# Patient Record
Sex: Female | Born: 1970 | Race: Black or African American | Hispanic: No | Marital: Single | Smoking: Never smoker
Health system: Southern US, Community
[De-identification: ages and names within clinical notes are randomized; demographics above are authoritative.]

## PROBLEM LIST (undated history)

## (undated) DIAGNOSIS — I10 Essential (primary) hypertension: Secondary | ICD-10-CM

## (undated) DIAGNOSIS — R102 Pelvic and perineal pain: Secondary | ICD-10-CM

## (undated) DIAGNOSIS — D219 Benign neoplasm of connective and other soft tissue, unspecified: Secondary | ICD-10-CM

## (undated) DIAGNOSIS — B009 Herpesviral infection, unspecified: Secondary | ICD-10-CM

## (undated) DIAGNOSIS — J189 Pneumonia, unspecified organism: Secondary | ICD-10-CM

## (undated) DIAGNOSIS — E119 Type 2 diabetes mellitus without complications: Secondary | ICD-10-CM

## (undated) DIAGNOSIS — N939 Abnormal uterine and vaginal bleeding, unspecified: Secondary | ICD-10-CM

## (undated) DIAGNOSIS — E039 Hypothyroidism, unspecified: Secondary | ICD-10-CM

## (undated) HISTORY — DX: Herpesviral infection, unspecified: B00.9

## (undated) HISTORY — PX: CRYOABLATION: SHX1415

---

## 1997-02-18 HISTORY — PX: TUBAL LIGATION: SHX77

## 1997-06-15 ENCOUNTER — Encounter: Admission: RE | Admit: 1997-06-15 | Discharge: 1997-06-15 | Payer: Self-pay | Admitting: Family Medicine

## 1998-04-06 ENCOUNTER — Encounter: Admission: RE | Admit: 1998-04-06 | Discharge: 1998-04-06 | Payer: Self-pay | Admitting: Family Medicine

## 1998-08-25 ENCOUNTER — Encounter: Admission: RE | Admit: 1998-08-25 | Discharge: 1998-08-25 | Payer: Self-pay | Admitting: Family Medicine

## 1998-10-10 ENCOUNTER — Encounter: Admission: RE | Admit: 1998-10-10 | Discharge: 1998-10-10 | Payer: Self-pay | Admitting: Family Medicine

## 1998-10-10 ENCOUNTER — Other Ambulatory Visit: Admission: RE | Admit: 1998-10-10 | Discharge: 1998-10-10 | Payer: Self-pay | Admitting: *Deleted

## 1999-02-05 ENCOUNTER — Encounter: Admission: RE | Admit: 1999-02-05 | Discharge: 1999-02-05 | Payer: Self-pay | Admitting: Family Medicine

## 1999-03-29 ENCOUNTER — Encounter: Admission: RE | Admit: 1999-03-29 | Discharge: 1999-03-29 | Payer: Self-pay | Admitting: Family Medicine

## 1999-04-04 ENCOUNTER — Encounter: Admission: RE | Admit: 1999-04-04 | Discharge: 1999-04-04 | Payer: Self-pay | Admitting: Family Medicine

## 1999-05-03 ENCOUNTER — Encounter: Admission: RE | Admit: 1999-05-03 | Discharge: 1999-05-03 | Payer: Self-pay | Admitting: Family Medicine

## 1999-05-09 ENCOUNTER — Encounter: Admission: RE | Admit: 1999-05-09 | Discharge: 1999-05-09 | Payer: Self-pay | Admitting: Family Medicine

## 2000-03-20 ENCOUNTER — Encounter: Admission: RE | Admit: 2000-03-20 | Discharge: 2000-03-20 | Payer: Self-pay | Admitting: Family Medicine

## 2000-04-25 ENCOUNTER — Encounter: Admission: RE | Admit: 2000-04-25 | Discharge: 2000-04-25 | Payer: Self-pay | Admitting: Family Medicine

## 2006-03-21 ENCOUNTER — Encounter (INDEPENDENT_AMBULATORY_CARE_PROVIDER_SITE_OTHER): Payer: Self-pay | Admitting: *Deleted

## 2006-03-21 LAB — CONVERTED CEMR LAB

## 2006-03-27 ENCOUNTER — Encounter (INDEPENDENT_AMBULATORY_CARE_PROVIDER_SITE_OTHER): Payer: Self-pay | Admitting: Family Medicine

## 2006-03-27 ENCOUNTER — Ambulatory Visit: Payer: Self-pay | Admitting: Family Medicine

## 2006-03-27 LAB — CONVERTED CEMR LAB
ALT: 10 units/L (ref 0–35)
Albumin: 4.2 g/dL (ref 3.5–5.2)
Alkaline Phosphatase: 50 units/L (ref 39–117)
CO2: 22 meq/L (ref 19–32)
Cholesterol: 150 mg/dL (ref 0–200)
GC Probe Amp, Genital: NEGATIVE
Glucose, Bld: 89 mg/dL (ref 70–99)
LDL Cholesterol: 92 mg/dL (ref 0–99)
Potassium: 4.1 meq/L (ref 3.5–5.3)
Sodium: 137 meq/L (ref 135–145)
Total Bilirubin: 0.2 mg/dL — ABNORMAL LOW (ref 0.3–1.2)
Total Protein: 6.7 g/dL (ref 6.0–8.3)
Triglycerides: 37 mg/dL (ref <150)
VLDL: 7 mg/dL (ref 0–40)

## 2006-04-17 DIAGNOSIS — E0500 Thyrotoxicosis with diffuse goiter without thyrotoxic crisis or storm: Secondary | ICD-10-CM | POA: Insufficient documentation

## 2006-04-18 ENCOUNTER — Encounter (INDEPENDENT_AMBULATORY_CARE_PROVIDER_SITE_OTHER): Payer: Self-pay | Admitting: *Deleted

## 2006-09-11 ENCOUNTER — Encounter: Admission: RE | Admit: 2006-09-11 | Discharge: 2006-09-11 | Payer: Self-pay | Admitting: Internal Medicine

## 2006-09-11 IMAGING — CR DG KNEE COMPLETE 4+V*R*
4 series · 4 of 4 positions shown · non-contrast
Comparison: none

HISTORY: Pain, fall

RIGHT KNEE 4 VIEWS:
Bone mineralization normal.
Joint spaces preserved.
No fracture, dislocation, or bone destruction.
No knee joint effusion seen.
Soft tissues unremarkable.

[view not recorded (1 of 4)]
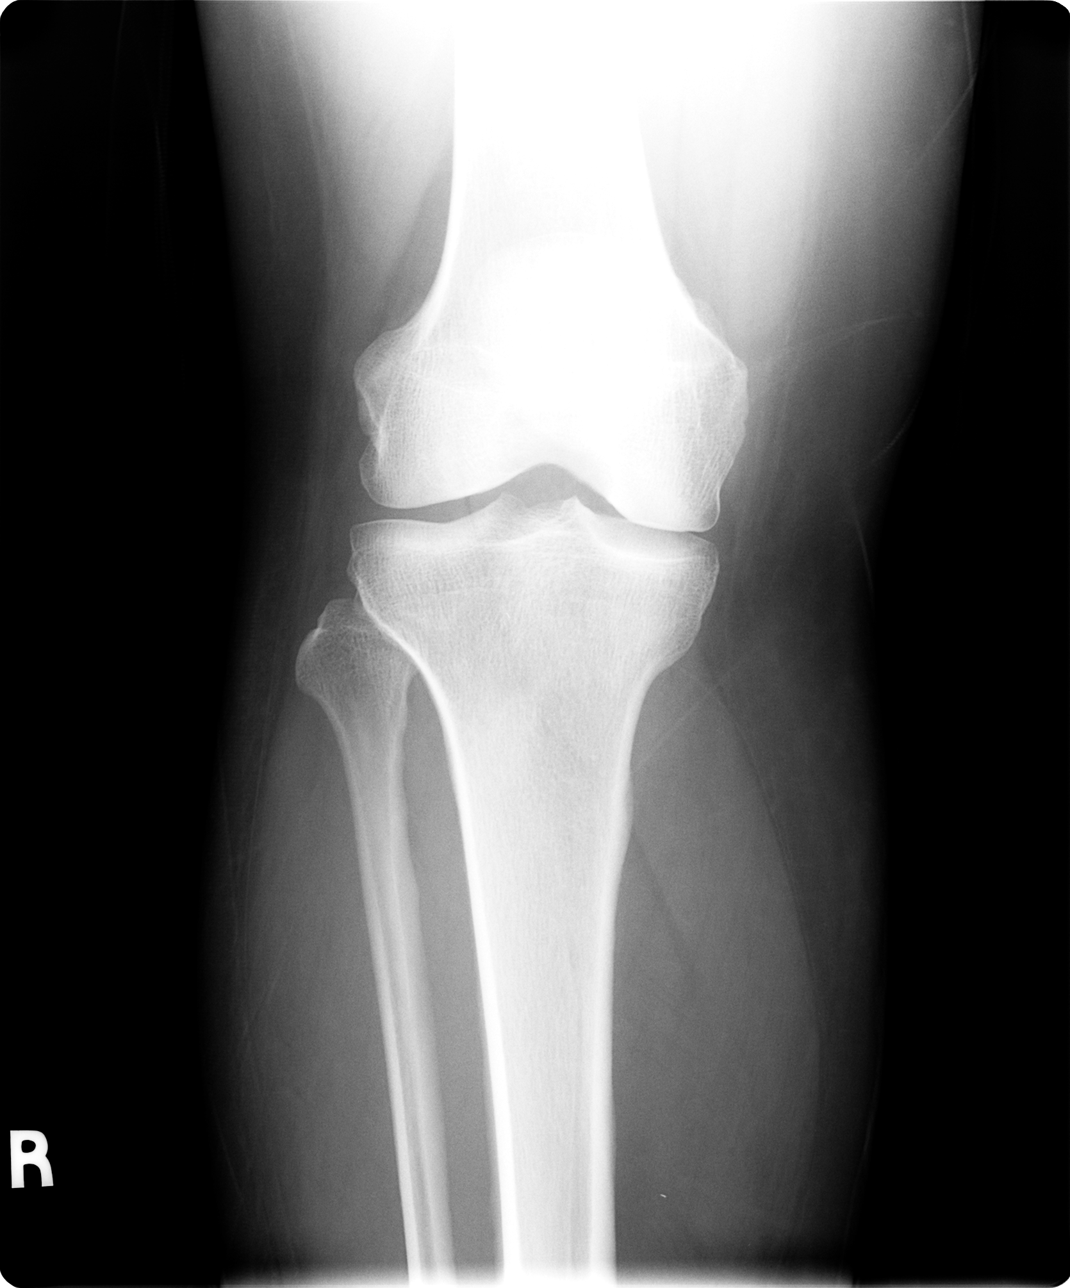

[view not recorded (2 of 4)]
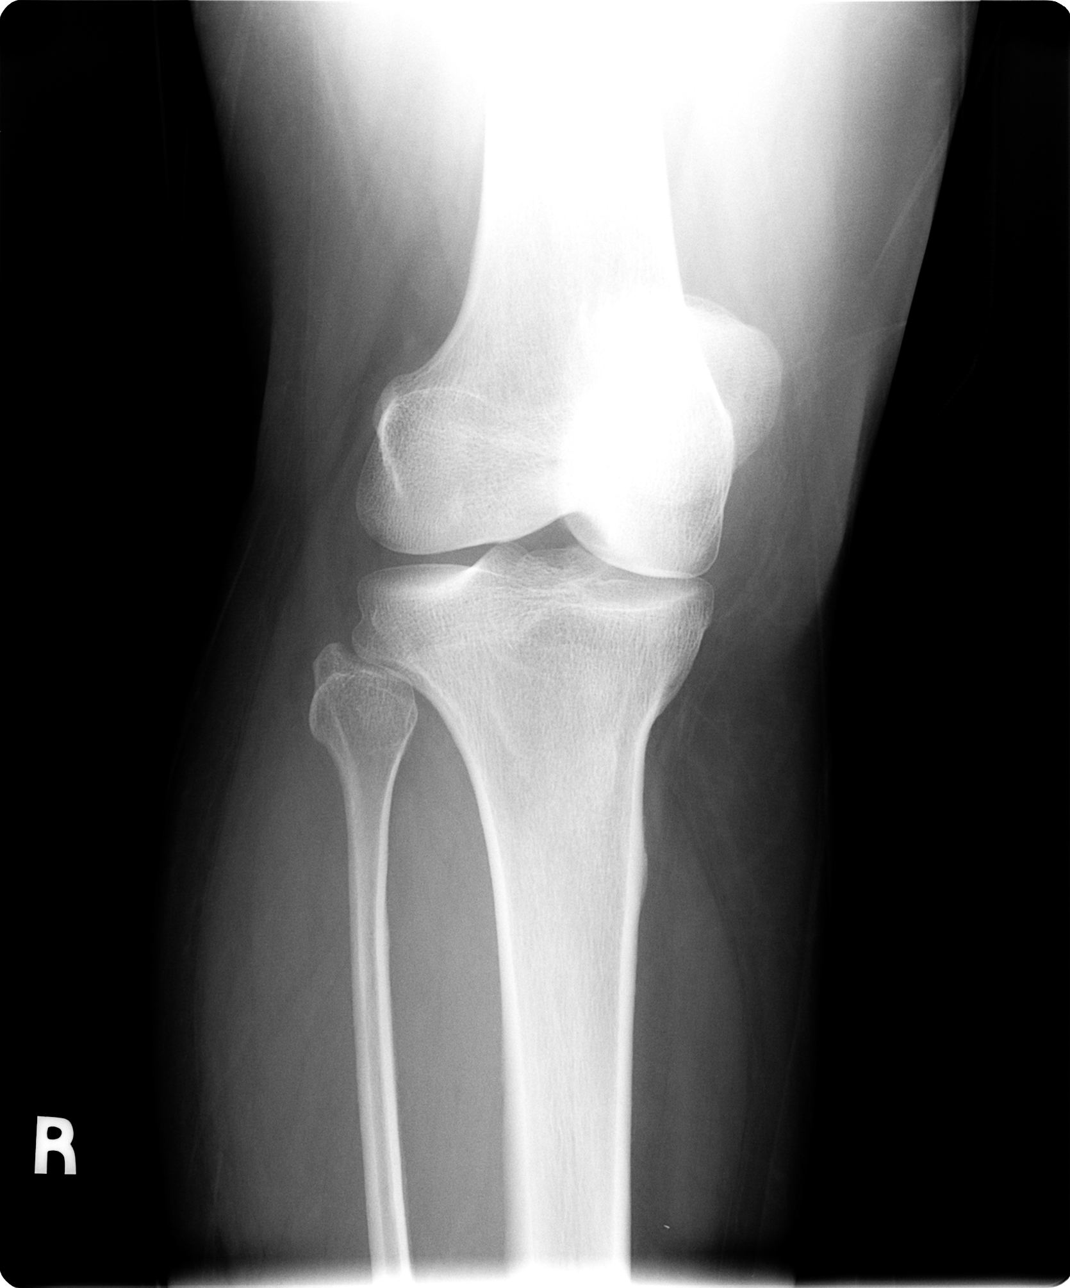

[view not recorded (3 of 4)]
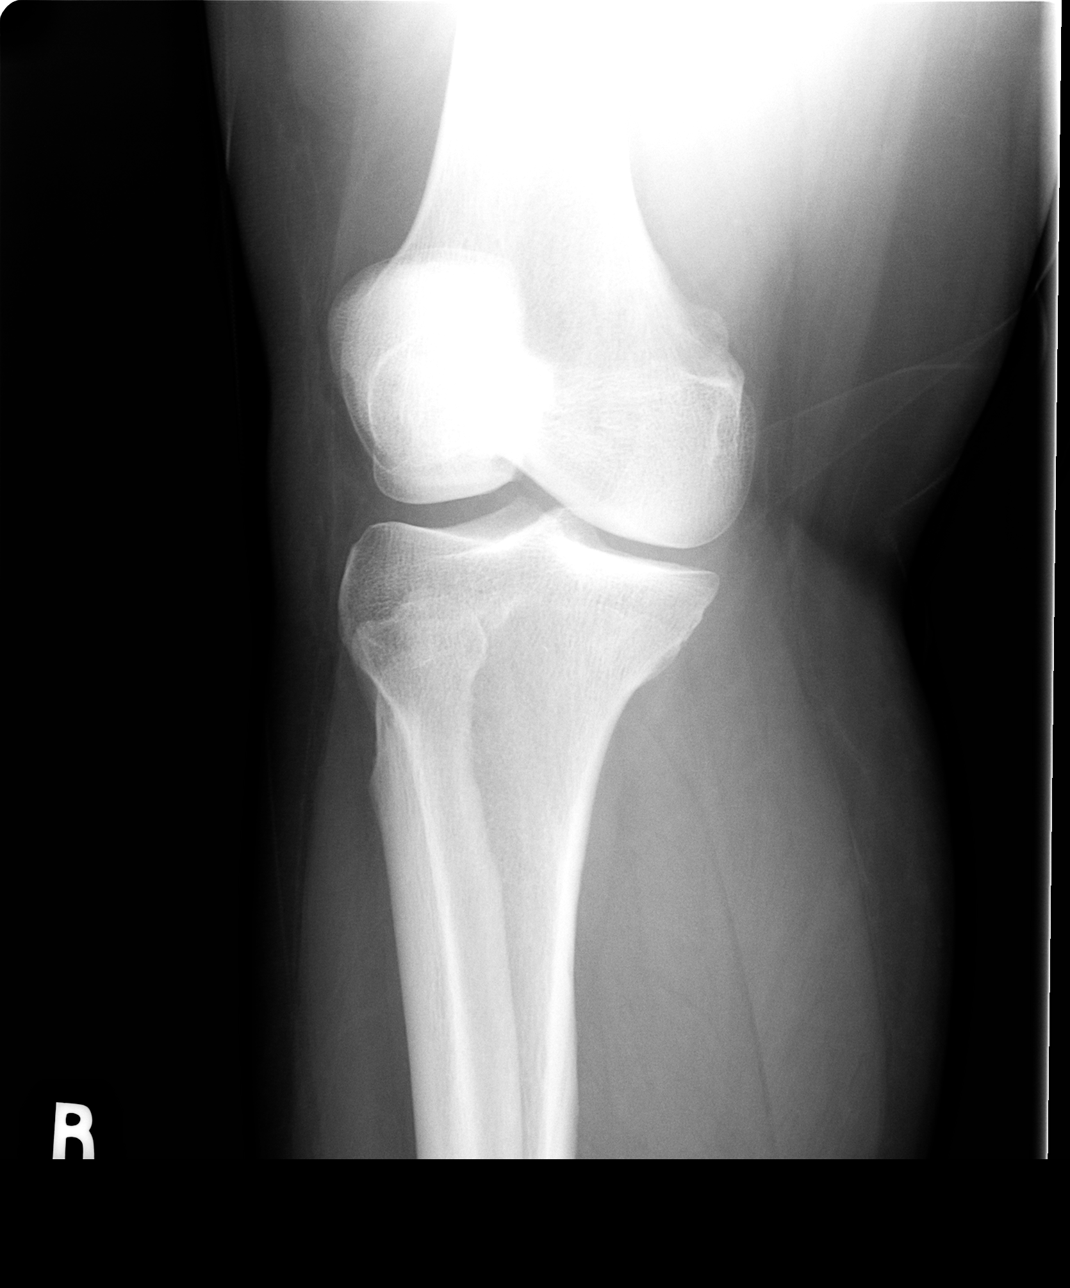

[view not recorded (4 of 4)]
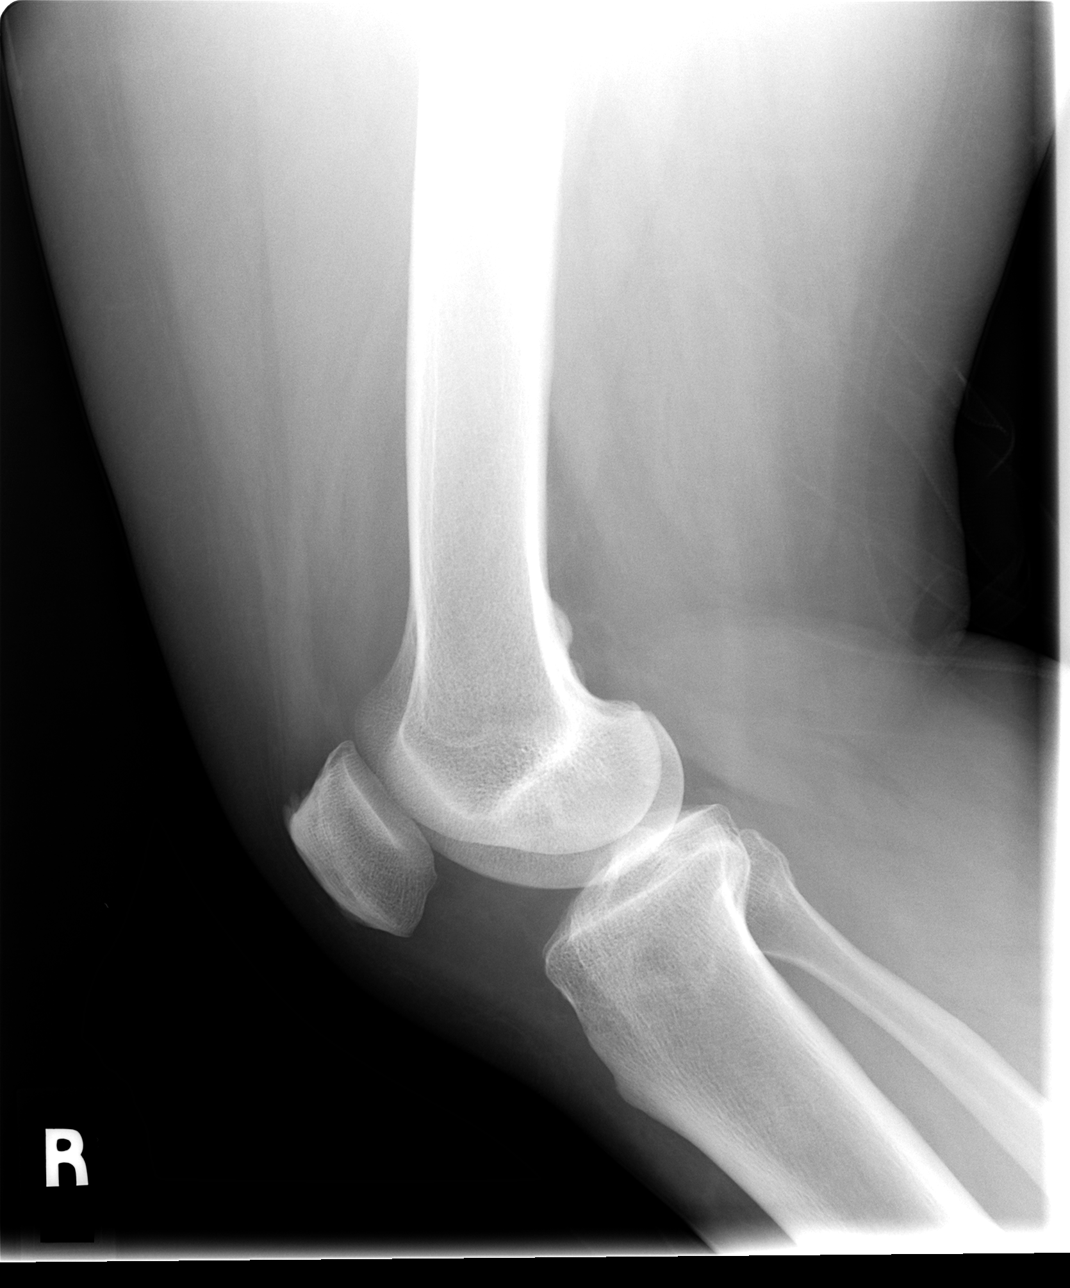

[4 of 4 positions shown; findings below may reference images not displayed]

IMPRESSION: No acute abnormalities.

## 2006-10-13 ENCOUNTER — Ambulatory Visit (HOSPITAL_COMMUNITY): Admission: RE | Admit: 2006-10-13 | Discharge: 2006-10-13 | Payer: Self-pay | Admitting: Family Medicine

## 2006-10-13 ENCOUNTER — Ambulatory Visit: Payer: Self-pay | Admitting: Family Medicine

## 2006-10-13 ENCOUNTER — Telehealth: Payer: Self-pay | Admitting: *Deleted

## 2006-10-13 DIAGNOSIS — R0789 Other chest pain: Secondary | ICD-10-CM | POA: Insufficient documentation

## 2006-10-14 ENCOUNTER — Encounter: Payer: Self-pay | Admitting: Family Medicine

## 2006-10-25 ENCOUNTER — Emergency Department (HOSPITAL_COMMUNITY): Admission: EM | Admit: 2006-10-25 | Discharge: 2006-10-25 | Payer: Self-pay | Admitting: Family Medicine

## 2006-10-25 IMAGING — CR DG ORBITS COMPLETE 4+V
3 series · 3 of 3 positions shown · non-contrast
Comparison: none

CLINICAL DATA: Punched and left

Exam: Orbits:

[view not recorded (1 of 3)]
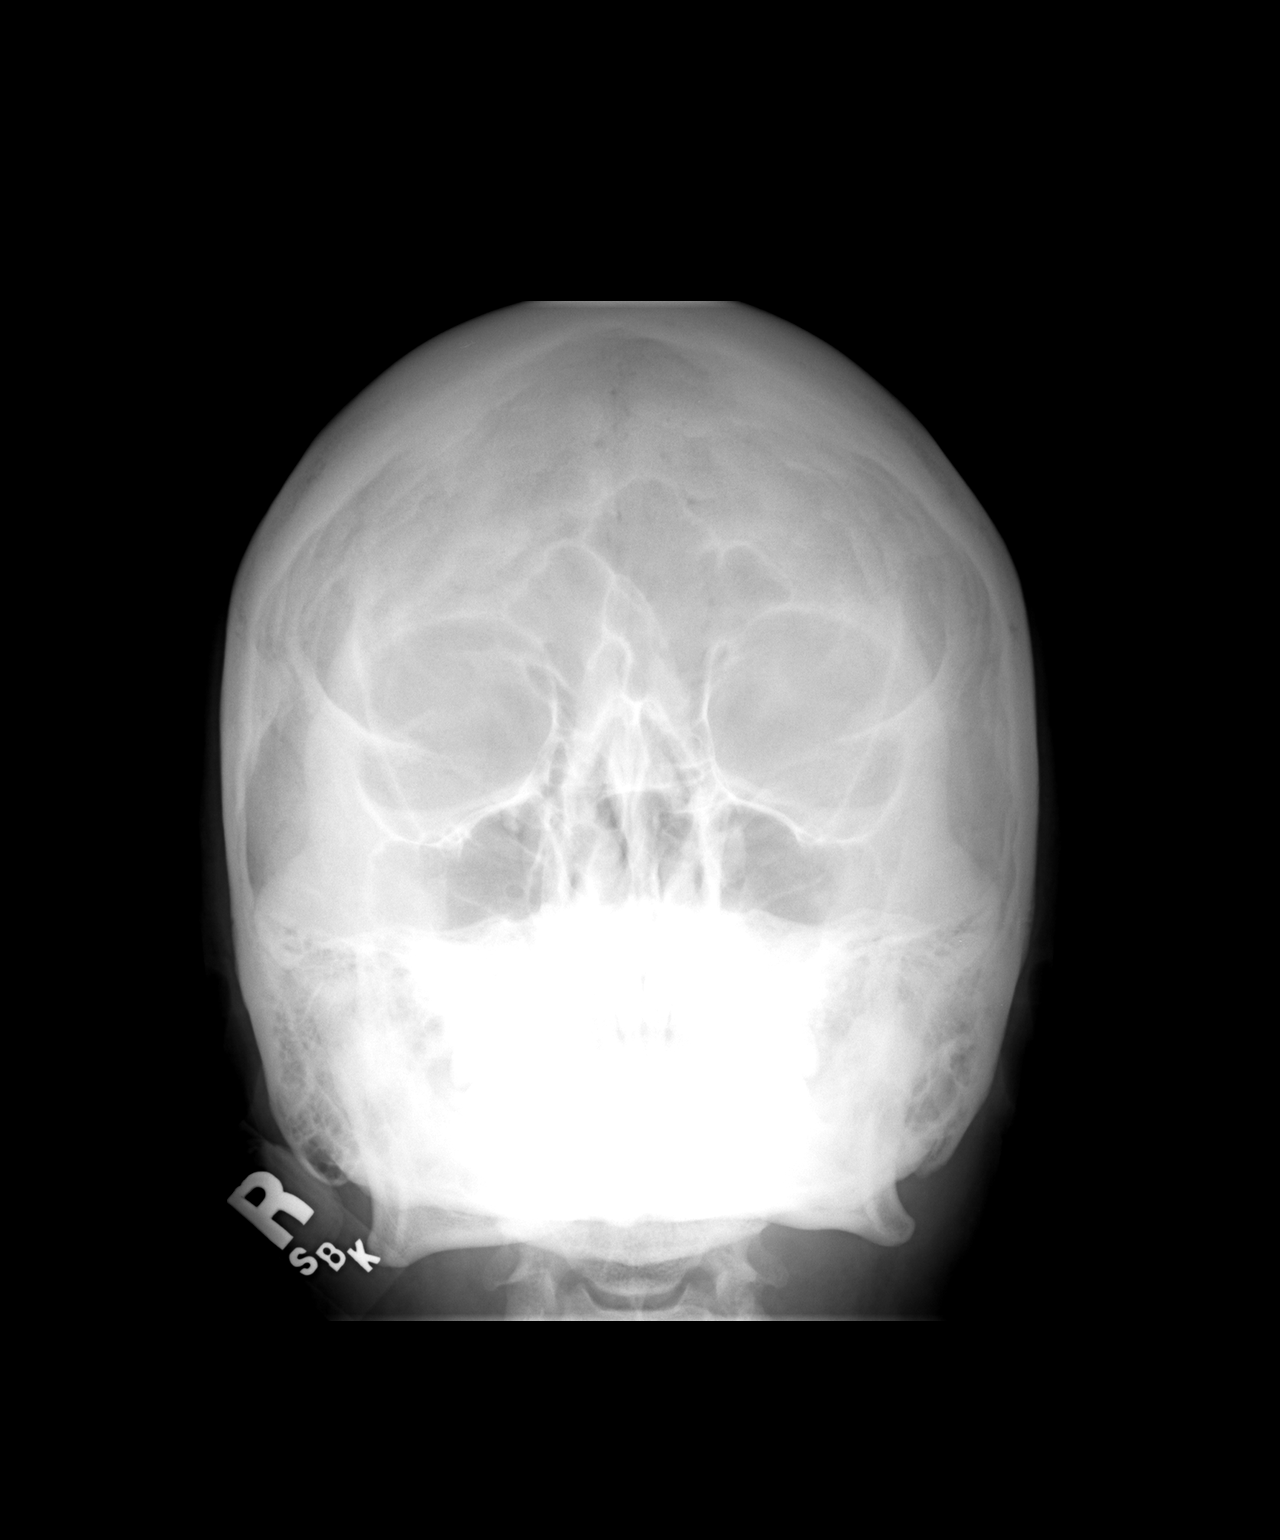

[view not recorded (2 of 3)]
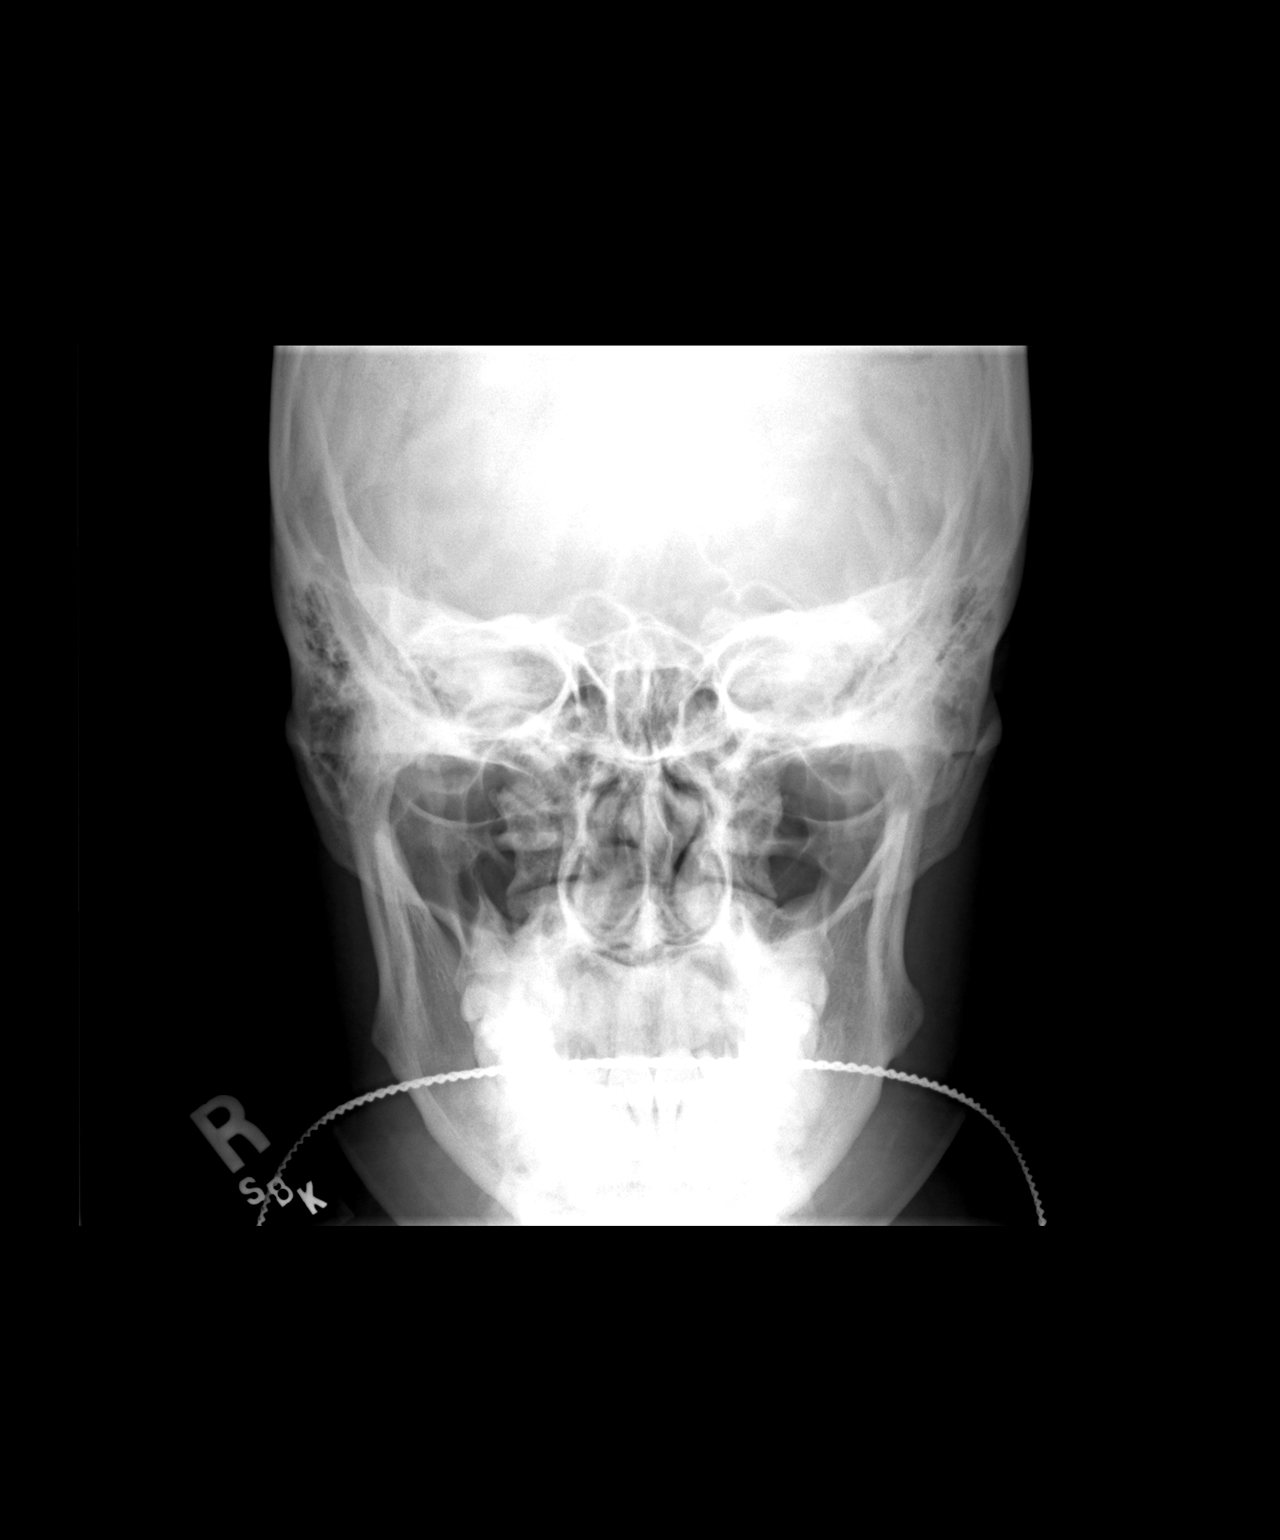

[view not recorded (3 of 3)]
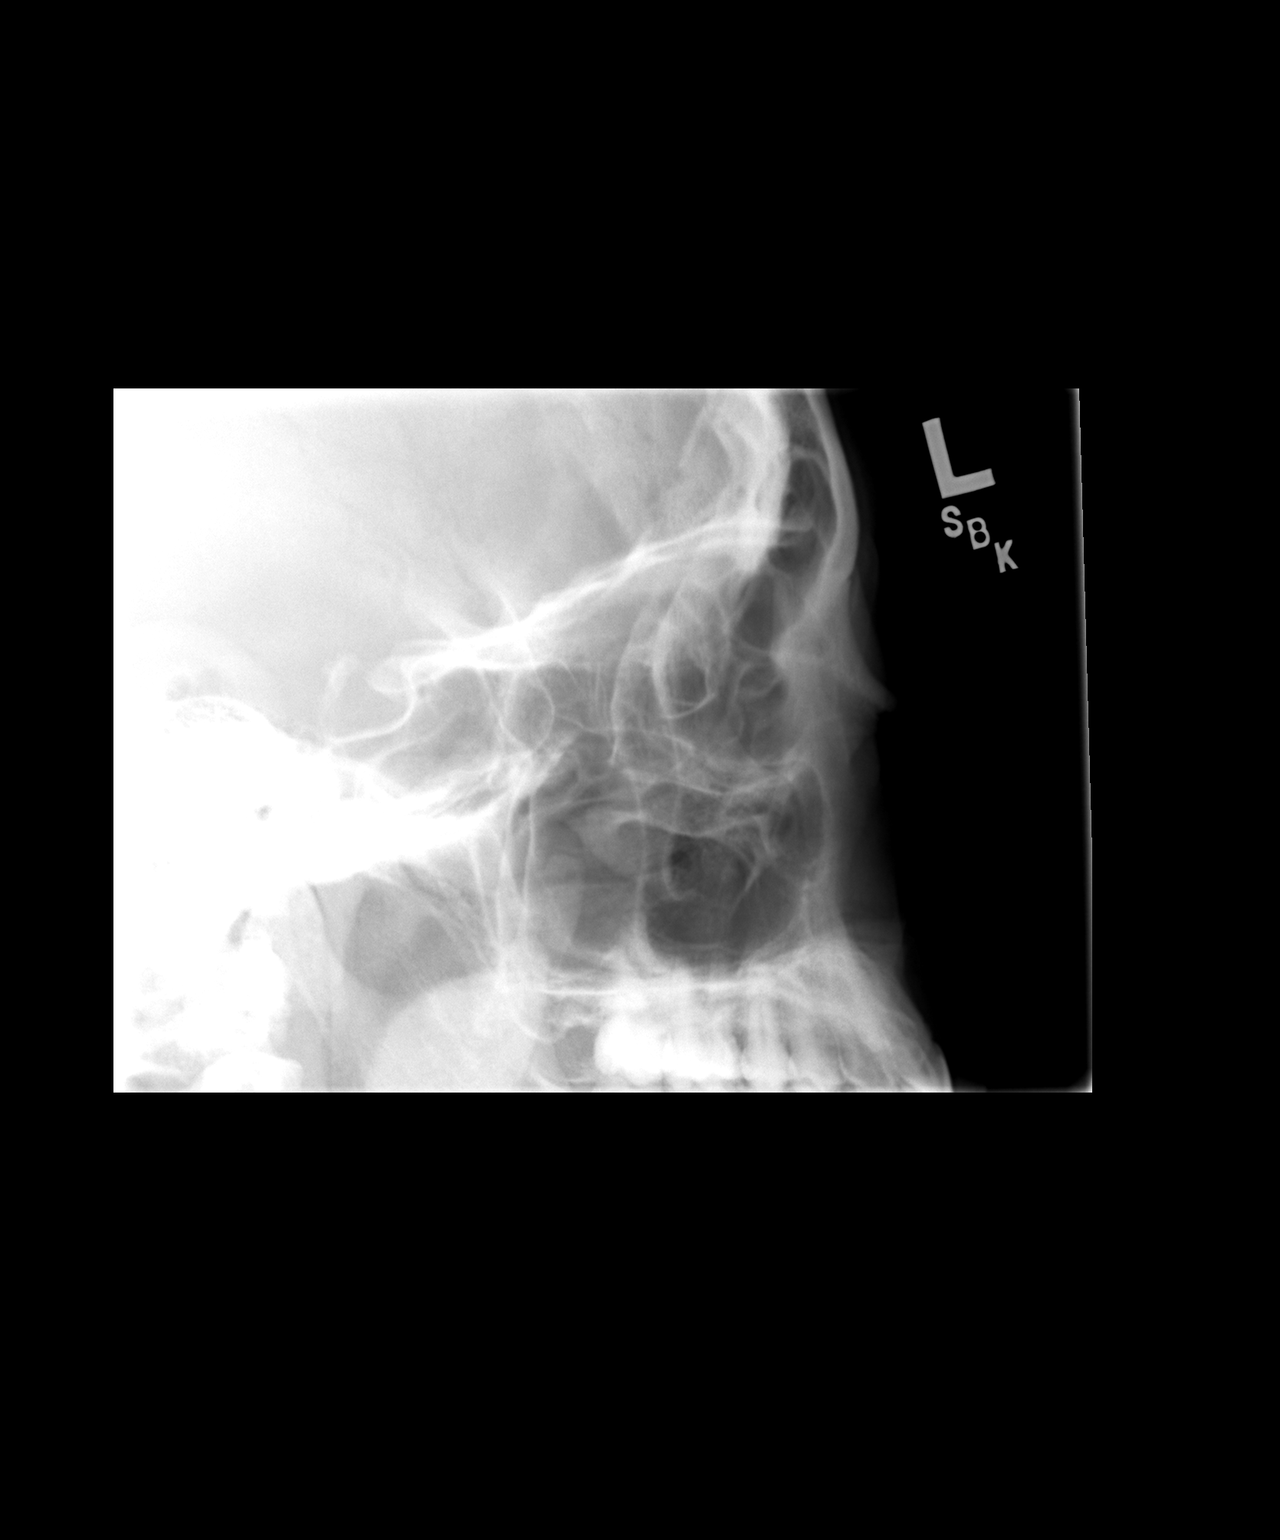

[3 of 3 positions shown; findings below may reference images not displayed]

FINDINGS: 3 views of the orbit demonstrate no evidence of fracture or
dislocation. No evidence of fluid within the paranasal sinuses.
IMPRESSION: 1. No evidence of fracture.

## 2007-12-16 ENCOUNTER — Encounter: Payer: Self-pay | Admitting: Family Medicine

## 2007-12-21 ENCOUNTER — Encounter: Payer: Self-pay | Admitting: Family Medicine

## 2007-12-21 ENCOUNTER — Ambulatory Visit: Payer: Self-pay | Admitting: Family Medicine

## 2007-12-21 DIAGNOSIS — E669 Obesity, unspecified: Secondary | ICD-10-CM | POA: Insufficient documentation

## 2007-12-21 DIAGNOSIS — I10 Essential (primary) hypertension: Secondary | ICD-10-CM | POA: Insufficient documentation

## 2007-12-21 LAB — CONVERTED CEMR LAB
ALT: <8 units/L (ref 0–35)
Albumin: 4.2 g/dL (ref 3.5–5.2)
Alkaline Phosphatase: 38 units/L — ABNORMAL LOW (ref 39–117)
Glucose, Bld: 100 mg/dL — ABNORMAL HIGH (ref 70–99)
LDL Cholesterol: 86 mg/dL (ref 0–99)
Potassium: 3.7 meq/L (ref 3.5–5.3)
Sodium: 135 meq/L (ref 135–145)
Total Bilirubin: 0.3 mg/dL (ref 0.3–1.2)
Total Protein: 6.9 g/dL (ref 6.0–8.3)
VLDL: 16 mg/dL (ref 0–40)

## 2007-12-28 ENCOUNTER — Encounter: Payer: Self-pay | Admitting: Family Medicine

## 2008-09-13 ENCOUNTER — Telehealth: Payer: Self-pay | Admitting: Family Medicine

## 2008-10-20 ENCOUNTER — Encounter: Payer: Self-pay | Admitting: Family Medicine

## 2008-10-20 ENCOUNTER — Ambulatory Visit: Payer: Self-pay | Admitting: Family Medicine

## 2008-10-20 LAB — CONVERTED CEMR LAB
CO2: 23 meq/L (ref 19–32)
Chloride: 106 meq/L (ref 96–112)
Potassium: 3.9 meq/L (ref 3.5–5.3)
Sodium: 138 meq/L (ref 135–145)

## 2008-12-13 ENCOUNTER — Encounter: Admission: RE | Admit: 2008-12-13 | Discharge: 2008-12-13 | Payer: Self-pay | Admitting: *Deleted

## 2008-12-13 IMAGING — MG MM SCREEN MAMMOGRAM BILATERAL
5 series · 5 of 5 positions shown · non-contrast
Comparison: none

DG SCREEN MAMMOGRAM BILATERAL
Bilateral CC and MLO view(s) were taken.

DIGITAL SCREENING MAMMOGRAM WITH CAD:
The breast tissue is heterogeneously dense.  There is no dominant mass, architectural distortion or
calcification to suggest malignancy.
Images were processed with CAD.

[R CC]
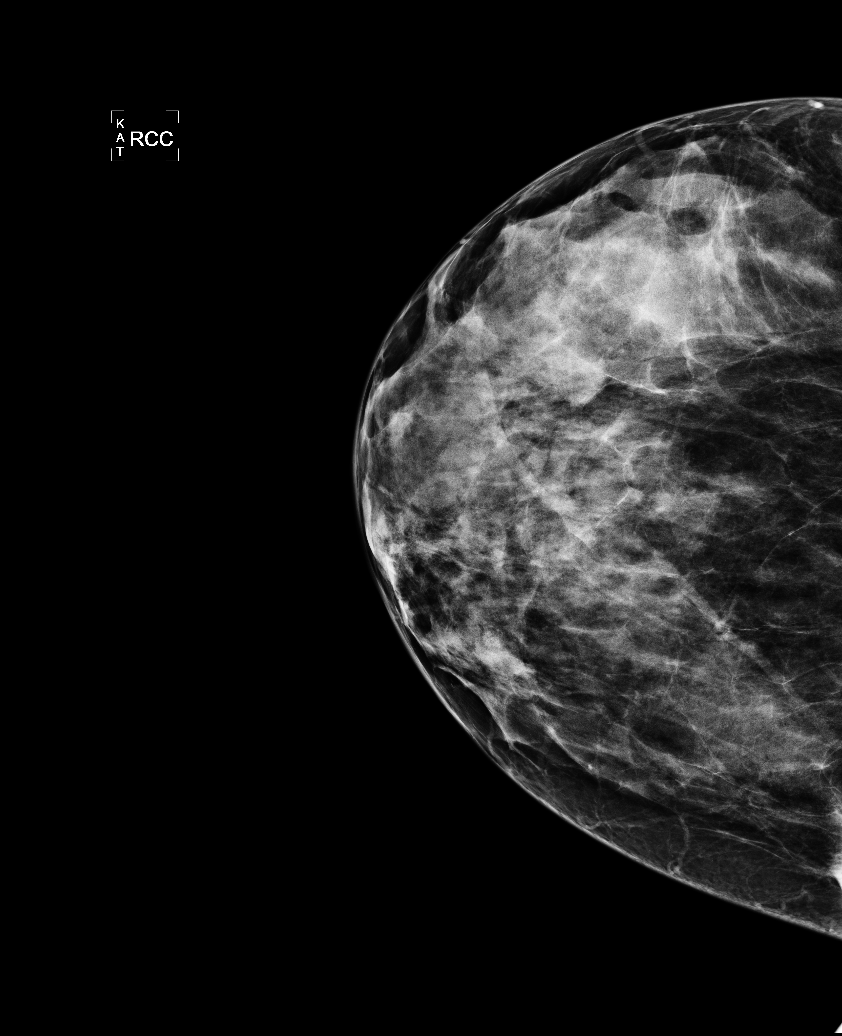

[L CC]
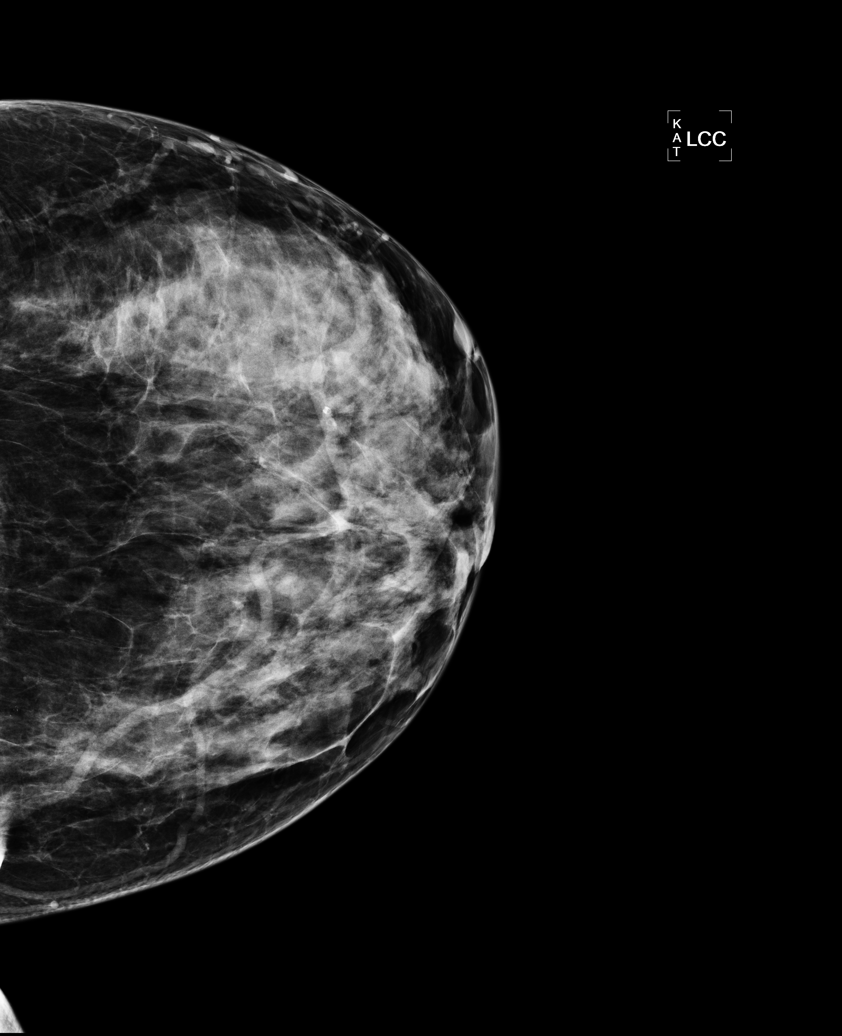

[L MLO]
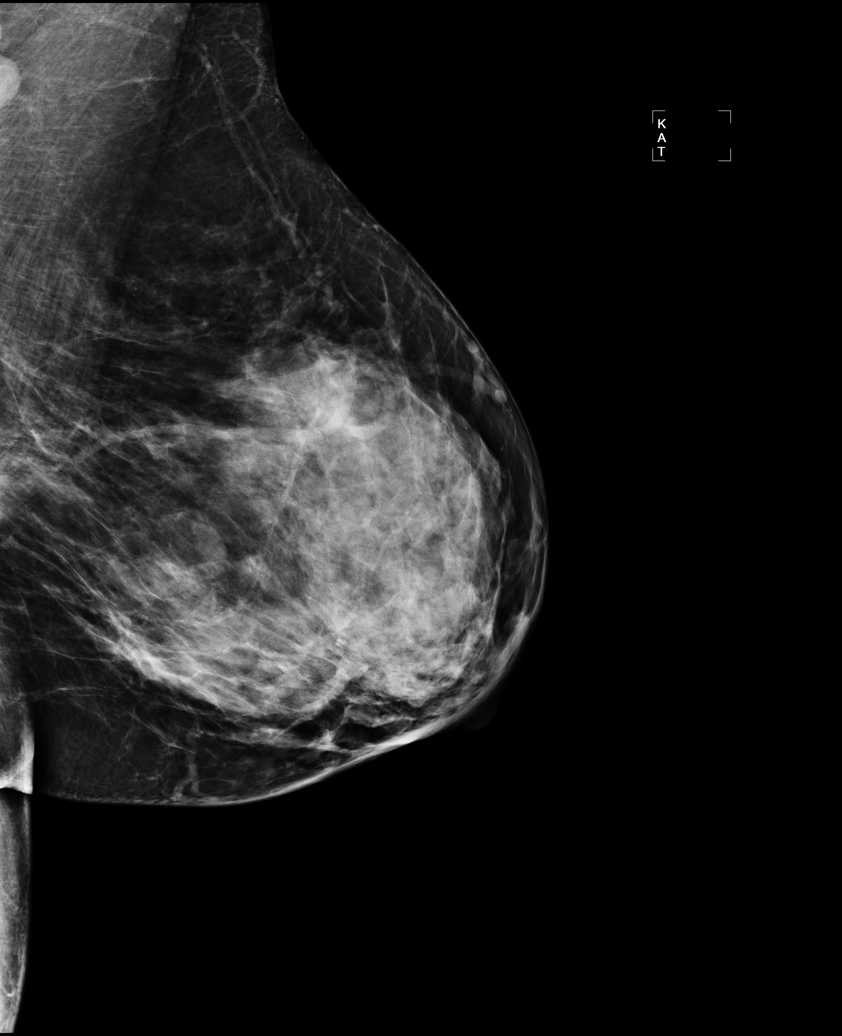

[R MLO (1 of 2)]
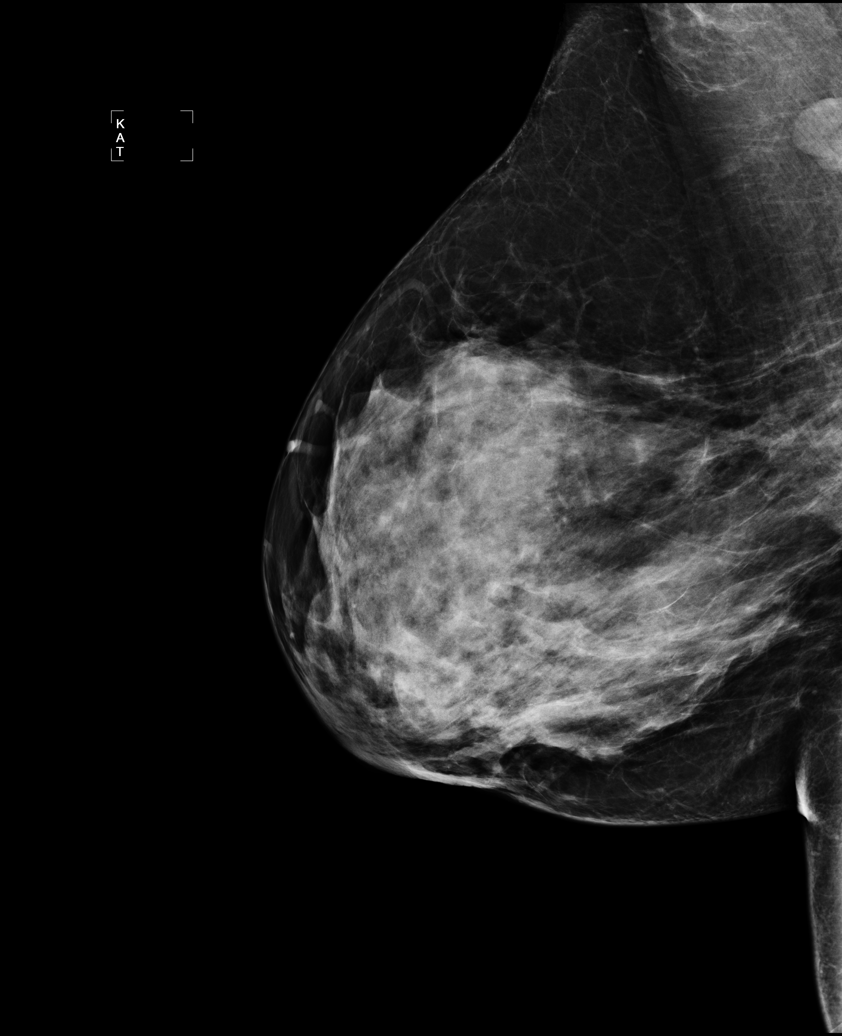

[R MLO (2 of 2)]
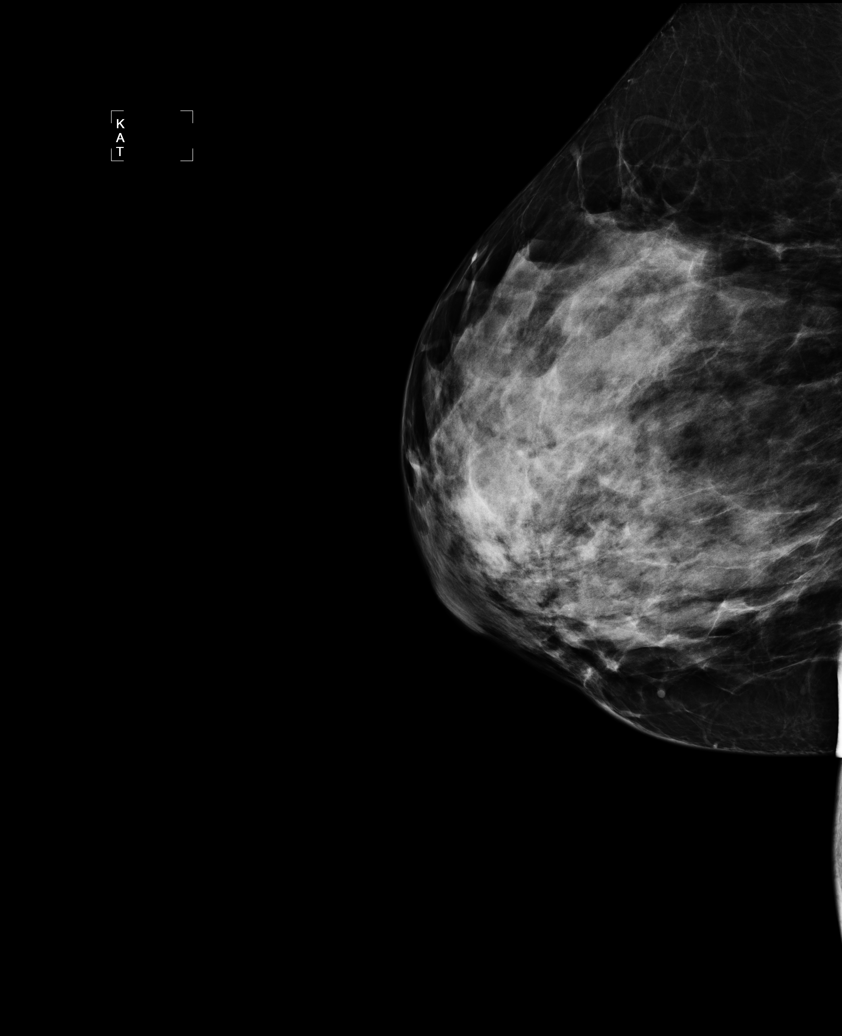

[5 of 5 positions shown; findings below may reference images not displayed]

IMPRESSION: No mammographic evidence of malignancy.  Suggest yearly screening mammography begin at age 40.

A result letter of this screening mammogram will be mailed directly to the patient.

ASSESSMENT: Negative - BI-RADS 1

Screening mammogram of both breasts at age 40.
,

## 2009-01-02 ENCOUNTER — Telehealth: Payer: Self-pay | Admitting: Family Medicine

## 2009-02-18 DIAGNOSIS — B009 Herpesviral infection, unspecified: Secondary | ICD-10-CM

## 2009-02-18 HISTORY — DX: Herpesviral infection, unspecified: B00.9

## 2009-11-14 ENCOUNTER — Ambulatory Visit: Payer: Self-pay | Admitting: Family Medicine

## 2009-11-14 DIAGNOSIS — N6320 Unspecified lump in the left breast, unspecified quadrant: Secondary | ICD-10-CM | POA: Insufficient documentation

## 2009-11-15 ENCOUNTER — Encounter: Admission: RE | Admit: 2009-11-15 | Discharge: 2009-11-15 | Payer: Self-pay | Admitting: Family Medicine

## 2009-11-15 IMAGING — MG MM DIGITAL DIAGNOSTIC BILAT
7 series · 7 of 7 positions shown · non-contrast
Comparison: [DATE]

CLINICAL DATA: Palpable abnormalities in both breasts.

DIGITAL DIAGNOSTIC BILATERAL MAMMOGRAM CAD AND BILATERAL BREAST
ULTRASOUND:

[R CC]
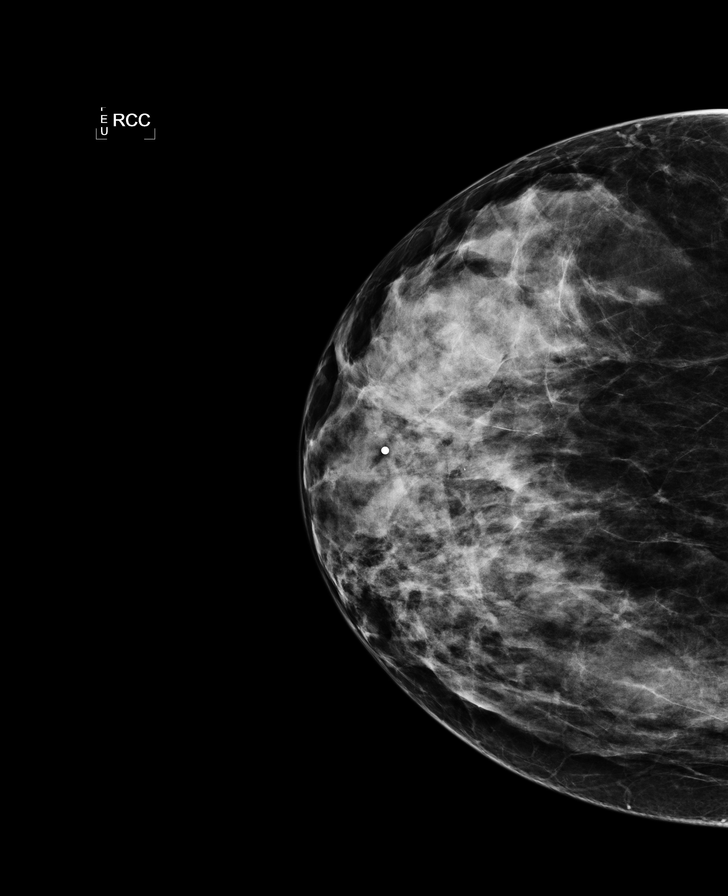

[L CC (1 of 2)]
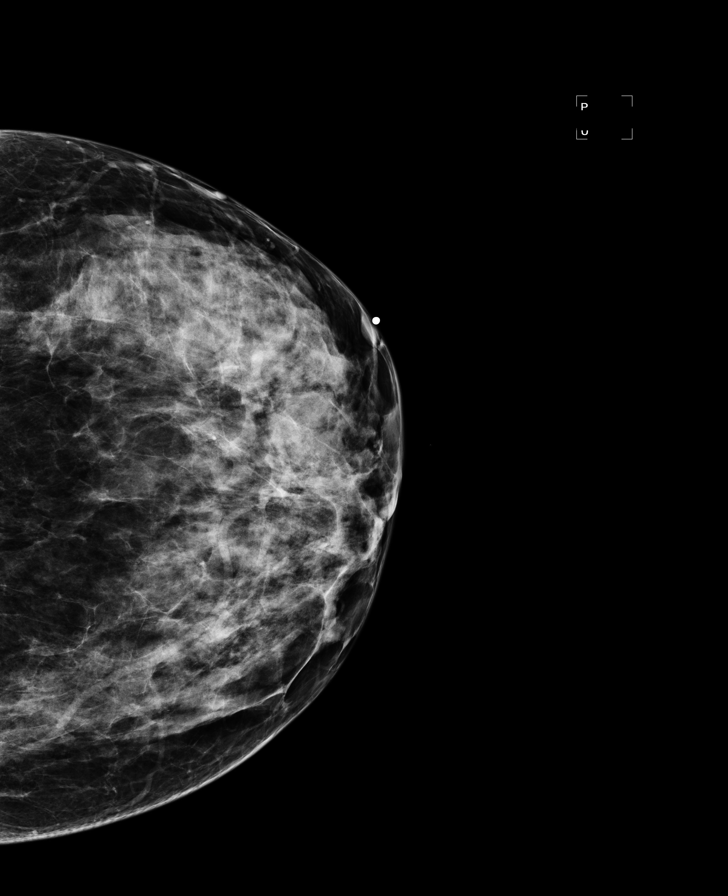

[L MLO]
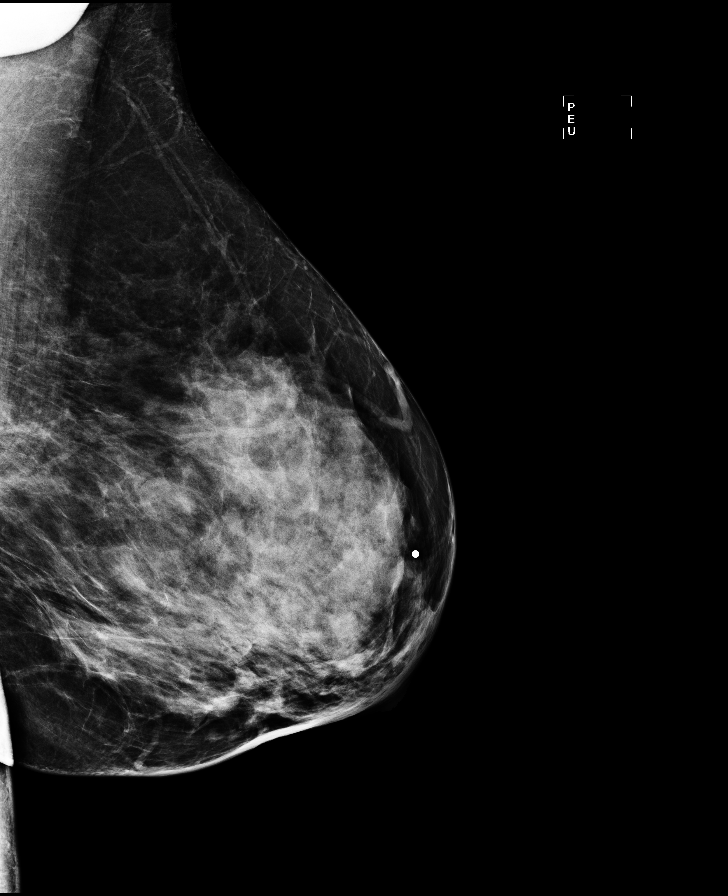

[R MLO]
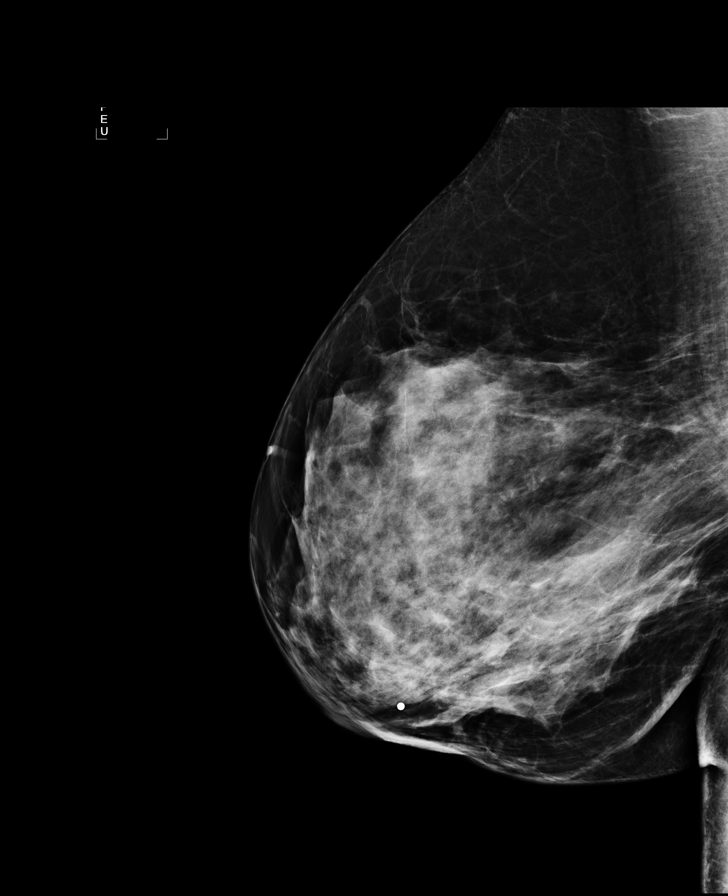

[L CC (2 of 2)]
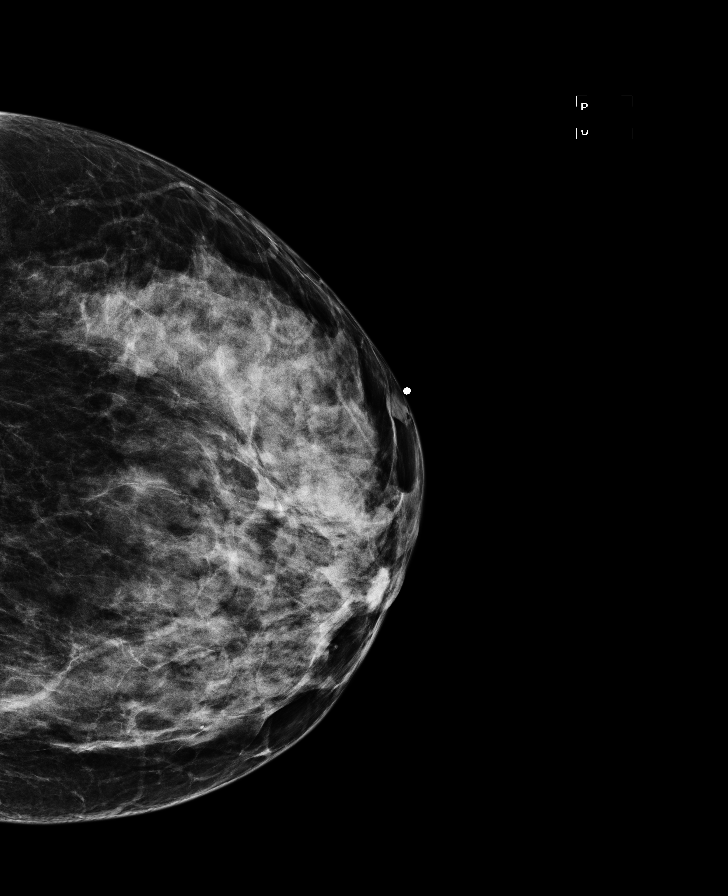

[L TAN]
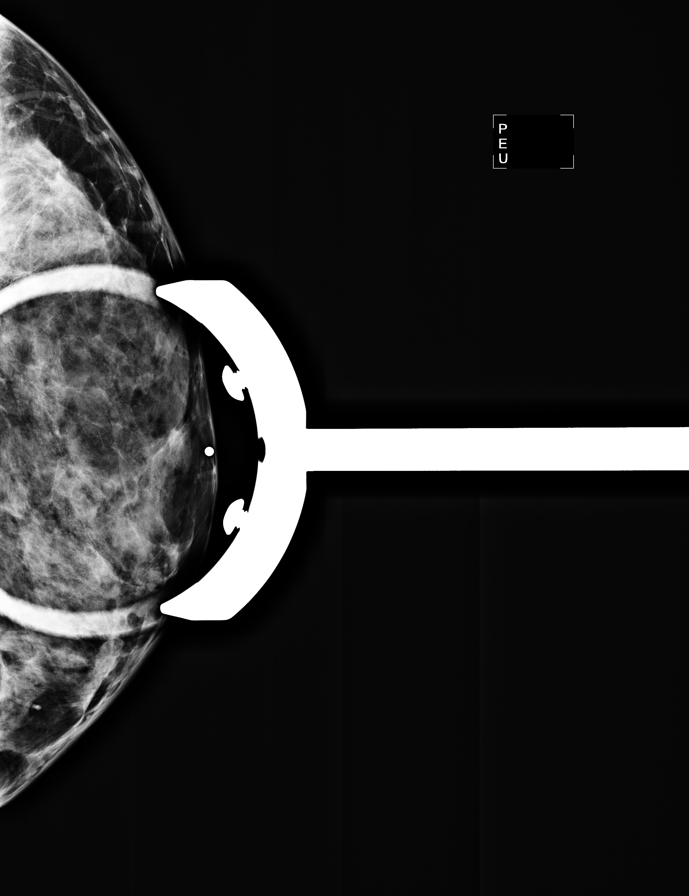

[R TAN]
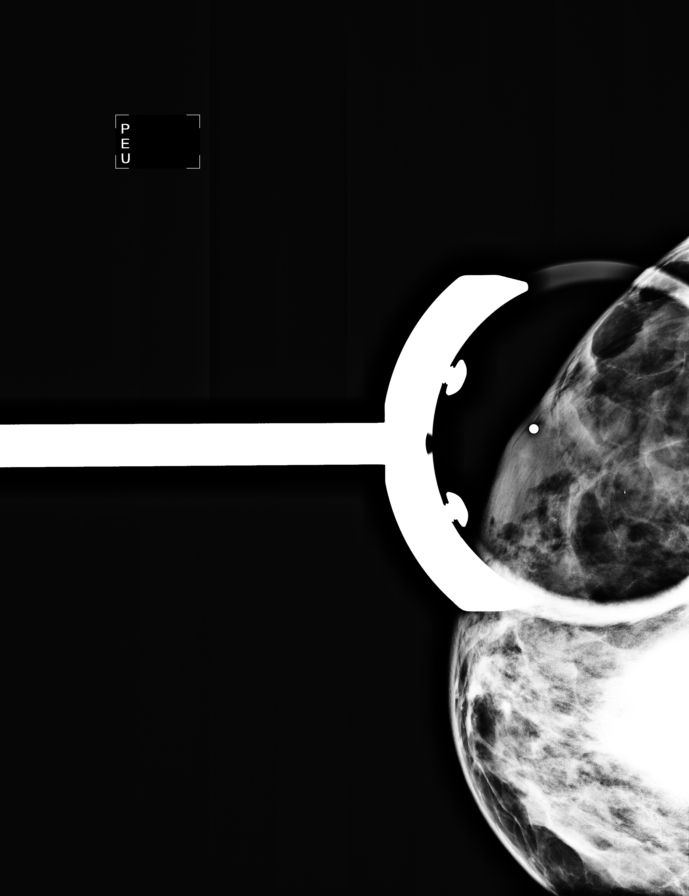

[7 of 7 positions shown; findings below may reference images not displayed]

FINDINGS: Breast parenchyma is heterogeneously dense. Spot
tangential views are performed of the areas in question
bilaterally.  On the left, there is focal superficial thickening or
subcutaneous nodule.  On the right, no focal abnormality is
identified.  No suspicious microcalcifications or distortion.
Mammographic images were processed with CAD.

On physical exam, I palpate a superficial nodule just lateral to
the areola in the 2 o'clock position of the left breast 4 cm from
the nipple.  In the right breast, I palpate a "BB size" nodule in
the 8 o'clock location of the subareolar breast.

Ultrasound is performed, showing a superficial hypoechoic
circumscribed nodule in the 2 o'clock location of the left breast 4
cm from the nipple just beyond the areolar margin.  Deep to this,
there is a simple cyst measuring 1.7 x 1.1 x 2.3 cm.  In addition,
there is a circumscribed hypo echoic nodule in the same location
measuring 1.1 x 0.6 x 1.3 cm.

On the right, dense parenchymal tissue is imaged in the subareolar
region in the area of concern.  No mass, architectural distortion,
or acoustic shadowing identified.
IMPRESSION: 1.  Palpable abnormality corresponds to a superficial hypoechoic
subdermal nodule.  Findings are most consistent with a
fibroadenoma. Deep to the palpable abnormality is a simple cyst.
An adjacent probable fibroadenoma is also imaged.

We discussed the options of excision, ultrasound guided core
biopsy, or close follow-up.  Close follow-up is recommended.  The
patient's mother was diagnosed with breast cancer several years
following diagnosis of a benign cyst.  Therefore, the patient is
concerned regarding the possibility of malignancy and desires
biopsy.  We discussed ultrasound-guided core biopsy of the
superficial, palpable nodule.  This has been scheduled at the
patient's convenience for [REDACTED] [DATE]. If biopsy of this
nodule shows fibroadenoma then ultrasound follow-up of the
incidental finding is suggested in 6 months.

2.  Palpable abnormality in the right corresponds to normal-
appearing fibroglandular tissue.

BI-RADS CATEGORY 3:  Probably benign finding(s) - short interval
follow-up suggested.

## 2009-11-20 ENCOUNTER — Encounter: Admission: RE | Admit: 2009-11-20 | Discharge: 2009-11-20 | Payer: Self-pay | Admitting: Family Medicine

## 2009-11-20 HISTORY — PX: BREAST BIOPSY: SHX20

## 2009-11-20 IMAGING — MG MM DIAGNOSTIC UNILATERAL L
2 series · 2 of 2 positions shown · non-contrast
Comparison: With priors

CLINICAL DATA: Status post ultrasound-guided core biopsy of the
left breast

DIGITAL DIAGNOSTIC LEFT MAMMOGRAM

[L CC]
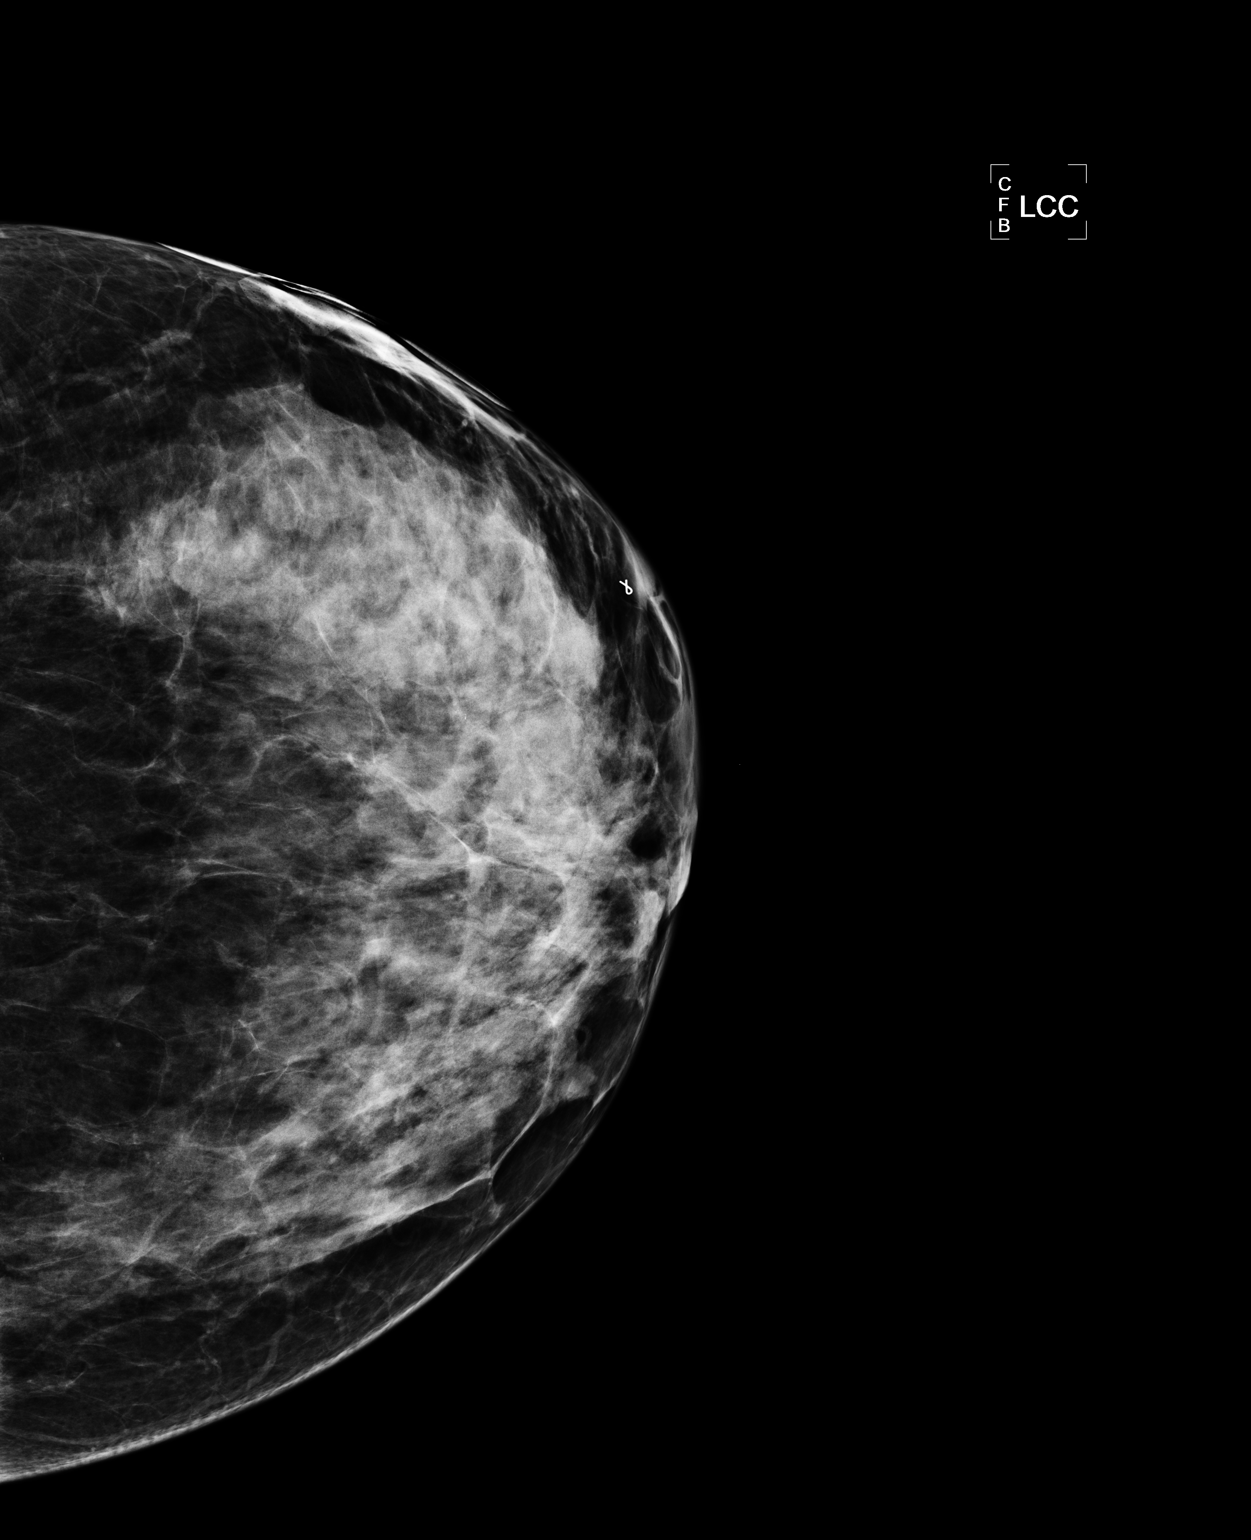

[L ML]
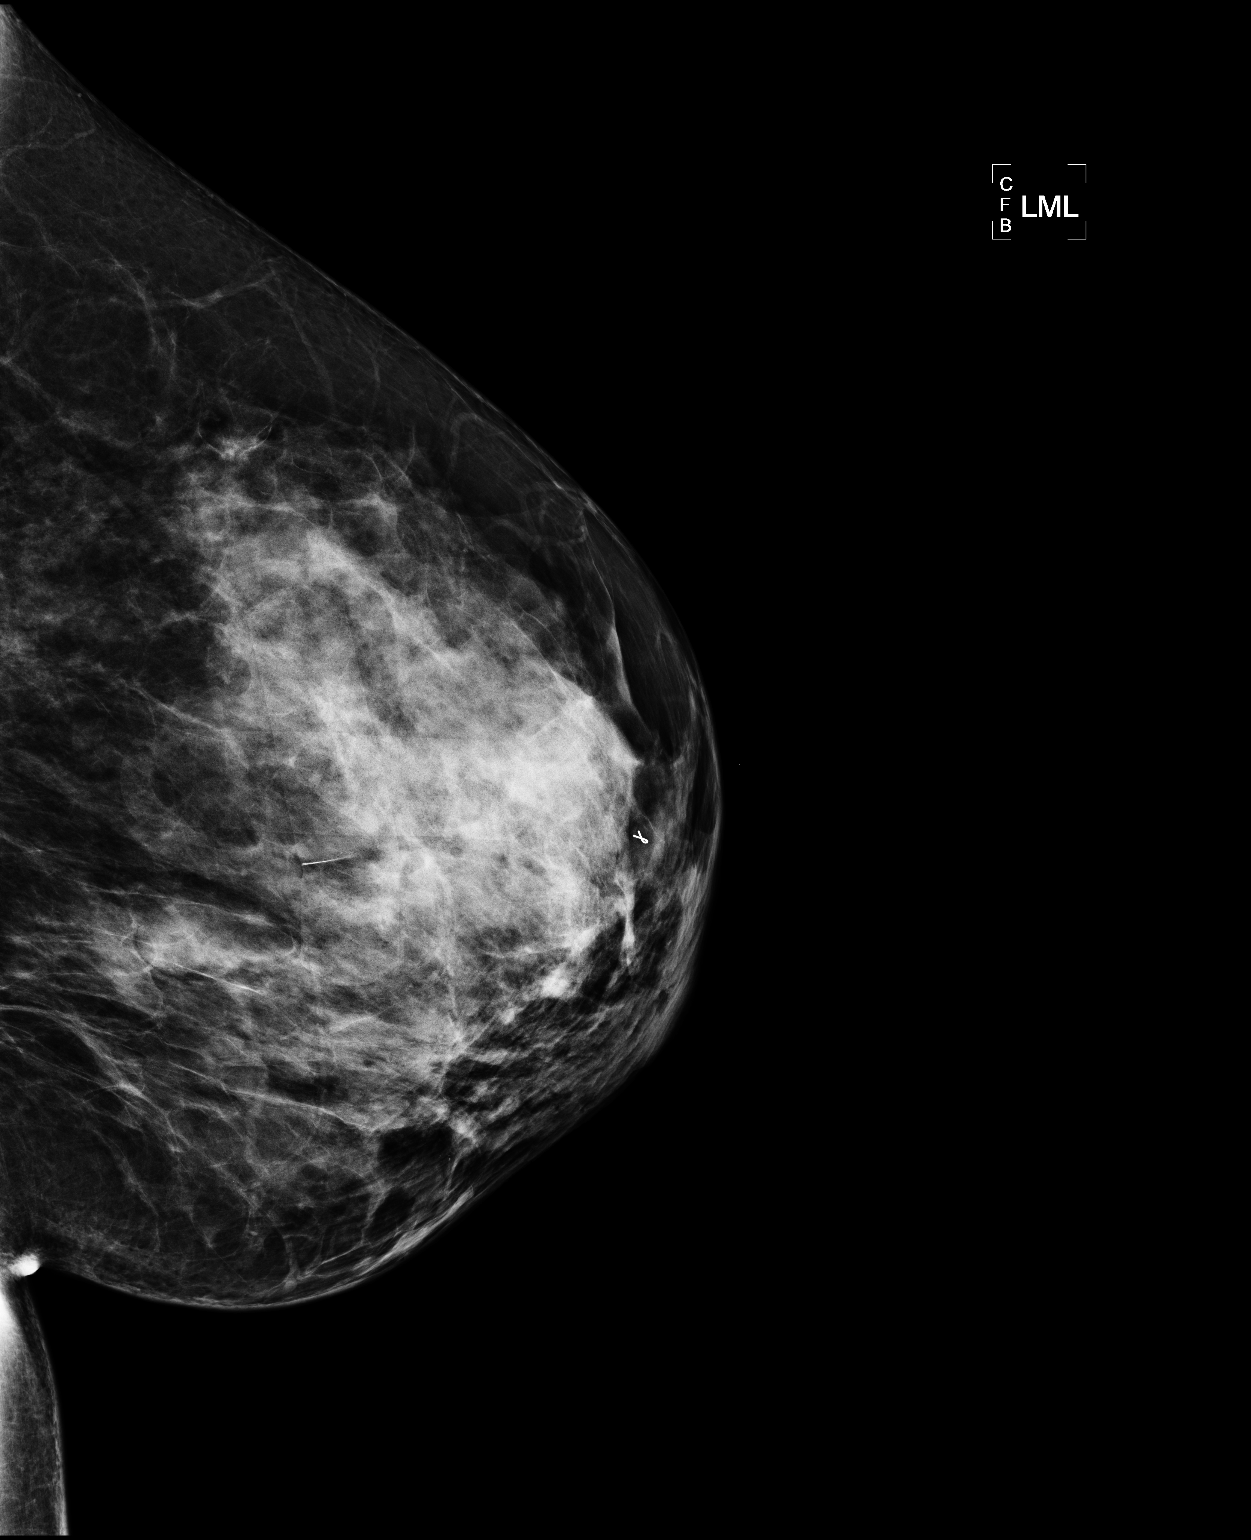

[2 of 2 positions shown; findings below may reference images not displayed]

FINDINGS: Films are performed following ultrasound guided biopsy
of the 2 o'clock region of the left breast.  Mammographic images
demonstrate there is a ribbon shaped [employer] clip in the lateral
aspect of the left breast.
IMPRESSION: Status post ultrasound-guided core biopsy of the left breast with
pathology pending.

## 2009-11-20 IMAGING — US US CORE BIOPSY
1 series · 7 of 7 positions shown · non-contrast
Comparison: none

Addendum Begins

Pathology revealed a tubular adenoma in the left breast. This was
found to be concordant by Dr. ALSEIKIENE. Pathology was relayed
by telephone. The patient reported doing well after the biopsy and
her questions were answered. Post biopsy instructions were reviewed
and she was encouraged to call [REDACTED] for any additional concerns. She was asked to return in 6
months for a left breast ultrasound.
Pathology results are dictated by ALSEIKIENE RN, BSN on ALSEIKIENE
Addendum Ends
CLINICAL DATA: Left breast mass

[Series 1: us core biopsy · 7 of 7 slices shown]
[im 1/7]
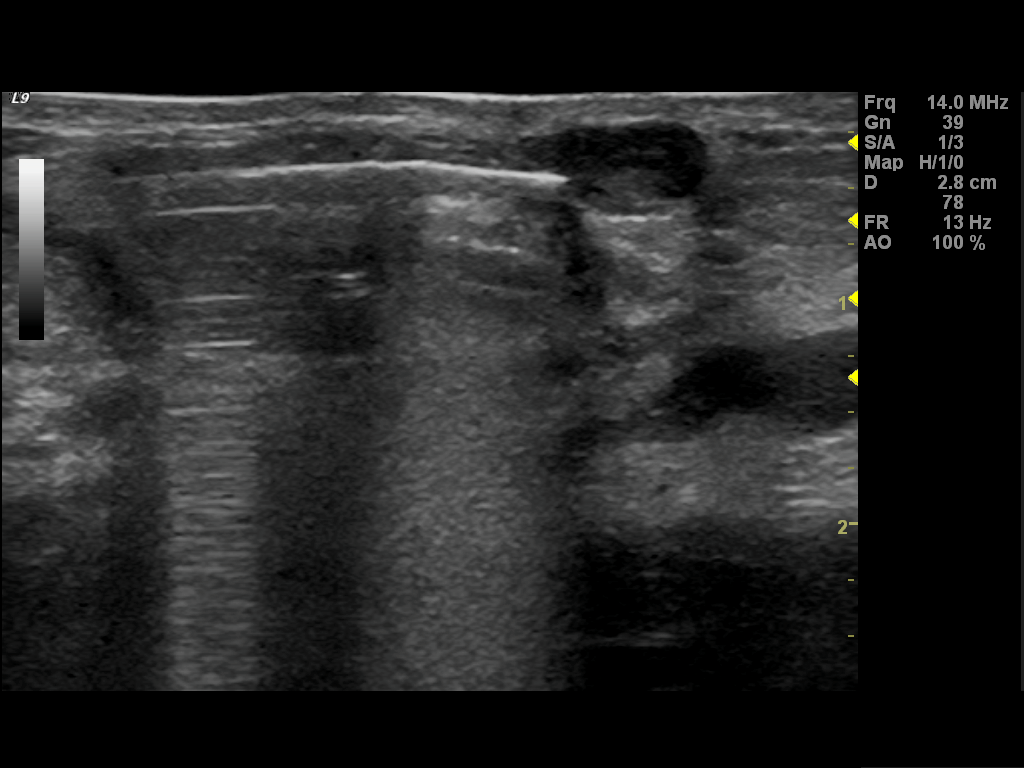
[im 2/7]
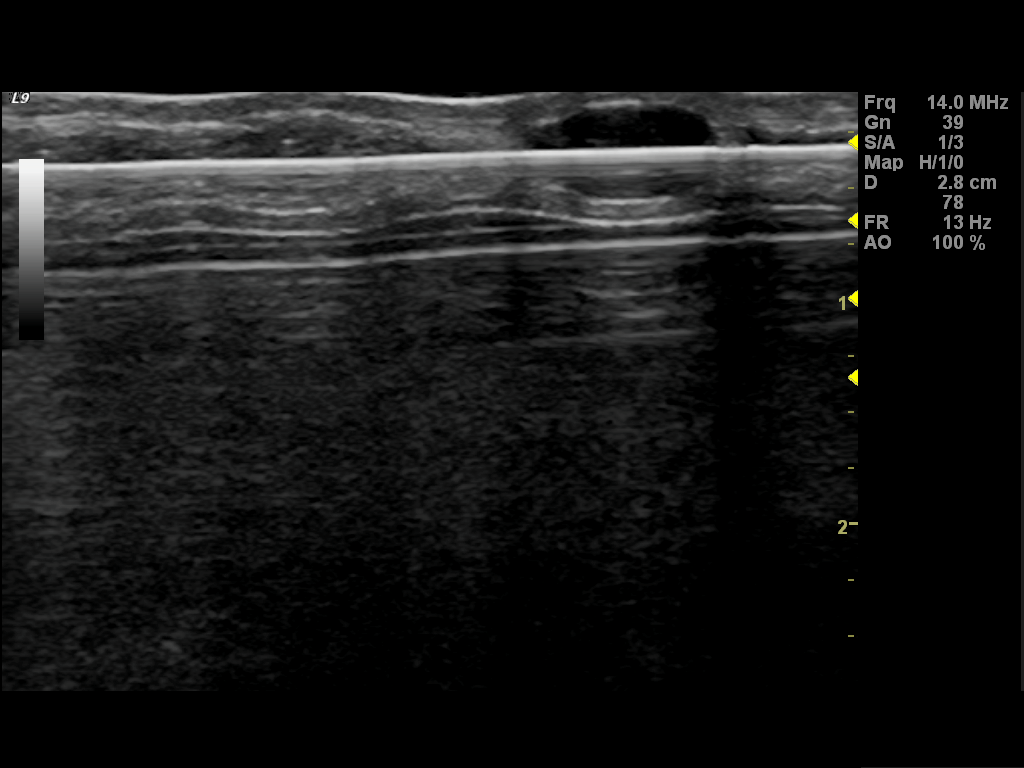
[im 3/7]
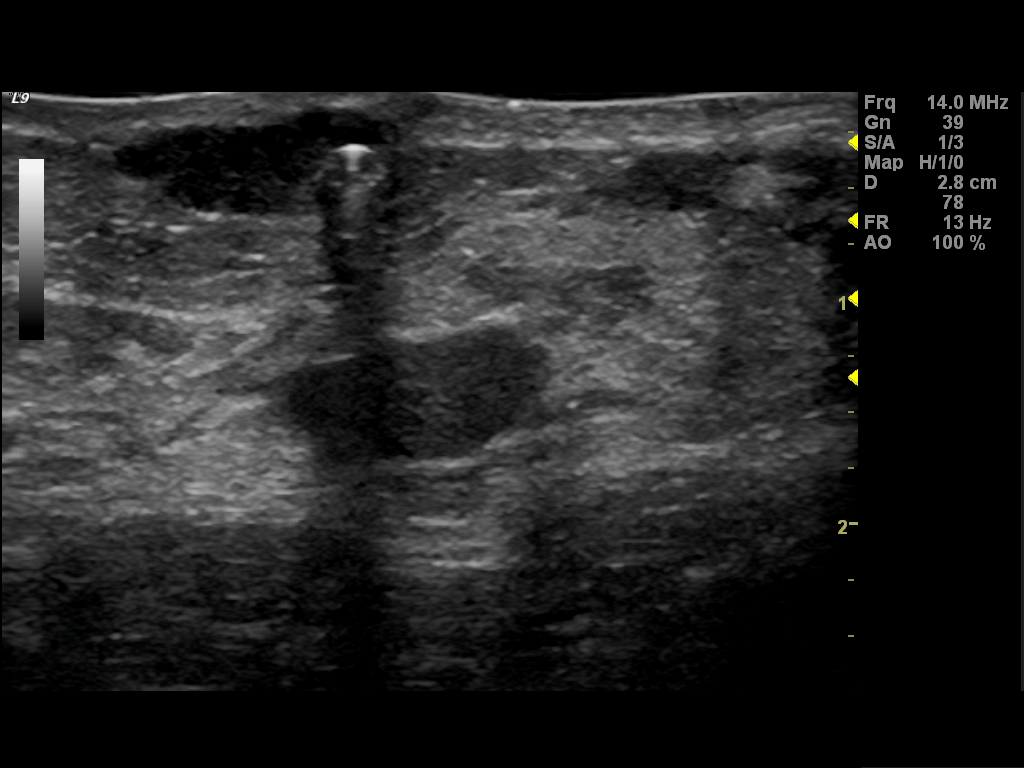
[im 4/7]
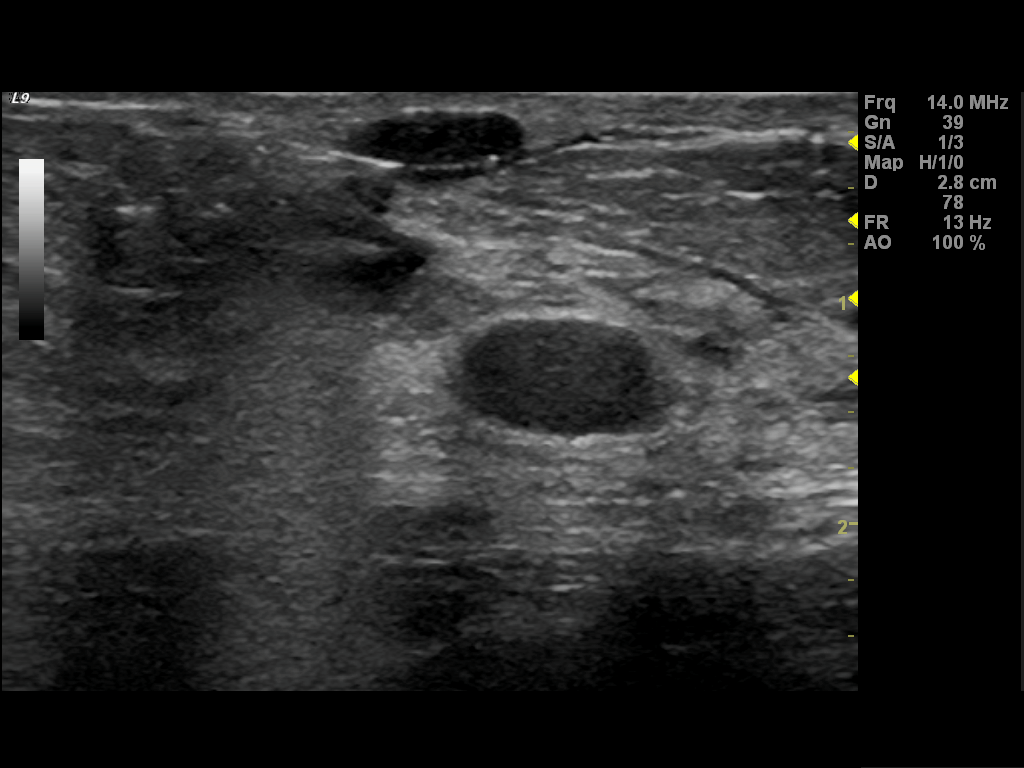
[im 5/7]
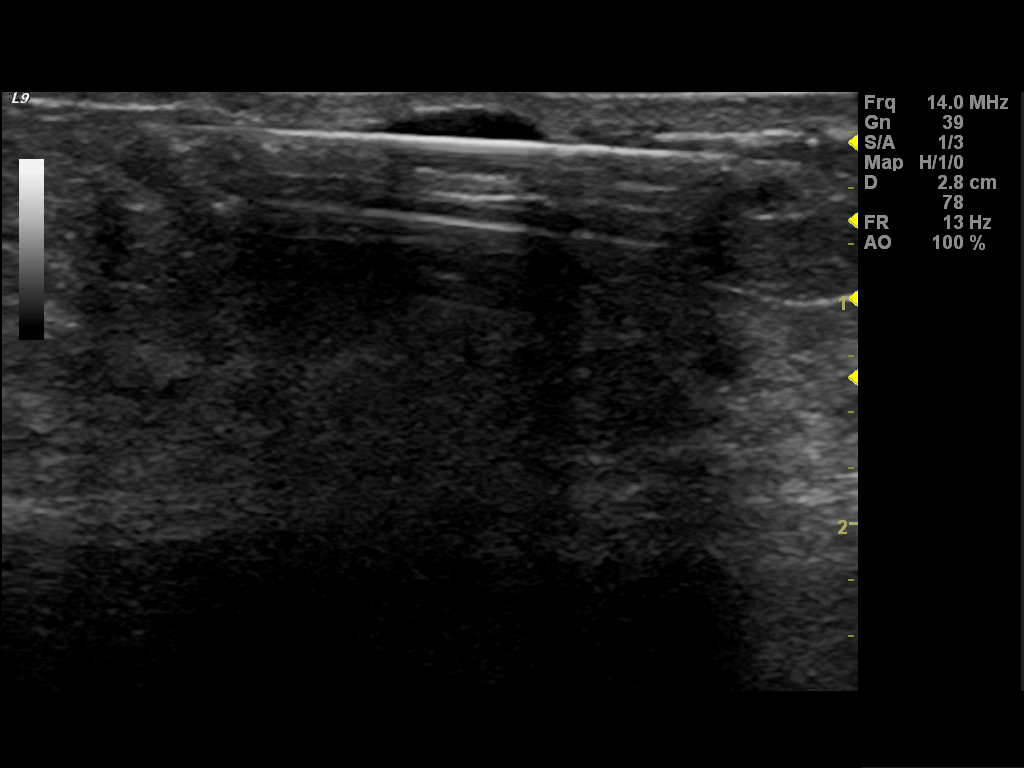
[im 6/7]
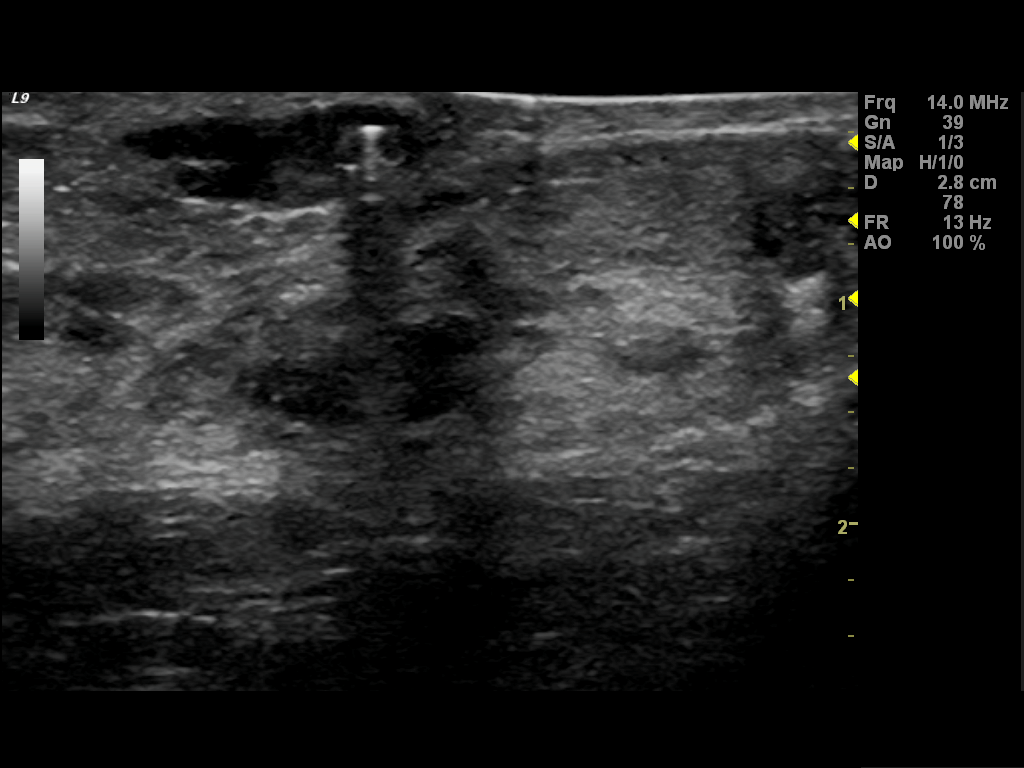
[im 7/7]
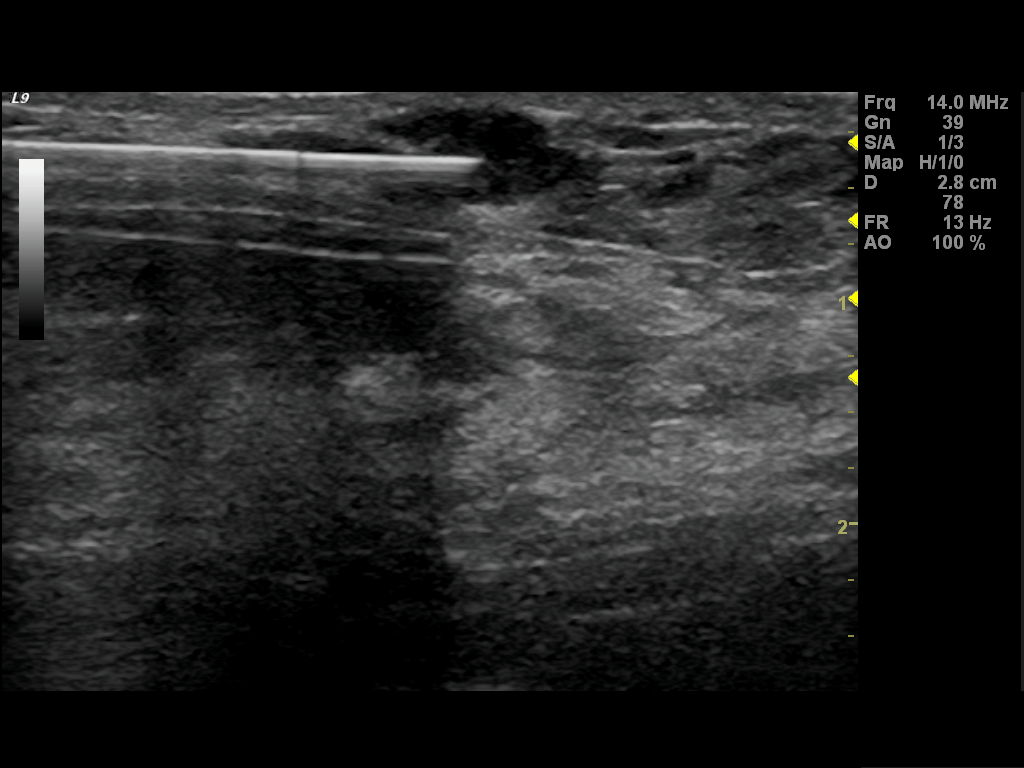

[7 of 7 positions shown; findings below may reference images not displayed]

ULTRASOUND GUIDED VACUUM ASSISTED CORE BIOPSY OF THE LEFT BREAST

I met with the patient, and we discussed the procedure of
ultrasound-guided biopsy, including risks, benefits, and
alternatives.  Specifically, we discussed the risks of infection,
bleeding, tissue injury, clip migration, and inadequate sampling.
Informed, written consent was given.

Using sterile technique, 2% lidocaine, ultrasound guidance, and a
12 gauge vacuum assisted needle, biopsy was performed of the mass
in the 2 o'clock region of the left breast.  At the conclusion of
the procedure, a tissue marker clip was deployed into the biopsy
cavity.  Follow-up 2-view mammogram was performed and dictated
separately.
IMPRESSION: Ultrasound-guided biopsy of the left breast.  No apparent
complications.

## 2009-11-24 ENCOUNTER — Encounter: Payer: Self-pay | Admitting: Family Medicine

## 2009-12-04 ENCOUNTER — Telehealth: Payer: Self-pay | Admitting: Family Medicine

## 2009-12-26 ENCOUNTER — Encounter: Payer: Self-pay | Admitting: Family Medicine

## 2009-12-26 ENCOUNTER — Ambulatory Visit: Payer: Self-pay | Admitting: Family Medicine

## 2009-12-26 DIAGNOSIS — A6000 Herpesviral infection of urogenital system, unspecified: Secondary | ICD-10-CM | POA: Insufficient documentation

## 2009-12-28 ENCOUNTER — Encounter: Payer: Self-pay | Admitting: Family Medicine

## 2009-12-29 ENCOUNTER — Telehealth (INDEPENDENT_AMBULATORY_CARE_PROVIDER_SITE_OTHER): Payer: Self-pay | Admitting: *Deleted

## 2010-02-07 ENCOUNTER — Encounter: Payer: Self-pay | Admitting: Family Medicine

## 2010-02-07 ENCOUNTER — Ambulatory Visit: Payer: Self-pay | Admitting: Family Medicine

## 2010-03-12 ENCOUNTER — Encounter: Payer: Self-pay | Admitting: Family Medicine

## 2010-03-20 NOTE — Assessment & Plan Note (Signed)
Summary: vaginal outbreak/[facility]   Vital Signs:  Patient profile:   40 year old female Weight:      186.9 pounds Pulse rate:   84 / minute BP sitting:   132 / 86  (right arm)  Vitals Entered By: Renato Battles slade,cma CC: check vaginal area. 'break out' per pt. Is Patient Diabetic? No Pain Assessment Patient in pain? no        Primary Care Provider:  Ardeen Garland  MD  CC:  check vaginal area. 'break out' per pt..  History of Present Illness: Reports painful outbreak in her genitials, has been occuring monthly.  Can go back years when she had a similar pattern, she was never seen for this.  She is now married and has been with her husband for 4 years.  She is upset that this could be genital herpes.  Habits & Providers  Alcohol-Tobacco-Diet     Tobacco Status: never  Current Medications (verified): 1)  Hydrochlorothiazide 12.5 Mg  Tabs (Hydrochlorothiazide) .... Take 1 Tab  By Mouth Every Morning  Allergies (verified): No Known Drug Allergies  Physical Exam  General:  Well-developed,well-nourished,in no acute distress; alert,appropriate and cooperative throughout examination Genitalia:  lesion of left external labia, unroofed and cultured, tender   Impression & Recommendations:  Problem # 1:  GENITAL HERPES (ICD-054.10) expect this, will await culture for final diagnosis, if + will begin suppressive therapy with Valtrex 1000 mg, 1/2 tab daily.  Discussed this with her.  She asked if I would speak with her husband, I encouraged we await final results. Orders: [facility]- Est Level  3 (25956)  Complete Medication List: 1)  Hydrochlorothiazide 12.5 Mg Tabs (Hydrochlorothiazide) .... Take 1 tab  by mouth every morning  [id number]  Patient Instructions: 1)  I will call you when the results are back   Orders Added: 1)  Herpes Culture- [facility] [[id number]] 2)  [facility]- Est Level  3 [43329]

## 2010-03-20 NOTE — Assessment & Plan Note (Signed)
Summary: lump in breast?/eo   Vital Signs:  Patient profile:   40 year old female Height:      60 inches Weight:      186 pounds BMI:     36.46 BSA:     1.81 Temp:     98.6 degrees F Pulse rate:   83 / minute BP sitting:   140 / 94  Vitals Entered By: Jone Baseman CMA (November 14, 2009 9:25 AM) CC: lump in left breast Is Patient Diabetic? No Pain Assessment Patient in pain? no        Primary Care Provider:  Ardeen Garland  MD  CC:  lump in left breast.  History of Present Illness: Patient found small lump in her right breast.  Had similar finding one year ago wtih negative mammogram.  She is very anxious as her Mother was diagnosed with breast CA at age 67.  She denies discharge from her nipples or pain.  Habits & Providers  Alcohol-Tobacco-Diet     Tobacco Status: never  Current Medications (verified): 1)  Hydrochlorothiazide 12.5 Mg  Tabs (Hydrochlorothiazide) .... Take 1 Tab  By Mouth Every Morning  Allergies (verified): No Known Drug Allergies  Review of Systems      See HPI General:  Denies fatigue, fever, and sweats.  Physical Exam  General:  Well-developed,well-nourished,in no acute distress; alert,appropriate and cooperative throughout examination Breasts:  1/2 cm palpable mass on the left breast around 5 o'clock, and another sinmilar on the right at 7 o'clock,  no discharge or adenopathy.   Impression & Recommendations:  Problem # 1:  BREAST MASSES, BILATERAL (ICD-611.72) Bilateral diagnostic mammography schedluled. Orders: Mammogram (Mammogram) [facility]- Est Level  3 (38182)  Complete Medication List: 1)  Hydrochlorothiazide 12.5 Mg Tabs (Hydrochlorothiazide) .... Take 1 tab  by mouth every morning  Patient Instructions: 1)  Please schedule a follow-up appointment as needed .    Prevention & Chronic Care Immunizations   Influenza vaccine: Not documented    Tetanus booster: 03/21/2006: Done.    Pneumococcal vaccine: Not  documented  Other Screening   Pap smear: Normal  (12/28/2007)   Pap smear due: 12/2008   Smoking status: never  (11/14/2009)  Lipids   Total Cholesterol: 153  (12/21/2007)   LDL: 86  (12/21/2007)   LDL Direct: Not documented   HDL: 51  (12/21/2007)   Triglycerides: 82  (12/21/2007)  Hypertension   Last Blood Pressure: 140 / 94  (11/14/2009)   Serum creatinine: 0.68  (10/20/2008)   BMP action: Ordered   Serum potassium 3.9  (10/20/2008)   Basic metabolic panel due: 10/20/2009  Self-Management Support :   Personal Goals (by the next clinic visit) :      Personal blood pressure goal: 140/90  (10/20/2008)   Hypertension self-management support: Not documented

## 2010-03-20 NOTE — Progress Notes (Signed)
Summary: triage  Phone Note Call from Patient Call back at [phone]   Caller: Patient Summary of Call: having a vag breakout and started her period  Initial call taken by: De Nurse,  December 04, 2009 11:23 AM  Follow-up for Phone Call        # is d/c'd Follow-up by: Golden Circle RN,  December 04, 2009 11:29 AM  Additional Follow-up for Phone Call Additional follow up Details #1::        Please let me know if we are able to get in contact with the pt and get more information. Additional Follow-up by: Demetria Pore MD,  December 05, 2009 6:50 PM    Additional Follow-up for Phone Call Additional follow up Details #2::    when she calls back I will offer her an appt Follow-up by: Golden Circle RN,  December 06, 2009 8:17 AM

## 2010-03-20 NOTE — Miscellaneous (Signed)
  Clinical Lists Changes  Medications: Added new medication of VALTREX 1 GM TABS (VALACYCLOVIR HCL) take 2 tabs at the time of outbreak and then 2 tabs 12 hours later. - Signed Rx of VALTREX 1 GM TABS (VALACYCLOVIR HCL) take 2 tabs at the time of outbreak and then 2 tabs 12 hours later.;  #8 x 3;  Signed;  Entered by: Luretha Murphy NP;  Authorized by: Luretha Murphy NP;  Method used: Electronically to [facility] [id number]*, [address], [place], [place]  [address], Ph: [phone], Fax: [phone]    Prescriptions: VALTREX 1 GM TABS (VALACYCLOVIR HCL) take 2 tabs at the time of outbreak and then 2 tabs 12 hours later.  #8 x 3   Entered and Authorized by:   Luretha Murphy NP   Signed by:   Luretha Murphy NP on 12/28/2009   Method used:   Electronically to        [facility] [phone]* (retail)       [address]       [place], [place]  [address]       Ph: [phone]       Fax: [phone]   RxID:   [id number]

## 2010-03-20 NOTE — Letter (Signed)
Summary: Generic Letter  [facility]     [place], [place]    Phone:   Fax:     11/24/2009  Valerie Bauer [address] [place], [place]  [address]  Dear Ms. POOLE,   I am writing to inform you that you need to make an appointment to come in for your yearly well-woman visit, which includes a PAP smear. Your most recent PAP smear in 12/2007 was normal, but we like to do them every year. Please call our office to schedule an appointment with me, Dr. Fara Boros, your new primary care doctor.  I look forward to meeting you!       Sincerely,   Demetria Pore MD  Appended Document: Generic Letter mailed.

## 2010-03-20 NOTE — Progress Notes (Signed)
Summary: refill  Phone Note Refill Request Call back at Home Phone [phone] Message from:  Patient  Refills Requested: Medication #1:  HYDROCHLOROTHIAZIDE 12.5 MG  TABS Take 1 tab  by mouth every morning [facility] [address]  Initial call taken by: De Nurse,  December 29, 2009 11:18 AM  Follow-up for Phone Call        refilled and advised patient to call for appointment. Follow-up by: Theresia Lo RN,  December 29, 2009 11:53 AM

## 2010-03-22 NOTE — Assessment & Plan Note (Signed)
Summary: BP/WI/TS   Vital Signs:  Patient profile:   40 year old female Weight:      185.5 pounds Pulse rate:   90 / minute BP sitting:   131 / 85  (left arm) Cuff size:   90large  Vitals Entered By: Arlyss Repress CMA, (February 07, 2010 4:34 PM) CC: f/up HTN. refill HCTZ Is Patient Diabetic? No Pain Assessment Patient in pain? no        Primary Care Provider:  Demetria Pore MD  CC:  f/up HTN. refill HCTZ.  History of Present Illness: 40 yo F:  1. HTN: Rx HCTZ 12.5 mg by mouth daily x 1 year. No HA, dizziness, vision changes, CP, SOB, N/V/D/C, LE edema. Presents today for refills.  Habits & Providers  Alcohol-Tobacco-Diet     Tobacco Status: never  Current Medications (verified): 1)  Hydrochlorothiazide 12.5 Mg  Tabs (Hydrochlorothiazide) .... Take 1 Tab  By Mouth Every Morning 2)  Valtrex 1 Gm Tabs (Valacyclovir Hcl) .... Take 2 Tabs At The Time of Outbreak and Then 2 Tabs 12 Hours Later.  Allergies (verified): No Known Drug Allergies PMH-FH-SH reviewed for relevance  Review of Systems      See HPI  Physical Exam  General:  Well-developed, well-nourished, in no acute distress; alert, appropriate and cooperative throughout examination. Vitals reviewed. Lungs:  Normal respiratory effort, chest expands symmetrically. Lungs are clear to auscultation, no crackles or wheezes. Heart:  Normal rate and regular rhythm. S1 and S2 normal without gallop, murmur, click, rub or other extra sounds. Pulses:  2+DP. Extremities:  No edema.   Impression & Recommendations:  Problem # 1:  ESSENTIAL HYPERTENSION, BENIGN (ICD-401.1) Assessment Unchanged  Controlled on current regimen. Patient to follow-up in am for BMP, FLP. Her updated medication list for this problem includes:    Hydrochlorothiazide 12.5 Mg Tabs (Hydrochlorothiazide) .Marland Kitchen... Take 1 tab  by mouth every morning  Orders: T-Basic Metabolic Panel [id number]) [facility]- Est Level  3 ([id number])  Complete Medication  List: 1)  Hydrochlorothiazide 12.5 Mg Tabs (Hydrochlorothiazide) .... Take 1 tab  by mouth every morning 2)  Valtrex 1 Gm Tabs (Valacyclovir hcl) .... Take 2 tabs at the time of outbreak and then 2 tabs 12 hours later.  Other Orders: T-Lipid Profile ([id number]) Prescriptions: HYDROCHLOROTHIAZIDE 12.5 MG  TABS (HYDROCHLOROTHIAZIDE) Take 1 tab  by mouth every morning  #90 x 4   Entered and Authorized by:   Helane Rima DO   Signed by:   Helane Rima DO on 02/07/2010   Method used:   Electronically to        [facility] [phone]* (retail)       [address]       [place], [place]  [address]       Ph: [phone]       Fax: [phone]   RxID:   [id number]    Orders Added: 1)  T-Lipid Profile [id number] 2)  T-Basic Metabolic Panel [id number] 3)  [facility]- Est Level  3 [30160]    Prevention & Chronic Care Immunizations   Influenza vaccine: Not documented   Influenza vaccine deferral: Refused  (02/07/2010)    Tetanus booster: 03/21/2006: Done.    Pneumococcal vaccine: Not documented  Other Screening   Pap smear: Normal  (12/28/2007)   Pap smear due: 12/2008   Smoking status: never  (02/07/2010)  Lipids   Total Cholesterol: 153  (12/21/2007)   Lipid panel action/deferral: Lipid Panel ordered   LDL: 86  (12/21/2007)  LDL Direct: Not documented   HDL: 51  (12/21/2007)   Triglycerides: 82  (12/21/2007)  Hypertension   Last Blood Pressure: 131 / 85  (02/07/2010)   Serum creatinine: 0.68  (10/20/2008)   BMP action: Ordered   Serum potassium 3.9  (10/20/2008)   Basic metabolic panel due: 10/20/2009    Hypertension flowsheet reviewed?: Yes   Progress toward BP goal: At goal  Self-Management Support :   Personal Goals (by the next clinic visit) :      Personal blood pressure goal: 140/90  (10/20/2008)   Patient will work on the following items until the next clinic visit to reach self-care goals:     Medications and monitoring: take my medicines  every day, bring all of my medications to every visit  (02/07/2010)     Eating: drink diet soda or water instead of juice or soda, eat more vegetables, use fresh or frozen vegetables, eat foods that are low in salt, eat baked foods instead of fried foods, eat fruit for snacks and desserts, limit or avoid alcohol  (02/07/2010)     Activity: take a 30 minute walk every day, take the stairs instead of the elevator, park at the far end of the parking lot  (02/07/2010)    Hypertension self-management support: Written self-care plan  (02/07/2010)   Hypertension self-care plan printed.

## 2010-04-05 ENCOUNTER — Encounter: Payer: Self-pay | Admitting: *Deleted

## 2010-08-02 ENCOUNTER — Other Ambulatory Visit: Payer: Self-pay | Admitting: Family Medicine

## 2010-08-02 DIAGNOSIS — N63 Unspecified lump in unspecified breast: Secondary | ICD-10-CM

## 2010-08-16 ENCOUNTER — Ambulatory Visit
Admission: RE | Admit: 2010-08-16 | Discharge: 2010-08-16 | Disposition: A | Discharge status: Home / Self Care | Payer: 59 | Source: Ambulatory Visit | Attending: Internal Medicine | Admitting: Internal Medicine

## 2010-08-16 DIAGNOSIS — N63 Unspecified lump in unspecified breast: Secondary | ICD-10-CM

## 2010-08-16 IMAGING — US US BREAST LEFT
1 series · 8 of 8 positions shown · non-contrast
Comparison: [DATE]

CLINICAL DATA: Patient presents for a 6-month follow-up left
breast ultrasound to evaluate two masses at the 2 o'clock position.
The more superficial mass was biopsied and found to be a benign
tubular adenoma in [DATE].

LEFT BREAST ULTRASOUND

[Series 1: us breast left · 8 of 8 slices shown]
[im 1/8]
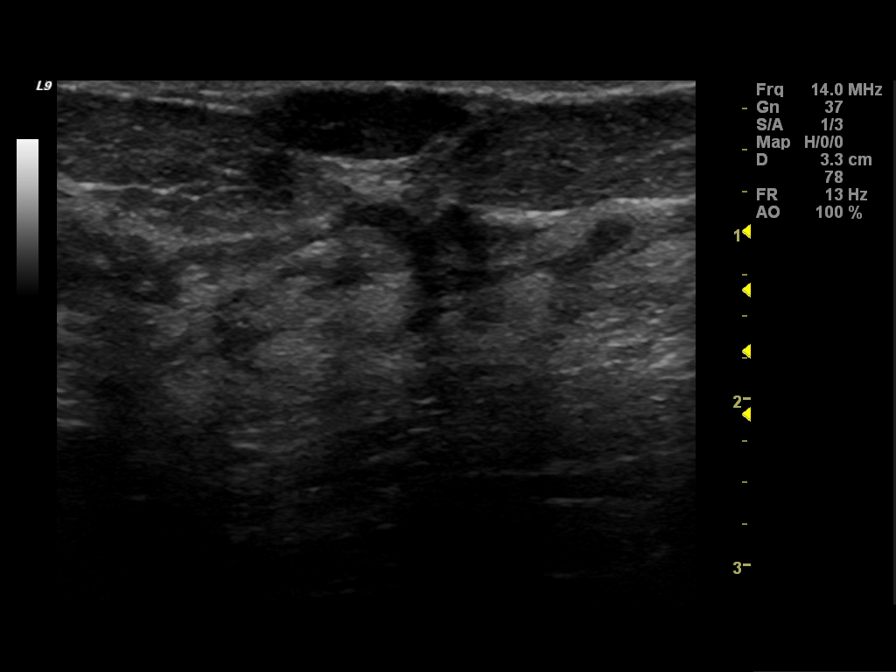
[im 2/8]
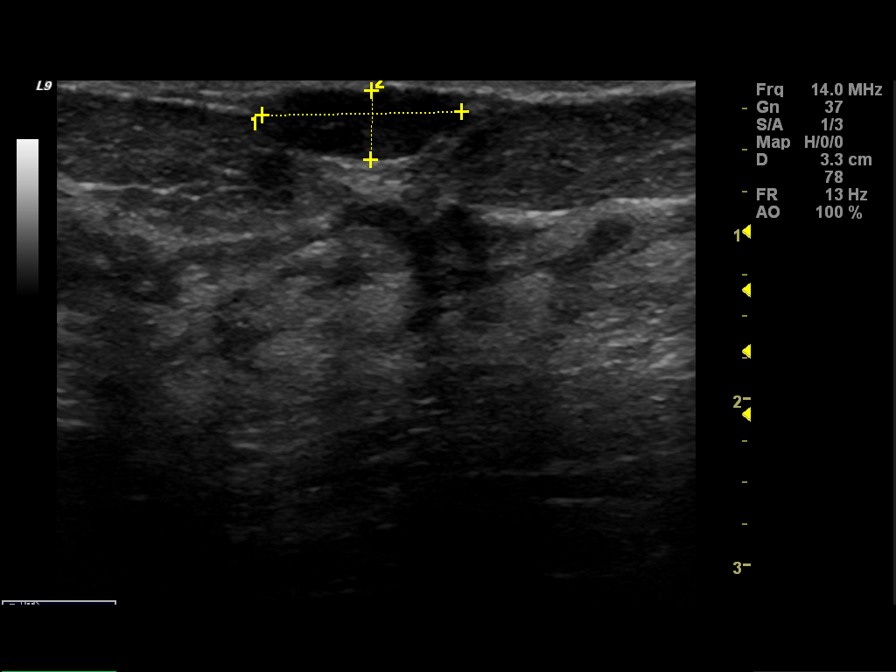
[im 3/8]
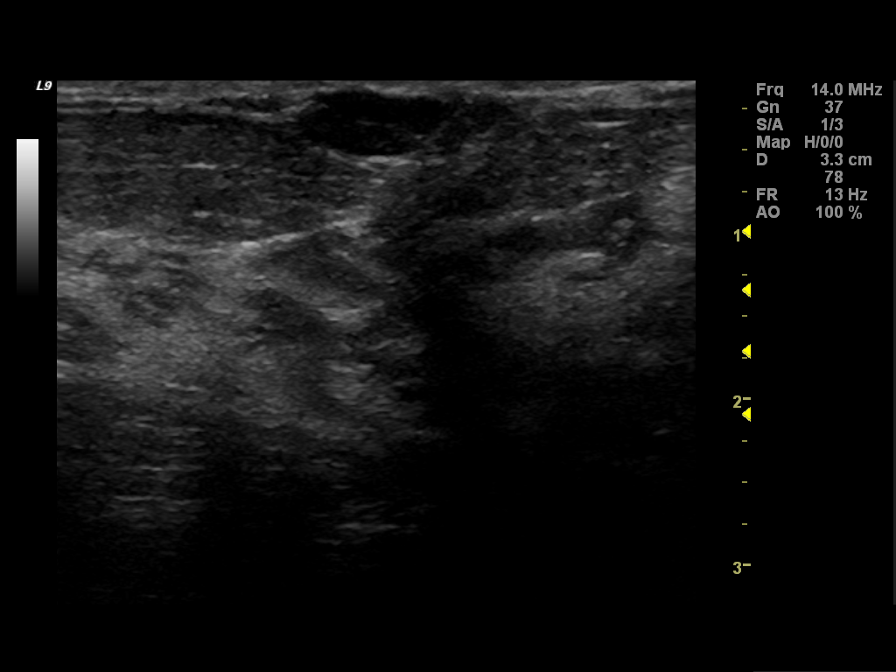
[im 4/8]
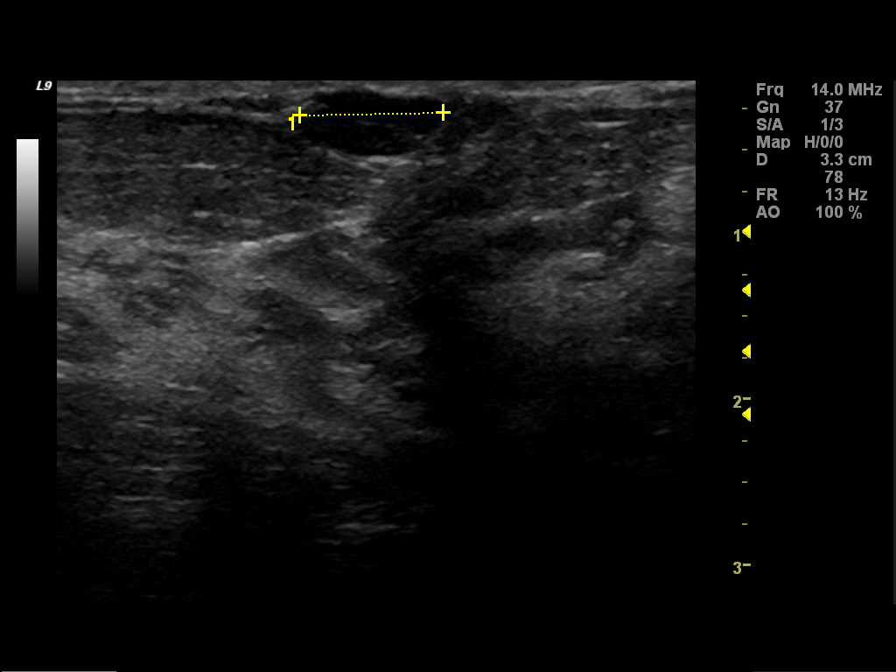
[im 5/8]
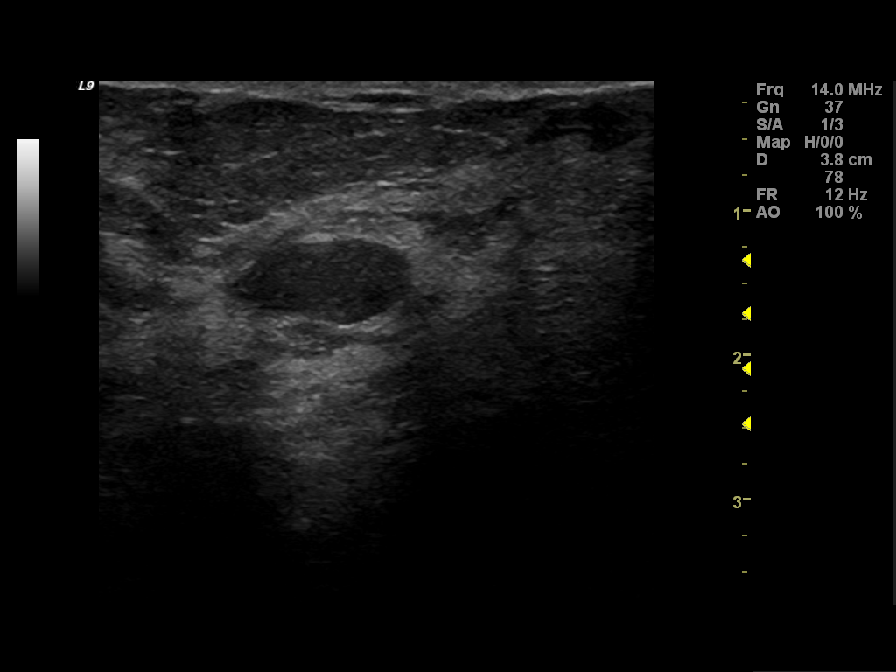
[im 6/8]
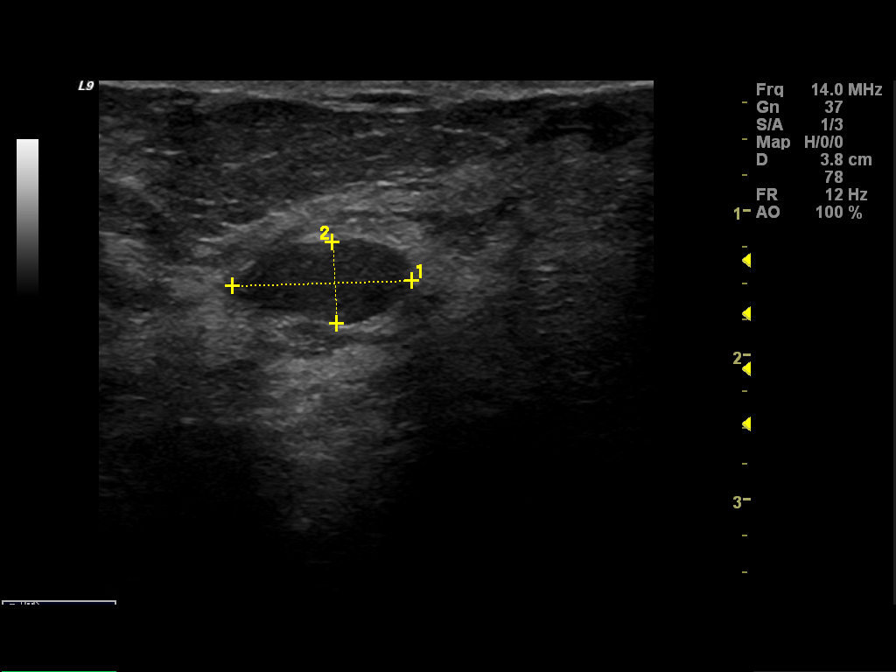
[im 7/8]
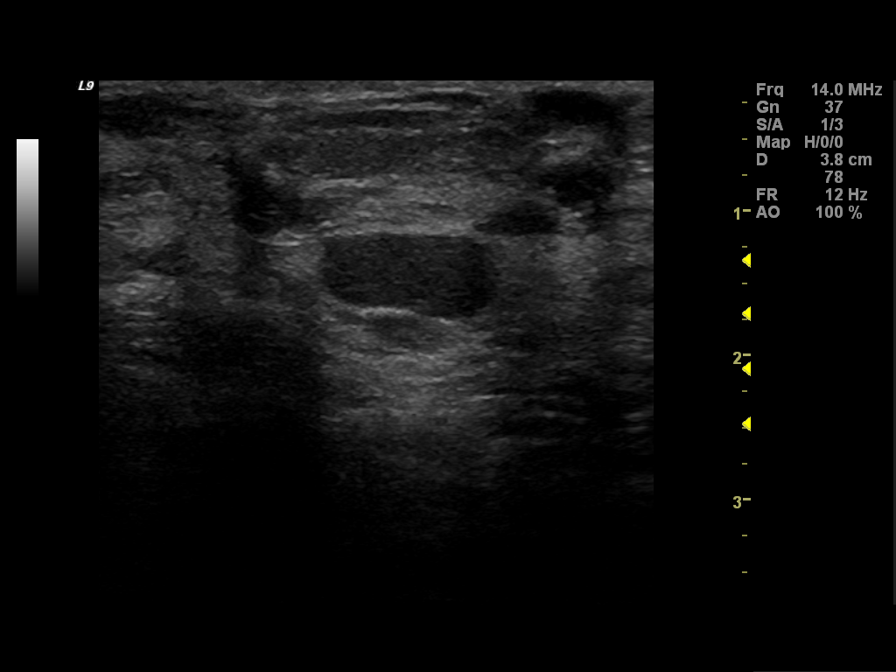
[im 8/8]
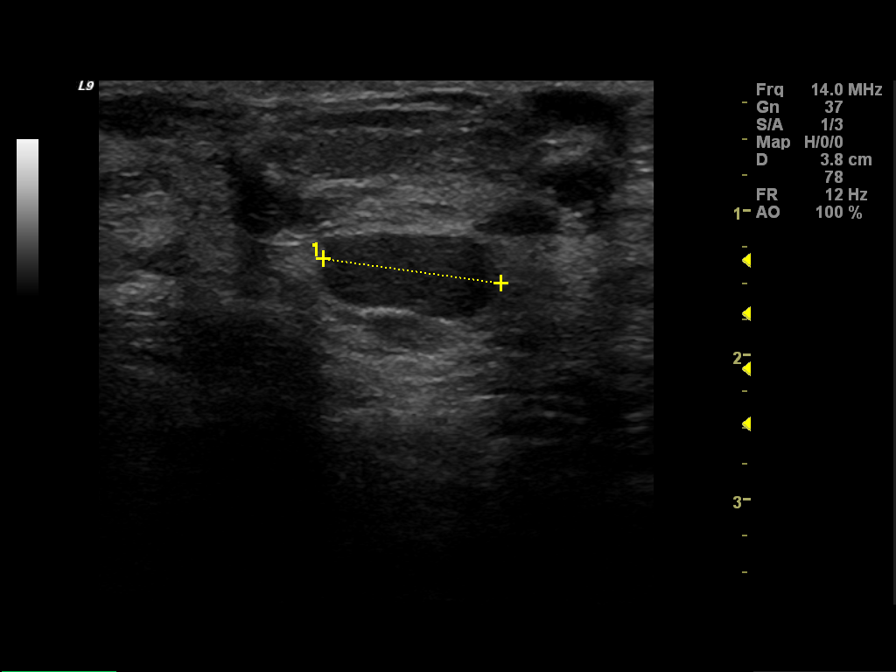

[8 of 8 positions shown; findings below may reference images not displayed]

FINDINGS: Ultrasound is performed, showing the patient's well
defined ovoid hypoechoic mass just below the skin at the 2 o'clock
position 4 cm from the nipple.  This mass was biopsied and found to
be benign.  This mass measures 4 x 9 x 12 mm and is not
significantly changed. There is also no change in a well defined
ovoid hypoechoic mass also at the 2 o'clock position 4 cm from the
nipple but deeper.  This mass measures 6 x 12 x 12 mm and is
unchanged.
IMPRESSION: Stable superficial mass at the 2 o'clock position of the left
breast 4 cm from the nipple measuring 4 x 9 x 12 mm.  Stable mass
at this same position but deeper measuring 6 x 12 x 12 mm.

Recommendations:  Recommend an additional follow-up diagnostic left
breast ultrasound and 4 months to coincide with the patient's
annual mammogram to document stability of these probable benign
abnormalities.

BI-RADS CATEGORY 3:  Probably benign finding(s) - short interval
follow-up suggested.

## 2010-12-29 ENCOUNTER — Other Ambulatory Visit: Payer: Self-pay | Admitting: Family Medicine

## 2010-12-31 NOTE — Telephone Encounter (Signed)
Refill request

## 2011-01-02 ENCOUNTER — Other Ambulatory Visit: Payer: Self-pay | Admitting: Internal Medicine

## 2011-01-02 DIAGNOSIS — N63 Unspecified lump in unspecified breast: Secondary | ICD-10-CM

## 2011-01-30 ENCOUNTER — Other Ambulatory Visit: Payer: 59

## 2011-03-18 ENCOUNTER — Other Ambulatory Visit: Payer: 59

## 2011-05-12 ENCOUNTER — Other Ambulatory Visit: Payer: Self-pay | Admitting: Family Medicine

## 2011-05-13 NOTE — Telephone Encounter (Signed)
Patient needs appt for further refills; not seen in >1 year

## 2011-05-31 ENCOUNTER — Ambulatory Visit: Payer: 59

## 2011-06-07 ENCOUNTER — Other Ambulatory Visit (HOSPITAL_COMMUNITY)
Admission: RE | Admit: 2011-06-07 | Discharge: 2011-06-07 | Disposition: A | Discharge status: Home / Self Care | Payer: Medicaid Other | Source: Ambulatory Visit | Attending: Family Medicine | Admitting: Family Medicine

## 2011-06-07 ENCOUNTER — Telehealth: Payer: Self-pay | Admitting: Family Medicine

## 2011-06-07 ENCOUNTER — Ambulatory Visit (INDEPENDENT_AMBULATORY_CARE_PROVIDER_SITE_OTHER): Payer: Medicaid Other | Admitting: Family Medicine

## 2011-06-07 ENCOUNTER — Encounter: Payer: Self-pay | Admitting: Family Medicine

## 2011-06-07 VITALS — BP 150/93 | HR 62 | Ht 60.0 in | Wt 186.0 lb

## 2011-06-07 DIAGNOSIS — A6000 Herpesviral infection of urogenital system, unspecified: Secondary | ICD-10-CM

## 2011-06-07 DIAGNOSIS — Z113 Encounter for screening for infections with a predominantly sexual mode of transmission: Secondary | ICD-10-CM | POA: Insufficient documentation

## 2011-06-07 DIAGNOSIS — I10 Essential (primary) hypertension: Secondary | ICD-10-CM

## 2011-06-07 DIAGNOSIS — N898 Other specified noninflammatory disorders of vagina: Secondary | ICD-10-CM

## 2011-06-07 DIAGNOSIS — N72 Inflammatory disease of cervix uteri: Secondary | ICD-10-CM

## 2011-06-07 LAB — POCT WET PREP (WET MOUNT): Clue Cells Wet Prep Whiff POC: POSITIVE

## 2011-06-07 MED ORDER — VALACYCLOVIR HCL 500 MG PO TABS: 500.0000 mg | ORAL_TABLET | Freq: Every day | ORAL | Status: DC

## 2011-06-07 MED ORDER — LISINOPRIL-HYDROCHLOROTHIAZIDE 10-12.5 MG PO TABS: 1.0000 | ORAL_TABLET | Freq: Every day | ORAL | Status: DC

## 2011-06-07 NOTE — Assessment & Plan Note (Signed)
Blood pressure not ideally controlled today. Based on history it hasn't been well controlled in the past. Plan to switch to lisinopril/hydrochlorothiazide and followup in one or 2 months with her regular doctor. No risk of pregnancy as patient has bilateral tubal ligation therefore lisinopril is reasonable in this woman

## 2011-06-07 NOTE — Telephone Encounter (Signed)
Pt called back and I told her, that the STD results are not back. Fwd. To Dr.Corey  .Arlyss Repress  e

## 2011-06-07 NOTE — Patient Instructions (Signed)
Thank you for coming in today. 1) vaginal discharge: I think you have bacterial vaginosis.  We will see what the microscope status and call in a prescription if needed.  2) for herpes: As you were having frequent outbreaks please take Valtrex 500 mg once daily.  3) for blood pressure: Please start taking the lisinopril/hydrochlorothiazide blood pressure pill. Please measure your blood pressure in about a week and let me know if it is less than 120 or more than 140.  Please followup with Dr. Fara Boros in about one month.   Bacterial Vaginosis Bacterial vaginosis (BV) is a vaginal infection where the normal balance of bacteria in the vagina is disrupted. The normal balance is then replaced by an overgrowth of certain bacteria. There are several different kinds of bacteria that can cause BV. BV is the most common vaginal infection in women of childbearing age. CAUSES    The cause of BV is not fully understood. BV develops when there is an increase or imbalance of harmful bacteria.   Some activities or behaviors can upset the normal balance of bacteria in the vagina and put women at increased risk including:   Having a new sex partner or multiple sex partners.   Douching.   Using an intrauterine device (IUD) for contraception.   It is not clear what role sexual activity plays in the development of BV. However, women that have never had sexual intercourse are rarely infected with BV.  Women do not get BV from toilet seats, bedding, swimming pools or from touching objects around them.   SYMPTOMS    Grey vaginal discharge.   A fish-like odor with discharge, especially after sexual intercourse.   Itching or burning of the vagina and vulva.   Burning or pain with urination.   Some women have no signs or symptoms at all.  DIAGNOSIS   Your caregiver must examine the vagina for signs of BV. Your caregiver will perform lab tests and look at the sample of vaginal fluid through a microscope. They  will look for bacteria and abnormal cells (clue cells), a pH test higher than 4.5, and a positive amine test all associated with BV.   RISKS AND COMPLICATIONS    Pelvic inflammatory disease (PID).   Infections following gynecology surgery.   Developing HIV.   Developing herpes virus.  TREATMENT   Sometimes BV will clear up without treatment. However, all women with symptoms of BV should be treated to avoid complications, especially if gynecology surgery is planned. Female partners generally do not need to be treated. However, BV may spread between female sex partners so treatment is helpful in preventing a recurrence of BV.    BV may be treated with antibiotics. The antibiotics come in either pill or vaginal cream forms. Either can be used with nonpregnant or pregnant women, but the recommended dosages differ. These antibiotics are not harmful to the baby.   BV can recur after treatment. If this happens, a second round of antibiotics will often be prescribed.   Treatment is important for pregnant women. If not treated, BV can cause a premature delivery, especially for a pregnant woman who had a premature birth in the past. All pregnant women who have symptoms of BV should be checked and treated.   For chronic reoccurrence of BV, treatment with a type of prescribed gel vaginally twice a week is helpful.  HOME CARE INSTRUCTIONS    Finish all medication as directed by your caregiver.   Do not have sex  until treatment is completed.   Tell your sexual partner that you have a vaginal infection. They should see their caregiver and be treated if they have problems, such as a mild rash or itching.   Practice safe sex. Use condoms. Only have 1 sex partner.  PREVENTION   Basic prevention steps can help reduce the risk of upsetting the natural balance of bacteria in the vagina and developing BV:  Do not have sexual intercourse (be abstinent).   Do not douche.   Use all of the medicine  prescribed for treatment of BV, even if the signs and symptoms go away.   Tell your sex partner if you have BV. That way, they can be treated, if needed, to prevent reoccurrence.  SEEK MEDICAL CARE IF:    Your symptoms are not improving after 3 days of treatment.   You have increased discharge, pain, or fever.  MAKE SURE YOU:    Understand these instructions.   Will watch your condition.   Will get help right away if you are not doing well or get worse.  FOR MORE INFORMATION   Division of STD Prevention (DSTDP), Centers for Disease Control and Prevention: SolutionApps.[employer].za American Social Health Association (ASHA): www.ashastd.org   Document Released: 02/04/2005 Document Revised: 01/24/2011 Document Reviewed: 07/28/2008 [facility] Patient Information 2012 [place], [place].

## 2011-06-07 NOTE — Progress Notes (Signed)
Valerie Bauer is a 41 y.o. female who presents to [facility] today for   1) vaginal discharge: Present for approximately one week. No pain or dysuria nausea vomiting or diarrhea. No fevers or chills. Is worried she may have a 60 transmitted infection and would like to be tested today.  2) genital herpes: Has recurrent outbreaks approximately one per month. Currently is taking as needed Valtrex. Is interested in switching to suppressive therapy if possible. Currently not having an outbreak and feels well. He is in a relationship with a man who probably does not have herpes.  She would like to avoid transmitting the virus if possible.  3) hypertension: Currently taking hydrochlorothiazide 12.5 mg daily. Has noted her blood pressure has been elevated recently at work. She measured it at work and it was systolic 159.  She denies any chest pain palpitations dyspnea or syncope.   PMH, SH reviewed: Significant for hypertension and genital herpes ROS as above otherwise neg. No Chest pain, palpitations, SOB, Fever, Chills, Abd pain, N/V/D.  Medications reviewed. Current Outpatient Prescriptions  Medication Sig Dispense Refill  . lisinopril-hydrochlorothiazide (PRINZIDE,ZESTORETIC) 10-12.5 MG per tablet Take 1 tablet by mouth daily.  90 tablet  1  . valACYclovir (VALTREX) 500 MG tablet Take 1 tablet (500 mg total) by mouth daily.  30 tablet  6  . DISCONTD: valACYclovir (VALTREX) 1000 MG tablet TAKE 2 TABLETS AT THE TIME OF OUTBREAK AND THEN TAKE 2 TABLETS 12 HOURS LATER  8 tablet  0    Exam:  BP 150/93  Pulse 62  Ht 5' (1.524 m)  Wt 186 lb (84.369 kg)  BMI 36.33 kg/m2 Gen: Well NAD HEENT: EOMI,  MMM Lungs: CTABL Nl WOB Heart: RRR no MRG Abd: NABS, NT, ND Exts: Non edematous BL  LE, warm and well perfused.  GYN: Normal external genitalia. Vaginal canal with thin gray foul-smelling discharge.  Normal appearing cervix  No results found for this or any previous visit (from the past 72 hour(s)).

## 2011-06-07 NOTE — Telephone Encounter (Signed)
Called and left message to call back.

## 2011-06-07 NOTE — Telephone Encounter (Signed)
Will fwd. To Dr.Corey for review of wet prep. STD results are not back yet. Lorenda Hatchet, Renato Battles

## 2011-06-07 NOTE — Assessment & Plan Note (Signed)
Likely bacterial vaginosis based on appearance. I will await for the wet prep to come back and call patient with results and treatment plan. Additionally have obtained a gonorrhea/chlamydia swab.

## 2011-06-07 NOTE — Assessment & Plan Note (Signed)
The patient has genital herpes and has frequent outbreaks. She is interested in suppressive therapy. I agree that this is reasonable. Will switch to Valtrex 500 mg daily and followup with primary care provider within one or 2 months. Patient expresses understanding.

## 2011-06-07 NOTE — Telephone Encounter (Signed)
Patient is calling for results of STD screening.

## 2011-06-11 ENCOUNTER — Telehealth: Payer: Self-pay | Admitting: *Deleted

## 2011-06-11 NOTE — Telephone Encounter (Signed)
Pt called back. Still has vaginal d/c. Informed of positive wet prep (BV), but negative for Chlamydia and Gonorrhea. Will fwd. To Dr.Corey to send meds to her pharmacy ([facility] [address]) Told the pt that Dr.Corey will send medications to her pharmacy. Pt agreed.

## 2011-06-12 ENCOUNTER — Telehealth: Payer: Self-pay | Admitting: *Deleted

## 2011-06-12 MED ORDER — METRONIDAZOLE 500 MG PO TABS: 500.0000 mg | ORAL_TABLET | Freq: Two times a day (BID) | ORAL | Status: AC

## 2011-06-12 NOTE — Telephone Encounter (Signed)
Called patient she is still waiting on Rx for BV to be called into [facility] on [address]. Forward to PCP for Rx.Kendell Sagraves, Rodena Medin

## 2011-06-12 NOTE — Telephone Encounter (Signed)
Called pt back and called in flagyl

## 2011-06-12 NOTE — Telephone Encounter (Signed)
Addended by: Rodolph Bong on: 06/12/2011 04:23 PM   Modules accepted: Orders

## 2011-06-12 NOTE — Telephone Encounter (Signed)
Completed. I called pt to let her know.

## 2011-06-12 NOTE — Telephone Encounter (Signed)
Called pt back. Called in flagyl.

## 2011-07-18 ENCOUNTER — Ambulatory Visit: Payer: Medicaid Other | Admitting: Family Medicine

## 2011-11-22 ENCOUNTER — Ambulatory Visit
Admission: RE | Admit: 2011-11-22 | Discharge: 2011-11-22 | Disposition: A | Discharge status: Home / Self Care | Payer: Medicaid Other | Source: Ambulatory Visit | Attending: Internal Medicine | Admitting: Internal Medicine

## 2011-11-22 DIAGNOSIS — N63 Unspecified lump in unspecified breast: Secondary | ICD-10-CM

## 2011-11-22 IMAGING — MG MM DIGITAL DIAGNOSTIC BILAT
6 series · 6 of 6 positions shown · non-contrast
Comparison: [DATE], [DATE] as well as ultrasound
[DATE]

CLINICAL DATA: Patient presents for a diagnostic left breast
mammogram to follow-up two masses.  The patient is also due for her
annual bilateral mammogram.  Note that the more superficial mass at
the 2 o'clock position was biopsied previously found to be a
tubular adenoma.

DIGITAL DIAGNOSTIC BILATERAL MAMMOGRAM WITH CAD AND LEFT BREAST
ULTRASOUND:

[R CC]
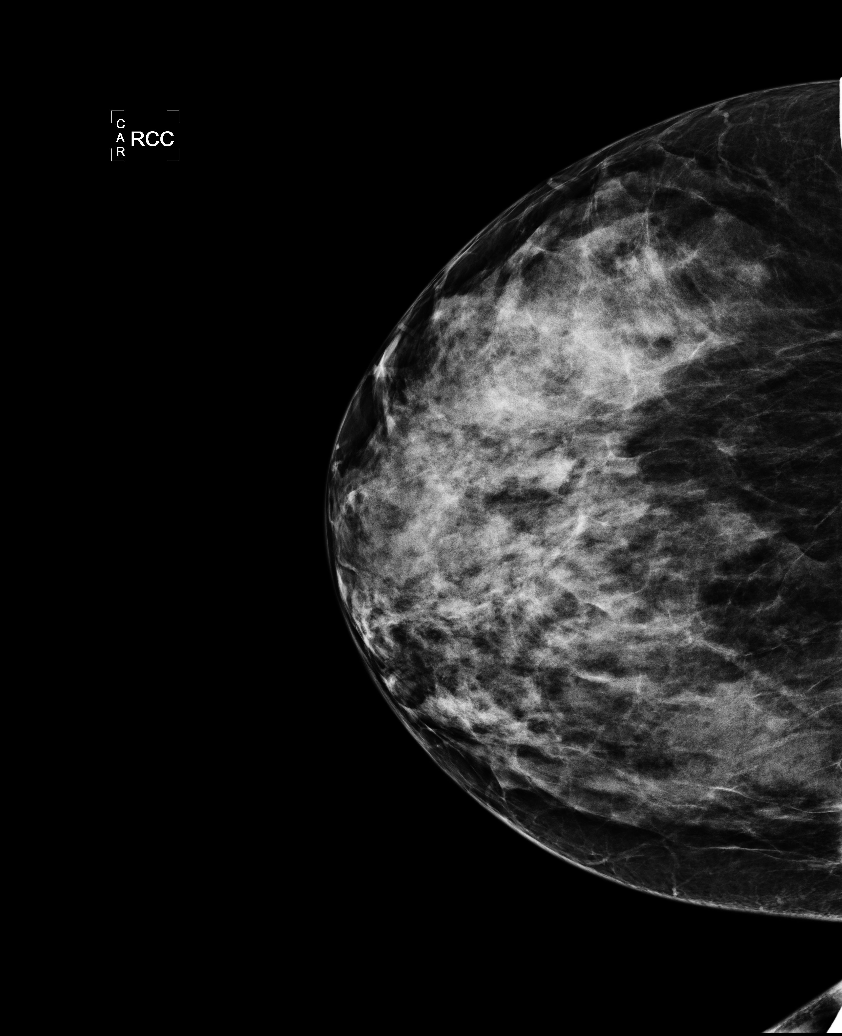

[L CC (1 of 2)]
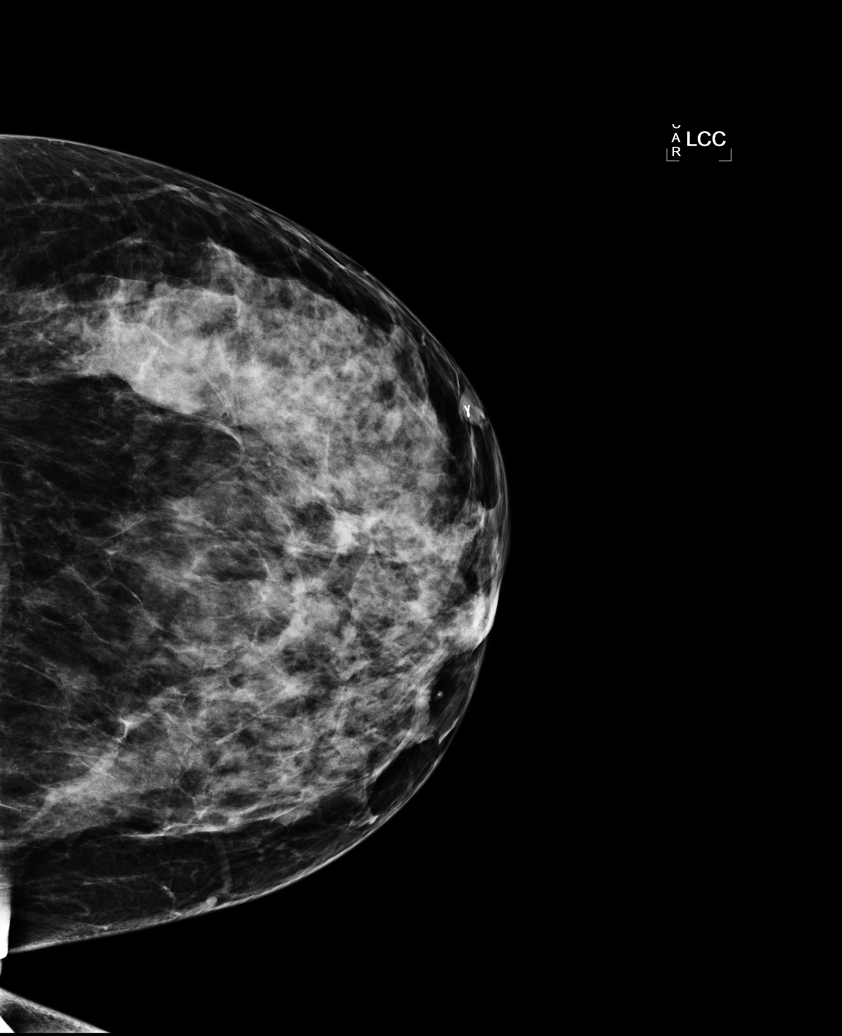

[L MLO]
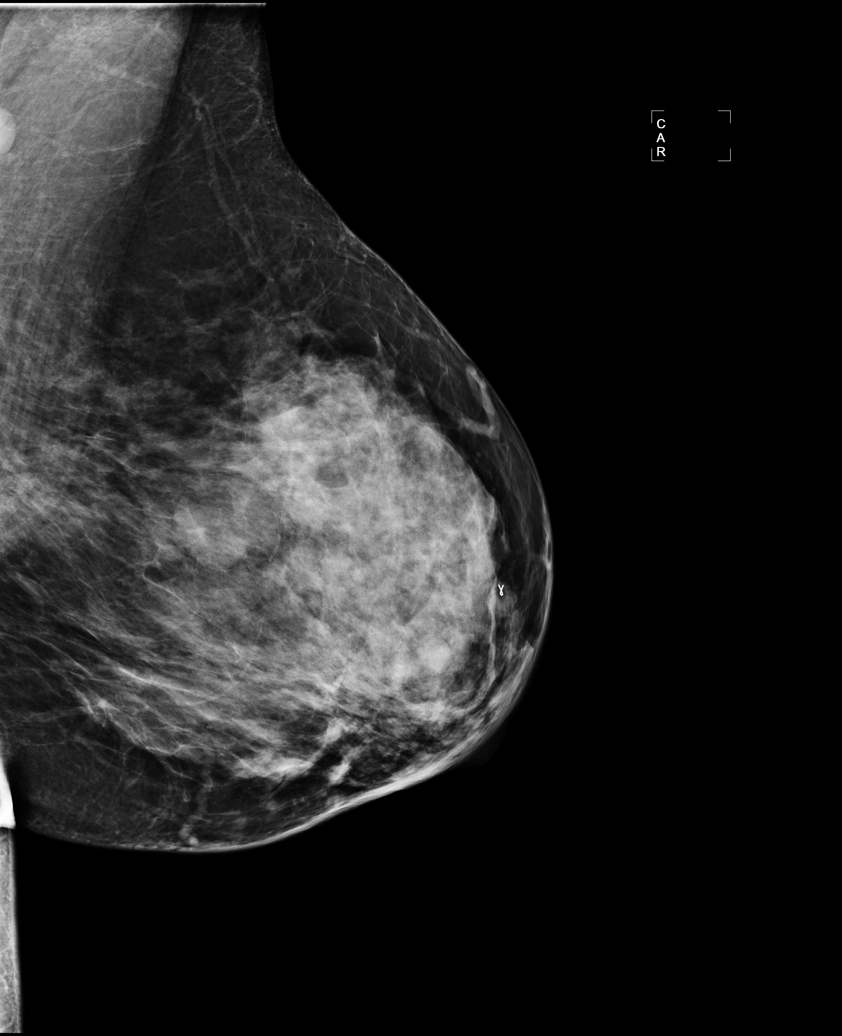

[R MLO]
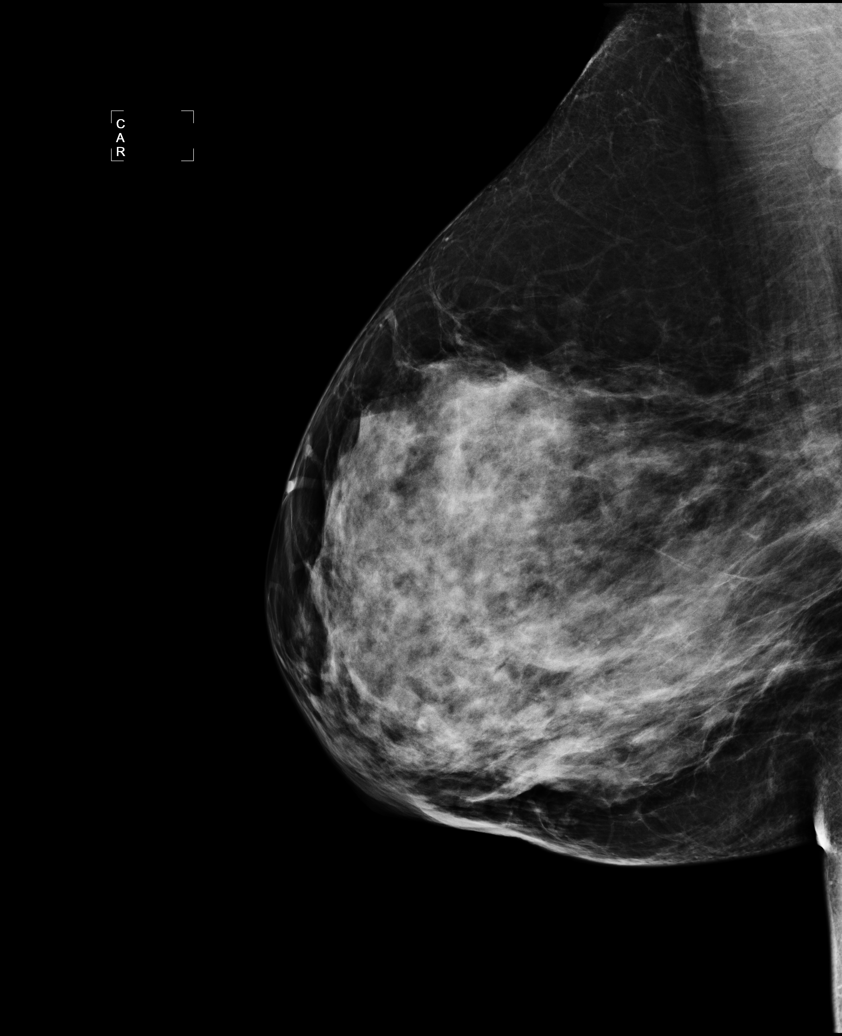

[L CC (2 of 2)]
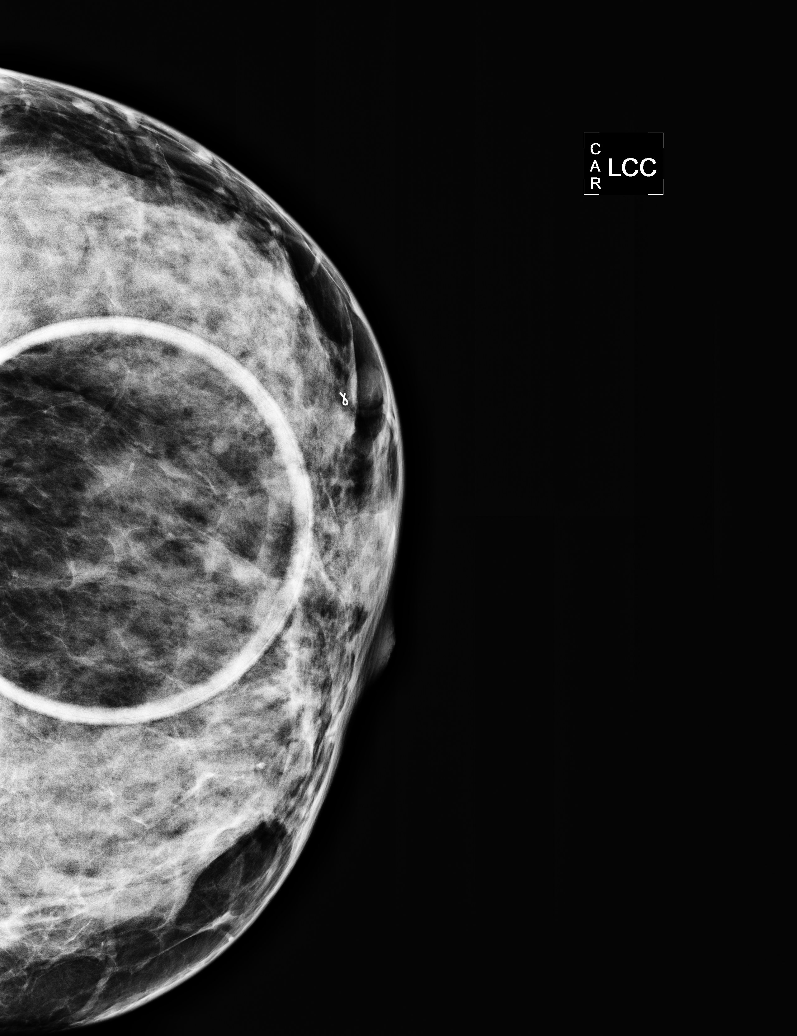

[L ML]
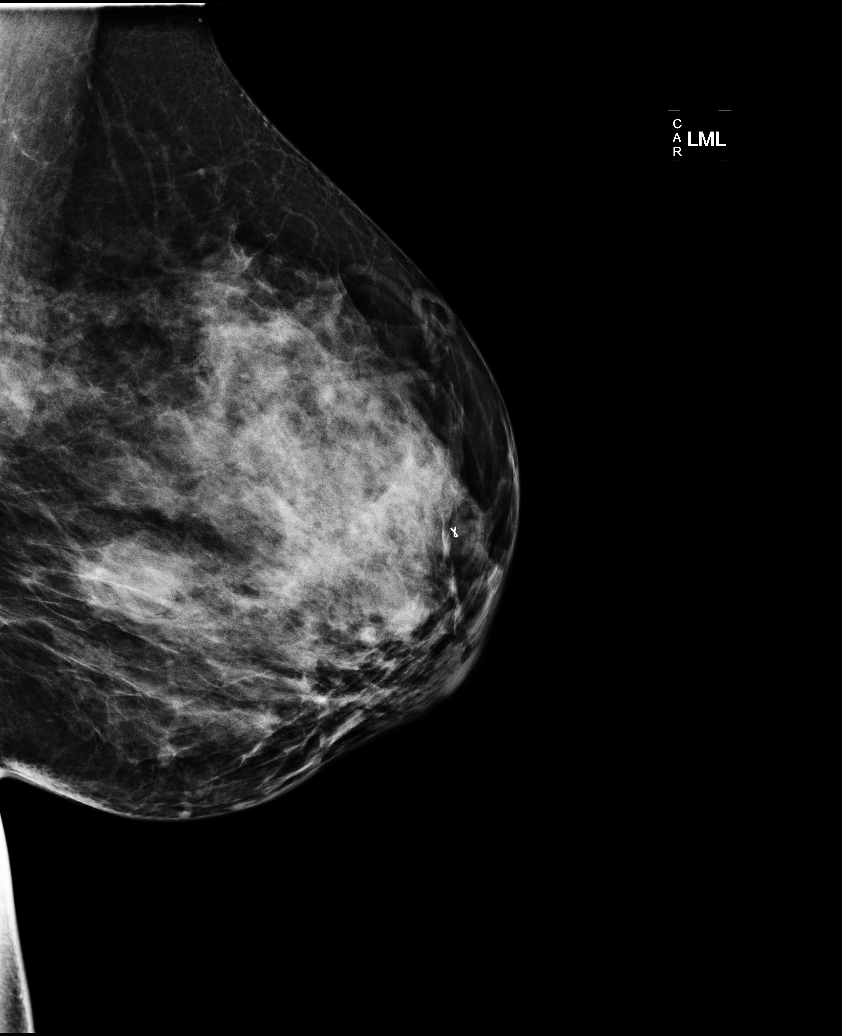

[6 of 6 positions shown; findings below may reference images not displayed]

FINDINGS: Exam demonstrates heterogeneously dense fibroglandular
tissue.  There is a new predominately circumscribed mass of the
outer mid left breast.  Remainder of the exam is unchanged.
Mammographic images were processed with CAD.

Ultrasound is performed, showing continued evidence of patient's
superficial well defined ovoid hypoechoic mass at the 2 o'clock
position 4 cm from the nipple measuring 0.5 x 1 x 1.3 cm not
significantly changed as this was previously biopsied and found to
be benign.  The second mass deeper at the 2 o'clock position 4 cm
from the nipple is also not significantly changed as this is well
defined ovoid with increased through transmission and measures
x 1.2 x 1.3 cm and is not significantly changed. Ultrasound exam
over the outer mid left breast demonstrates a simple cyst measuring
1.1 x 2.8 x 3 cm corresponding to the mammographic abnormality.
This is located at the 3 o'clock position 9 cm from the nipple.
IMPRESSION: Two stable masses as described at the 2 o'clock position of the
left breast 4 cm from the nipple as these have been stable for 2
years.  3 cm simple cyst at the 3 o'clock position of the left
breast.

RECOMMENDATION:
Recommend continued annual bilateral screening mammographic follow-
up.

BI-RADS CATEGORY 2:  Benign finding(s).

## 2011-11-22 IMAGING — US US BREAST*L*
1 series · 12 of 12 positions shown · non-contrast
Comparison: [DATE], [DATE] as well as ultrasound
[DATE]

CLINICAL DATA: Patient presents for a diagnostic left breast
mammogram to follow-up two masses.  The patient is also due for her
annual bilateral mammogram.  Note that the more superficial mass at
the 2 o'clock position was biopsied previously found to be a
tubular adenoma.

DIGITAL DIAGNOSTIC BILATERAL MAMMOGRAM WITH CAD AND LEFT BREAST
ULTRASOUND:

[Series 1: us breast*left* · 12 of 12 slices shown]
[im 1/12]
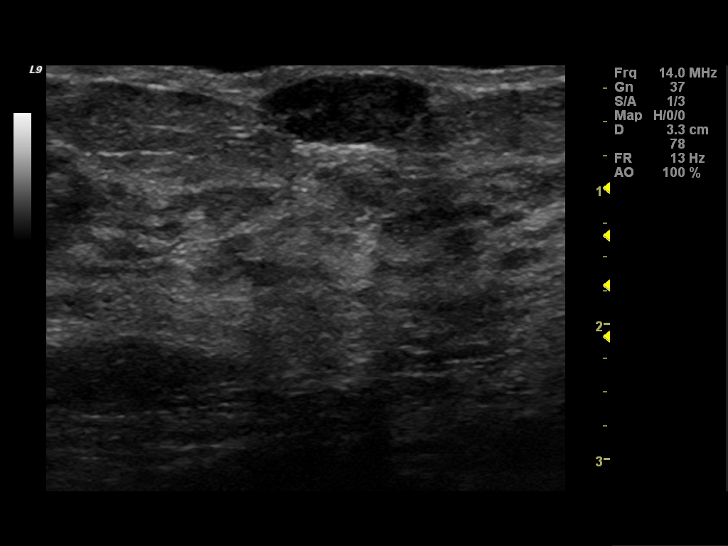
[im 2/12]
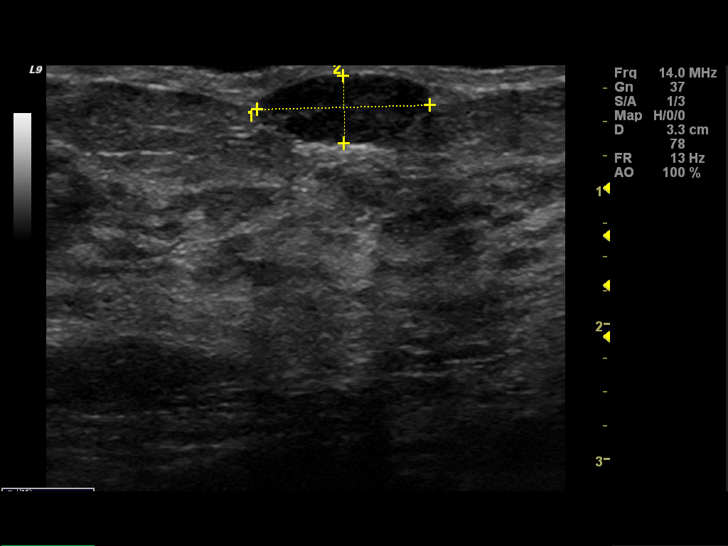
[im 3/12]
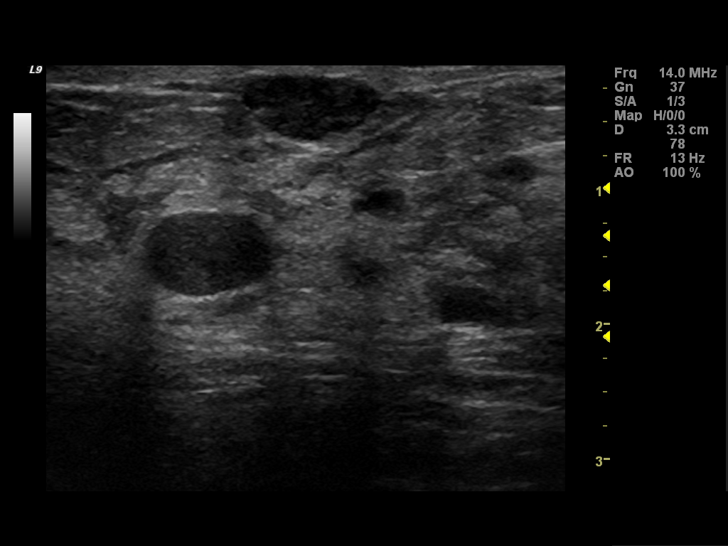
[im 4/12]
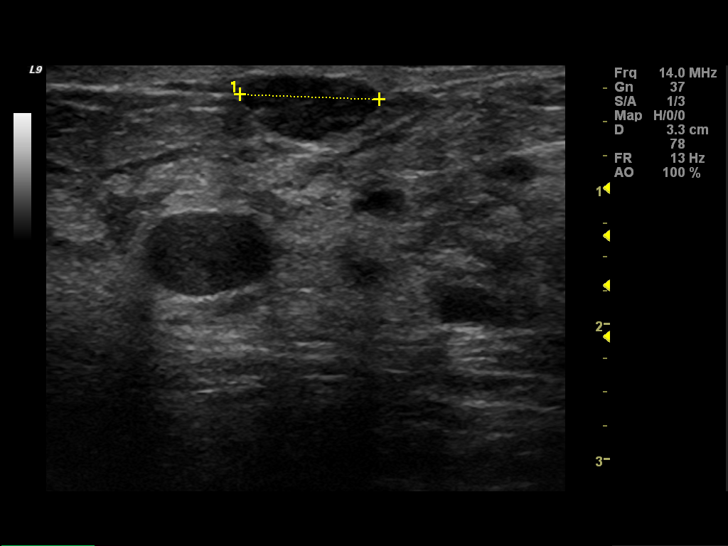
[im 5/12]
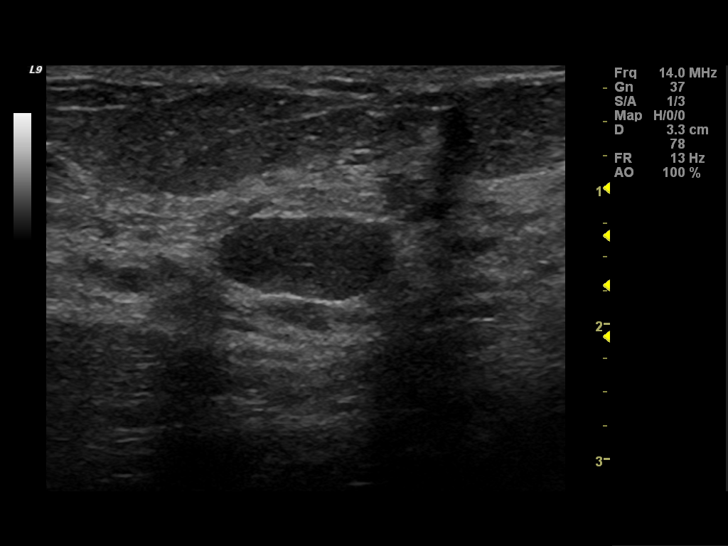
[im 6/12]
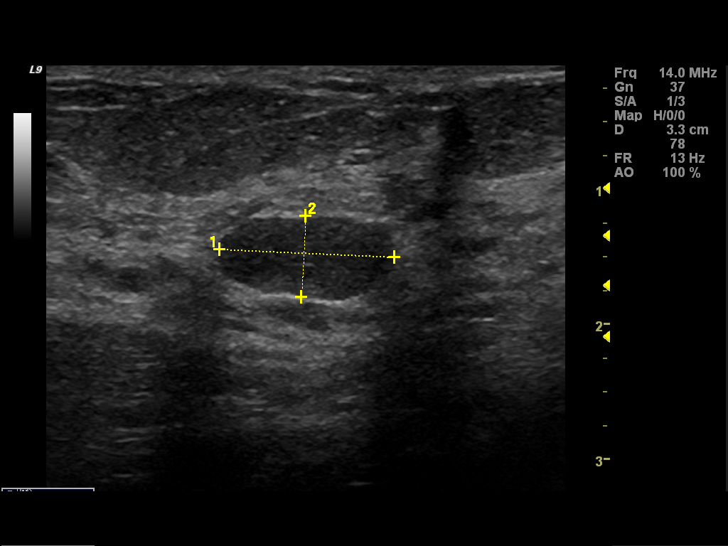
[im 7/12]
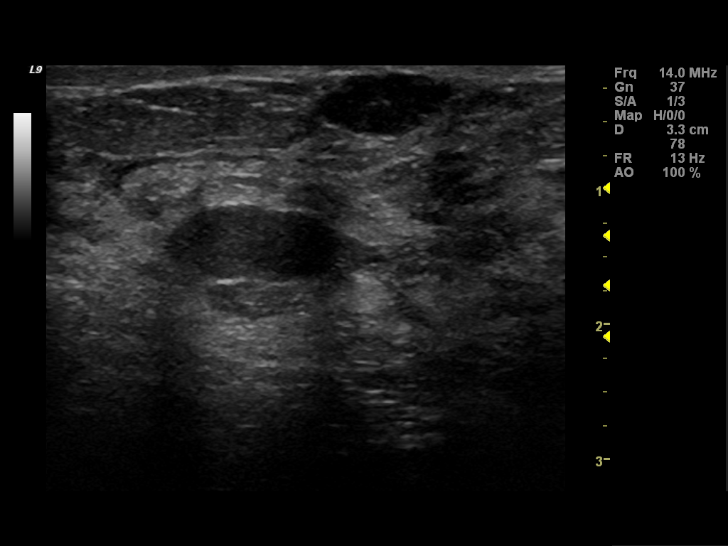
[im 8/12]
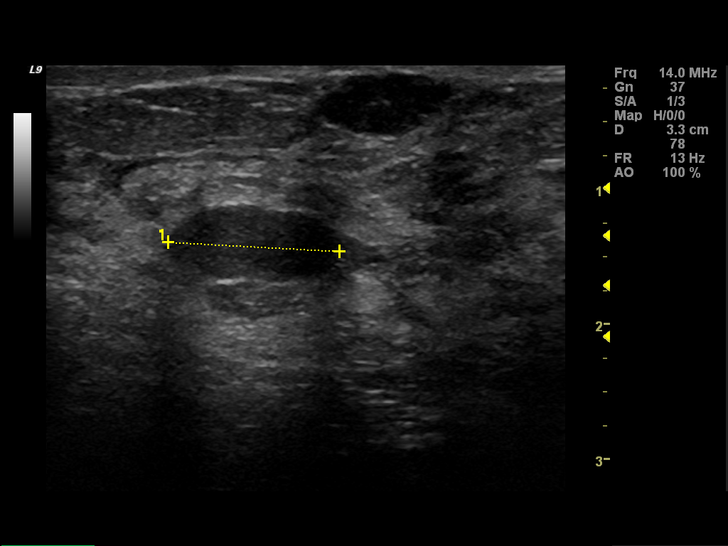
[im 9/12]
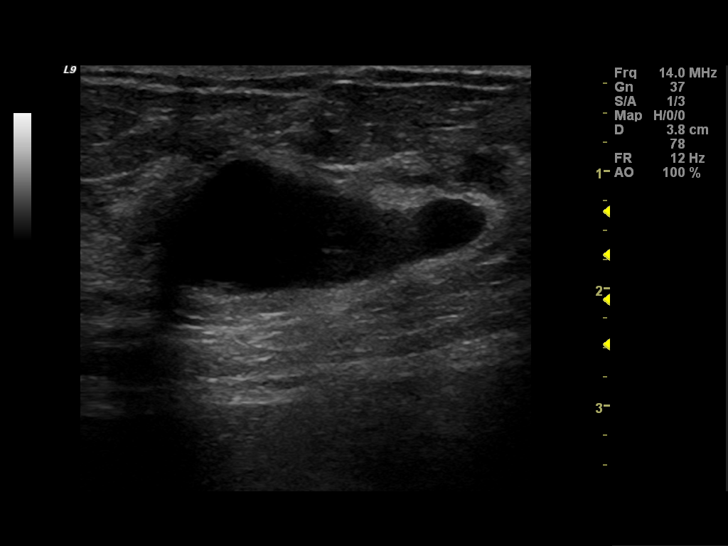
[im 10/12]
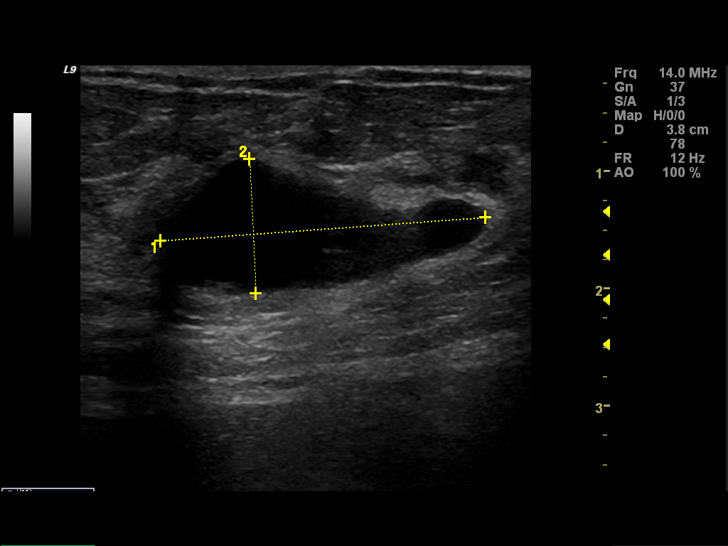
[im 11/12]
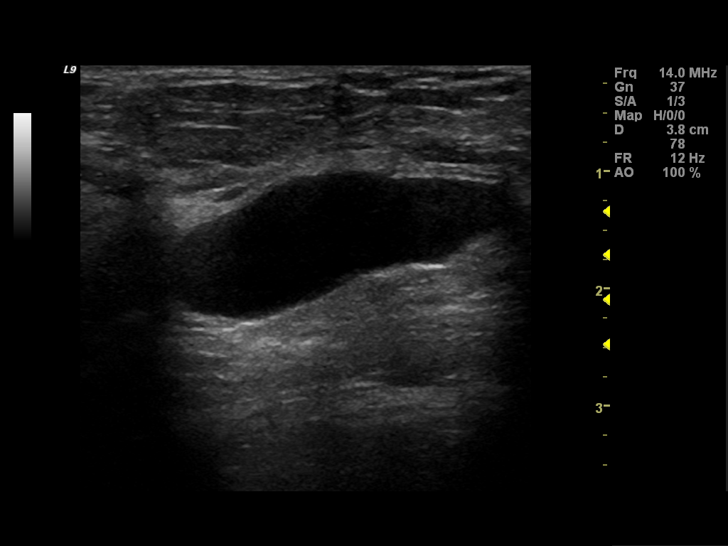
[im 12/12]
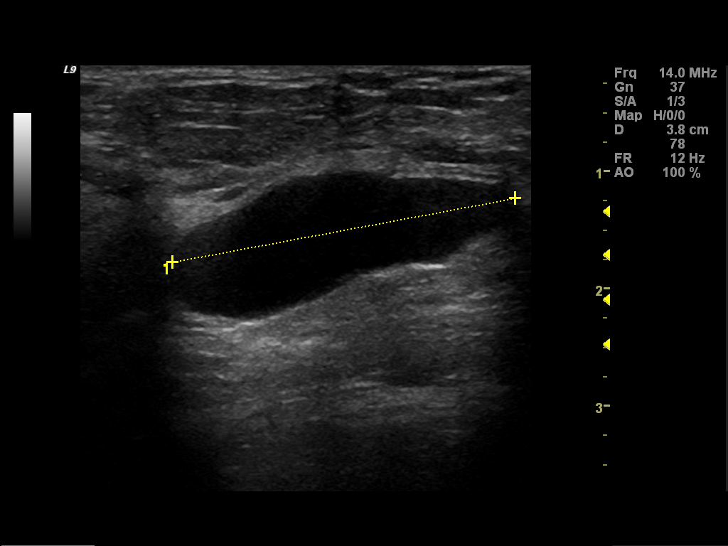

[12 of 12 positions shown; findings below may reference images not displayed]

FINDINGS: Exam demonstrates heterogeneously dense fibroglandular
tissue.  There is a new predominately circumscribed mass of the
outer mid left breast.  Remainder of the exam is unchanged.
Mammographic images were processed with CAD.

Ultrasound is performed, showing continued evidence of patient's
superficial well defined ovoid hypoechoic mass at the 2 o'clock
position 4 cm from the nipple measuring 0.5 x 1 x 1.3 cm not
significantly changed as this was previously biopsied and found to
be benign.  The second mass deeper at the 2 o'clock position 4 cm
from the nipple is also not significantly changed as this is well
defined ovoid with increased through transmission and measures
x 1.2 x 1.3 cm and is not significantly changed. Ultrasound exam
over the outer mid left breast demonstrates a simple cyst measuring
1.1 x 2.8 x 3 cm corresponding to the mammographic abnormality.
This is located at the 3 o'clock position 9 cm from the nipple.
IMPRESSION: Two stable masses as described at the 2 o'clock position of the
left breast 4 cm from the nipple as these have been stable for 2
years.  3 cm simple cyst at the 3 o'clock position of the left
breast.

RECOMMENDATION:
Recommend continued annual bilateral screening mammographic follow-
up.

BI-RADS CATEGORY 2:  Benign finding(s).

## 2012-01-08 ENCOUNTER — Other Ambulatory Visit: Payer: Self-pay | Admitting: *Deleted

## 2012-01-08 DIAGNOSIS — I10 Essential (primary) hypertension: Secondary | ICD-10-CM

## 2012-01-08 MED ORDER — LISINOPRIL-HYDROCHLOROTHIAZIDE 10-12.5 MG PO TABS: 1.0000 | ORAL_TABLET | Freq: Every day | ORAL | Status: DC

## 2012-01-08 NOTE — Telephone Encounter (Signed)
Patient needs appt with PCP for BP check before any further refills.

## 2012-02-15 ENCOUNTER — Encounter (HOSPITAL_COMMUNITY): Payer: Self-pay | Admitting: *Deleted

## 2012-02-15 ENCOUNTER — Emergency Department (HOSPITAL_COMMUNITY)
Admission: EM | Admit: 2012-02-15 | Discharge: 2012-02-15 | Disposition: A | Discharge status: Home / Self Care | Payer: Medicaid Other | Source: Home / Self Care | Attending: Family Medicine | Admitting: Family Medicine

## 2012-02-15 DIAGNOSIS — R071 Chest pain on breathing: Secondary | ICD-10-CM

## 2012-02-15 DIAGNOSIS — R0789 Other chest pain: Secondary | ICD-10-CM

## 2012-02-15 MED ORDER — DICLOFENAC POTASSIUM 50 MG PO TABS: 50.0000 mg | ORAL_TABLET | Freq: Three times a day (TID) | ORAL | Status: DC

## 2012-02-15 NOTE — ED Provider Notes (Signed)
History     CSN: [id number]  Arrival date & time 02/15/12  1450   First MD Initiated Contact with Patient 02/15/12 1458      Chief Complaint  Patient presents with  . Chest Pain    (Consider location/radiation/quality/duration/timing/severity/associated sxs/prior treatment) Patient is a 41 y.o. female presenting with chest pain. The history is provided by the patient.  Chest Pain The chest pain began 6 - 12 hours ago. The chest pain is improving. Associated with: awoke this am after sleeping on couch last eve, right sided cp, reproducible to palpation and bending over and twisting. The quality of the pain is described as sharp. The pain does not radiate. Chest pain is worsened by certain positions. Pertinent negatives for primary symptoms include no fever, no shortness of breath, no cough and no palpitations.     History reviewed. No pertinent past medical history.  History reviewed. No pertinent past surgical history.  No family history on file.  History  Substance Use Topics  . Smoking status: Never Smoker   . Smokeless tobacco: Not on file  . Alcohol Use: Yes    OB History    Grav Para Term Preterm Abortions TAB SAB Ect Mult Living                  Review of Systems  Constitutional: Negative.  Negative for fever.  Respiratory: Negative for cough, chest tightness and shortness of breath.   Cardiovascular: Positive for chest pain. Negative for palpitations and leg swelling.    Allergies  Review of patient's allergies indicates no known allergies.  Home Medications   Current Outpatient Rx  Name  Route  Sig  Dispense  Refill  . DICLOFENAC POTASSIUM 50 MG PO TABS   Oral   Take 1 tablet (50 mg total) by mouth 3 (three) times daily.   21 tablet   0   . LISINOPRIL-HYDROCHLOROTHIAZIDE 10-12.5 MG PO TABS   Oral   Take 1 tablet by mouth daily.   30 tablet   1     Patient needs appt with PCP for BP check before an ...   . VALACYCLOVIR HCL 500 MG PO TABS  Oral   Take 1 tablet (500 mg total) by mouth daily.   30 tablet   6     BP 110/71  Pulse 99  Temp 98.5 F (36.9 C) (Oral)  Resp 20  SpO2 99%  LMP 02/01/2012  Physical Exam  Nursing note and vitals reviewed. Constitutional: She is oriented to person, place, and time. She appears well-developed and well-nourished. No distress.  Cardiovascular: Normal rate, regular rhythm, normal heart sounds and intact distal pulses.   Pulmonary/Chest: Effort normal and breath sounds normal. She exhibits tenderness.    Abdominal: Soft. Bowel sounds are normal.  Neurological: She is alert and oriented to person, place, and time.  Skin: Skin is warm and dry.    ED Course  Procedures (including critical care time)  Labs Reviewed - No data to display No results found.   1. Acute chest wall pain       MDM          Linna Hoff, MD 02/15/12 [phone]

## 2012-02-15 NOTE — ED Notes (Signed)
Pt  Reports  Chest  Pain      Which  Started  This  Am     Pt  Worse  On  Certain       Movements  And    When  She  Takes  A  Deep  Breath             Symptoms  Started  This  Am     Skin is  Warm  And     Dry        History  Of  gerd

## 2012-02-19 HISTORY — PX: CRYOABLATION: SHX1415

## 2012-04-15 ENCOUNTER — Ambulatory Visit (INDEPENDENT_AMBULATORY_CARE_PROVIDER_SITE_OTHER): Payer: Medicaid Other | Admitting: Family Medicine

## 2012-04-15 VITALS — BP 119/75 | HR 90 | Ht 60.0 in | Wt 190.0 lb

## 2012-04-15 DIAGNOSIS — A6000 Herpesviral infection of urogenital system, unspecified: Secondary | ICD-10-CM

## 2012-04-15 DIAGNOSIS — I10 Essential (primary) hypertension: Secondary | ICD-10-CM

## 2012-04-15 MED ORDER — LISINOPRIL-HYDROCHLOROTHIAZIDE 10-12.5 MG PO TABS: 1.0000 | ORAL_TABLET | Freq: Every day | ORAL | Status: DC

## 2012-04-15 MED ORDER — VALACYCLOVIR HCL 500 MG PO TABS: 500.0000 mg | ORAL_TABLET | Freq: Every day | ORAL | Status: DC

## 2012-04-15 NOTE — Assessment & Plan Note (Signed)
Is no longer using this daily as ppx, only uses with outbreaks which are "infrequent" per the pt.

## 2012-04-15 NOTE — Patient Instructions (Signed)
It was nice to see you today.  Your blood pressure looks great.  Keep taking the same medicine.  Come back for your well woman exam and fasting labs.  We will check your kidneys, cholesterol, and sugar.  Try to make this a morning appointment and don't eat or drink anything other than water after midnight.

## 2012-04-15 NOTE — Progress Notes (Signed)
S: Pt comes in today for med refill.  HYPERTENSION BP: 119/75 Meds: HCTZ/lisino 12.5/10 Taking meds: Yes     # of doses missed/week: 0 Symptoms: Headache: No Dizziness: No Vision changes: No SOB:  No Chest pain: No LE swelling: No Tobacco use: No    ROS: Per HPI  History  Smoking status  . Never Smoker   Smokeless tobacco  . Not on file    O:  Filed Vitals:   04/15/12 1445  BP: 119/75  Pulse: 90    Gen: NAD CV: RRR, no murmur Pulm: CTA bilat, no wheezes or crackles Ext: Warm, no edema   A/P: 42 y.o. female p/w HTN -See problem list -f/u for CPE

## 2012-04-15 NOTE — Assessment & Plan Note (Signed)
Well controlled on lisino/HCTZ 10/12.5.  No changes today.  Pt has not had lab work since 2010 but would like to defer until she returns for fasting labs with her CPE.  Needs at the very least a BMET to check Cr, K.

## 2012-05-04 ENCOUNTER — Other Ambulatory Visit (HOSPITAL_COMMUNITY)
Admission: RE | Admit: 2012-05-04 | Discharge: 2012-05-04 | Disposition: A | Discharge status: Home / Self Care | Payer: Medicaid Other | Source: Ambulatory Visit | Attending: Family Medicine | Admitting: Family Medicine

## 2012-05-04 ENCOUNTER — Ambulatory Visit (INDEPENDENT_AMBULATORY_CARE_PROVIDER_SITE_OTHER): Payer: Medicaid Other | Admitting: Family Medicine

## 2012-05-04 ENCOUNTER — Encounter: Payer: Self-pay | Admitting: Family Medicine

## 2012-05-04 VITALS — BP 131/82 | HR 81 | Ht 60.0 in | Wt 189.0 lb

## 2012-05-04 DIAGNOSIS — Z01419 Encounter for gynecological examination (general) (routine) without abnormal findings: Secondary | ICD-10-CM | POA: Insufficient documentation

## 2012-05-04 DIAGNOSIS — E669 Obesity, unspecified: Secondary | ICD-10-CM

## 2012-05-04 DIAGNOSIS — E0500 Thyrotoxicosis with diffuse goiter without thyrotoxic crisis or storm: Secondary | ICD-10-CM

## 2012-05-04 DIAGNOSIS — I10 Essential (primary) hypertension: Secondary | ICD-10-CM

## 2012-05-04 DIAGNOSIS — Z1151 Encounter for screening for human papillomavirus (HPV): Secondary | ICD-10-CM | POA: Insufficient documentation

## 2012-05-04 DIAGNOSIS — N72 Inflammatory disease of cervix uteri: Secondary | ICD-10-CM

## 2012-05-04 DIAGNOSIS — Z0000 Encounter for general adult medical examination without abnormal findings: Secondary | ICD-10-CM

## 2012-05-04 DIAGNOSIS — Z124 Encounter for screening for malignant neoplasm of cervix: Secondary | ICD-10-CM

## 2012-05-04 DIAGNOSIS — R87810 Cervical high risk human papillomavirus (HPV) DNA test positive: Secondary | ICD-10-CM | POA: Insufficient documentation

## 2012-05-04 DIAGNOSIS — Z113 Encounter for screening for infections with a predominantly sexual mode of transmission: Secondary | ICD-10-CM | POA: Insufficient documentation

## 2012-05-04 LAB — COMPREHENSIVE METABOLIC PANEL
ALT: 9 U/L (ref 0–35)
AST: 10 U/L (ref 0–37)
Albumin: 4.1 g/dL (ref 3.5–5.2)
Alkaline Phosphatase: 42 U/L (ref 39–117)
BUN: 10 mg/dL (ref 6–23)
Potassium: 3.9 mEq/L (ref 3.5–5.3)

## 2012-05-04 LAB — CBC
Hemoglobin: 8.5 g/dL — ABNORMAL LOW (ref 12.0–15.0)
MCH: 18.6 pg — ABNORMAL LOW (ref 26.0–34.0)
MCV: 65.4 fL — ABNORMAL LOW (ref 78.0–100.0)
Platelets: 443 10*3/uL — ABNORMAL HIGH (ref 150–400)
RBC: 4.57 MIL/uL (ref 3.87–5.11)

## 2012-05-04 LAB — LIPID PANEL
LDL Cholesterol: 93 mg/dL (ref 0–99)
Triglycerides: 81 mg/dL (ref <150)
VLDL: 16 mg/dL (ref 0–40)

## 2012-05-04 NOTE — Assessment & Plan Note (Signed)
?  not on any medications. Check TSH.

## 2012-05-04 NOTE — Patient Instructions (Signed)
It was good to see you today.  We are checking some labs-- I will send you a letter with those results.  I will call if there is anything abnormal.  You do not need another Pap smear for 5 years, as long as this one is normal!  Come back in 3 months for your next blood pressure check, sooner for any issues.

## 2012-05-04 NOTE — Assessment & Plan Note (Signed)
Check FLP, fasting CMET, CBC.

## 2012-05-04 NOTE — Assessment & Plan Note (Signed)
Pap done, next due in 5 years if negative. Also checked GC/Chlam per pt request. Declines HIV/RPR today.

## 2012-05-04 NOTE — Assessment & Plan Note (Signed)
BP at goal, continue lisino/HCTZ 10/12.5 Check CMET.

## 2012-05-04 NOTE — Progress Notes (Signed)
  Subjective:     Valerie Bauer is a 42 y.o. female and is here for a comprehensive physical exam. The patient reports no problems.  Vaginal discharge-- not sexually active in over 1 month; has been having it off and on since that time, no smell; no irregular bleeding; no pelvic pain; no N/V  History   Social History  . Marital Status: Single    Spouse Name: N/A    Number of Children: N/A  . Years of Education: N/A   Occupational History  . Not on file.   Social History Main Topics  . Smoking status: Never Smoker   . Smokeless tobacco: Not on file  . Alcohol Use: Yes  . Drug Use: Not on file  . Sexually Active: Not on file   Other Topics Concern  . Not on file   Social History Narrative  . No narrative on file   Health Maintenance  Topic Date Due  . Influenza Vaccine  10/20/1970  . Pap Smear  06/30/1988  . Tetanus/tdap  03/21/2016    The following portions of the patient's history were reviewed and updated as appropriate: allergies, current medications, past family history, past medical history, past social history, past surgical history and problem list.  Review of Systems Constitutional: negative for fatigue, fevers, night sweats and weight loss Ears, nose, mouth, throat, and face: negative for hearing loss, hoarseness, tinnitus and voice change Respiratory: negative for cough, dyspnea on exertion and wheezing Cardiovascular: negative for chest pain, chest pressure/discomfort, exertional chest pressure/discomfort, lower extremity edema, palpitations and syncope Gastrointestinal: negative for abdominal pain, change in bowel habits, constipation, diarrhea, melena, nausea and vomiting Genitourinary:positive for vaginal discharge, negative for abnormal menstrual periods and dysuria Musculoskeletal:negative for back pain Neurological: negative for coordination problems, dizziness, gait problems and headaches Endocrine: negative for diabetic symptoms including polydipsia  and polyuria and temperature intolerance   Objective:    BP 131/82  Pulse 81  Ht 5' (1.524 m)  Wt 189 lb (85.73 kg)  BMI 36.91 kg/m2  LMP 04/18/2012 General appearance: alert, cooperative, appears stated age and no distress Head: Normocephalic, without obvious abnormality, atraumatic Eyes: conjunctivae/corneas clear. PERRL, EOM's intact. Fundi benign. Nose: Nares normal. Septum midline. Mucosa normal. No drainage or sinus tenderness. Throat: lips, mucosa, and tongue normal; teeth and gums normal Neck: no adenopathy, no carotid bruit, supple, symmetrical, trachea midline and thyroid not enlarged, symmetric, no tenderness/mass/nodules Lungs: clear to auscultation bilaterally Heart: regular rate and rhythm, S1, S2 normal, no murmur, click, rub or gallop Abdomen: soft, non-tender; bowel sounds normal; no masses,  no organomegaly Pelvic: cervix normal in appearance, external genitalia normal, no adnexal masses or tenderness, no cervical motion tenderness, rectovaginal septum normal, uterus normal size, shape, and consistency and vagina normal without discharge Extremities: extremities normal, atraumatic, no cyanosis or edema Skin: Skin color, texture, turgor normal. No rashes or lesions Neurologic: Grossly normal    Assessment:    Healthy female exam.      Plan:     See After Visit Summary for Counseling Recommendations

## 2012-05-07 ENCOUNTER — Encounter: Payer: Self-pay | Admitting: Family Medicine

## 2014-01-19 ENCOUNTER — Other Ambulatory Visit: Payer: Self-pay | Admitting: Oncology

## 2014-03-09 ENCOUNTER — Other Ambulatory Visit (HOSPITAL_COMMUNITY)
Admission: RE | Admit: 2014-03-09 | Discharge: 2014-03-09 | Disposition: A | Discharge status: Home / Self Care | Payer: 59 | Source: Ambulatory Visit | Attending: Family Medicine | Admitting: Family Medicine

## 2014-03-09 ENCOUNTER — Encounter: Payer: Self-pay | Admitting: Family Medicine

## 2014-03-09 ENCOUNTER — Ambulatory Visit (INDEPENDENT_AMBULATORY_CARE_PROVIDER_SITE_OTHER): Payer: 59 | Admitting: Family Medicine

## 2014-03-09 VITALS — BP 156/81 | HR 91 | Temp 98.6°F | Ht 60.0 in | Wt 191.8 lb

## 2014-03-09 DIAGNOSIS — Z23 Encounter for immunization: Secondary | ICD-10-CM

## 2014-03-09 DIAGNOSIS — R87810 Cervical high risk human papillomavirus (HPV) DNA test positive: Secondary | ICD-10-CM | POA: Insufficient documentation

## 2014-03-09 DIAGNOSIS — A6000 Herpesviral infection of urogenital system, unspecified: Secondary | ICD-10-CM

## 2014-03-09 DIAGNOSIS — N898 Other specified noninflammatory disorders of vagina: Secondary | ICD-10-CM

## 2014-03-09 DIAGNOSIS — I10 Essential (primary) hypertension: Secondary | ICD-10-CM

## 2014-03-09 DIAGNOSIS — Z1151 Encounter for screening for human papillomavirus (HPV): Secondary | ICD-10-CM | POA: Insufficient documentation

## 2014-03-09 DIAGNOSIS — Z01419 Encounter for gynecological examination (general) (routine) without abnormal findings: Secondary | ICD-10-CM

## 2014-03-09 DIAGNOSIS — E669 Obesity, unspecified: Secondary | ICD-10-CM

## 2014-03-09 DIAGNOSIS — Z0000 Encounter for general adult medical examination without abnormal findings: Secondary | ICD-10-CM

## 2014-03-09 LAB — BASIC METABOLIC PANEL
BUN: 9 mg/dL (ref 6–23)
CALCIUM: 9.4 mg/dL (ref 8.4–10.5)
CO2: 26 mEq/L (ref 19–32)
Chloride: 103 mEq/L (ref 96–112)
Creat: 0.59 mg/dL (ref 0.50–1.10)
GLUCOSE: 138 mg/dL — AB (ref 70–99)
Potassium: 4.1 mEq/L (ref 3.5–5.3)
SODIUM: 137 meq/L (ref 135–145)

## 2014-03-09 LAB — POCT WET PREP (WET MOUNT): CLUE CELLS WET PREP WHIFF POC: POSITIVE

## 2014-03-09 MED ORDER — LISINOPRIL-HYDROCHLOROTHIAZIDE 10-12.5 MG PO TABS: 1.0000 | ORAL_TABLET | Freq: Every day | ORAL | Status: DC

## 2014-03-09 MED ORDER — METRONIDAZOLE 500 MG PO TABS: 500.0000 mg | ORAL_TABLET | Freq: Two times a day (BID) | ORAL | Status: DC

## 2014-03-09 MED ORDER — VALACYCLOVIR HCL 500 MG PO TABS: 500.0000 mg | ORAL_TABLET | Freq: Every day | ORAL | Status: DC

## 2014-03-09 NOTE — Progress Notes (Signed)
   Subjective:    Patient ID: Valerie Bauer, female    DOB: 1970/09/20, 44 y.o.   MRN: [id number]  Patient presents for annual physical.  HPI  CHRONIC HTN: Reports - Self-discontinued Lisinopril-HCTZ 10-12.5mg  daily about 1 year ago due to non-adherence Current Meds - none   Previously tolerated medication well without side-effects. Lifestyle - no regular exercise and no dietary plan. Denies CP, dyspnea, HA, edema, dizziness / lightheadedness  OBESITY: - Concerned about weight gain, however, chart review with stable weight, currently at 191 lb (stable at 190 lb for past 2 years, and minimal wt gain +5 lb in >7 years) - Today admits to interest in losing weight. Has not tried any form of weight loss intervention. No current diet or exercise plan. She believes poor diet may be contributing with difficulty "controlling portion size" and often "eats until full" - Admits sedentary lifestyle - Interested in starting regular exercise program, consider gym  GENITAL HERPES: - Currently not sexually active (not dating, previously divorced), usually has outbreaks regularly timed with menstrual cycle, however no recent outbreaks, last 2-3 months ago. Previously on Valacyclovir for suppression, has been off of this for few months.  HM: - Last Pap (2014, negative for malignancy, but POSITIVE high risk HPV) - Last Mammogram (diagnostic b/l mammo 11/2011: BI-RADS cat 2, with 2 stable masses x 2 years, dx cyst and tubular adenoma via biopsy)  Family Hx: - Mother passed 12/2013 (age >2) from Stage 4 Breast Cancer (recurrence, after previous treatment)  I have reviewed and updated the following as appropriate: allergies and current medications  Social Hx: - Currently employed at [employer], works at home - Never smoker, social alcohol (not weekly)  Review of Systems  See above HPI    Objective:   Physical Exam  BP 156/81 mmHg  Pulse 91  Temp(Src) 98.6 F (37 C) (Oral)  Ht 5'  (1.524 m)  Wt 191 lb 12.8 oz (87 kg)  BMI 37.46 kg/m2  LMP 03/01/2014  Gen - obese, well-appearing, NAD HEENT - NCAT, oropharynx clear, MMM Neck - supple, non-tender, no thyromegaly Heart - RRR, no murmurs heard Lungs - CTAB. Normal work of breathing. Ext - non-tender, no edema, peripheral pulses intact +2 b/l Skin - warm, dry, no rashes Neuro - awake, alert Pelvic Exam - Normal external female genitalia. Vaginal canal without lesions. Normal appearing cervix, without lesions or bleeding. Increased thin gray discharge with +odor on exam. Bimanual exam without masses or cervical motion tenderness.  Pelvic exam chaperoned by CMA.     Assessment & Plan:   See specific A&P problem list for details.

## 2014-03-09 NOTE — Assessment & Plan Note (Signed)
Due for Mammogram (no new symptoms, monitored masses s/p biopsy) and Pap smear + HPV co-testing  Plan: 1. Advised pt to call and schedule next Mammogram appointment (contact [facility] if any difficulty with scheduling, previously had diagnostic mammo) 2. Performed Pap with HPV testing today

## 2014-03-09 NOTE — Assessment & Plan Note (Signed)
Chronic genital herpes, infrequently using Valacyclovir for suppresion  Plan: 1. Refilled Valacyclovir 500mg  daily for suppression, plans to resume otherwise can continue PRN for outbreaks if successful 2. Patient opted out of HIV testing, as well as other STD work-up

## 2014-03-09 NOTE — Assessment & Plan Note (Signed)
Obesity with BMI 37, overall unchanged wt over past 2 years (190 lbs) and stable wt x 7 years. - Interested in weight loss, no prior attempts at wt loss or current plans  Plan: 1. Start with diet and exercise lifestyle modifications 2. Patient set own goal for regular exercise regimen with gym cardio on treadmill 15-19min x 3 days weekly 3. Discussed general dietary goals - reduce portion size, 3 regular meals, 2 snacks (between meals), replace carb portion with vegetable 4. RTC to discuss weight loss plans more in future, interested in referral to Dr. Jenne Campus

## 2014-03-09 NOTE — Patient Instructions (Signed)
Dear Valerie Bauer, Thank you for coming in to clinic today. It was good to meet you!  Please resume your daily BP medication, sent to your pharmacy. Reduce salt in diet. Start exercise goal as we discussed with treadmill 15 min 3x weekly, we will work on goals together from here. Work on dietary plan with 3 regular meals, snack between each meal (high protein, nuts, cheese, dairy, yogurt, or fresh vegetable), reduce carbs with meals (bread potatoes etc, and inc fresh vegetables) Reduce sodas or tea, any drink with calories, increase water. Blood work today, and Pap Smear collected - you will be notified with results can go over in more detail at next follow-up.  Please call to schedule your Mammogram - sheet given today  Flu shot today  Please schedule a follow-up appointment with Dr. Parks Ranger in 1-3 months to follow-up lifestyle changes and results.  If you have any other questions or concerns, please feel free to call the clinic to contact me. You may also schedule an earlier appointment if necessary.  However, if your symptoms get significantly worse, please go to the Emergency Department to seek immediate medical attention.  Valerie Bauer, [place]

## 2014-03-09 NOTE — Assessment & Plan Note (Signed)
Consistent with BV clinically on pelvic exam for pap smear, collected wet prep (positive with many clue cells and +whiff), otherwise mostly asymptomatic other than inc vaginal discharge.  Plan: 1. Metronidazole 500mg  BID x 7 days, pt notified of results

## 2014-03-09 NOTE — Assessment & Plan Note (Addendum)
Last Pap 2014 with negative malignancy but detected positive high risk HPV. No prior known abnormal pap results. Exam with normal appearing cervix.  Plan: 1. Pap smear today, with HPV testing added

## 2014-03-09 NOTE — Assessment & Plan Note (Addendum)
Elevated BP today, currently off of anti-HTN meds x 1 year, poor lifestyle  No complications   Plan:  1. Re-order Lisinopril-HCTZ 10-12.5mg  daily 2. Check BMET 3. Lifestyle Mods - Set goals for regular exercise, dietary mods for weight loss 4. RTC 1-3 months, re-check BP, may need to titrate up to 20-25mg  daily

## 2014-03-10 ENCOUNTER — Telehealth: Payer: Self-pay | Admitting: *Deleted

## 2014-03-10 NOTE — Telephone Encounter (Signed)
Pt informed. Deseree Blount, CMA  

## 2014-03-10 NOTE — Telephone Encounter (Signed)
-----   Message from Valerie Putnam, DO sent at 03/09/2014  9:45 PM EST ----- Regarding: Notify patient of results from 03/09/14 OV Hi Red Team,  I saw Valerie Bauer on 03/09/14 and her Wet Prep was positive for Bacterial Vaginosis. She had left prior to receiving results and I was unable to call from clinic.  Please call her to notify her of positive BV, and that I have already sent in Metronidazole (Flagyl), instructions take 1 tablet twice daily for 7 days (avoid alcohol on med).  Recommend that she schedules follow-up in about 1-3 months as planned, and we will notify her with her results for labs and Pap by mail or phone over next 1-2 weeks.  Thanks, Valerie Bauer, [place], PGY-2

## 2014-03-11 LAB — CYTOLOGY - PAP

## 2014-03-18 ENCOUNTER — Telehealth: Payer: Self-pay | Admitting: Family Medicine

## 2014-03-18 NOTE — Telephone Encounter (Signed)
Last seen [facility] 03/09/14 by me. Pap smear collected at that time due to history of last Pap 2014 (negative malignancy but positive high risk HPV). Repeat co-testing pap smear from 03/09/14 resulted with negative malignancy (although note absent transformation zone), and persistent positive high risk HPV. After reviewing these results an add on lab for HPV Genotyping 16 & 18 was ordered. Patient was notified regarding these pending results.  Today received lab results from 03/09/14 Pap showing NEGATIVE HPV 16 and 18 genotyping. Discussed case with preceptor Dr. Nori Riis, and according to screening guidelines patient could repeat Pap and HPV co-testing in 1 year vs colposcopy (given repeat positive high risk HPV testing 2014-2016).  Discussed these options with patient and explained risks / benefits. She elected to pursue Colposcopy for further diagnostic reassurance, and if negative would resume monitoring with Pap + HPV co-testing. Advised to call [facility] and schedule [facility] visit with Dr. Nori Riis at her convenience over next 1-3 months for colposcopy. Pt understood and questions answered.  Nobie Putnam, [place], PGY-2

## 2014-12-09 ENCOUNTER — Ambulatory Visit (INDEPENDENT_AMBULATORY_CARE_PROVIDER_SITE_OTHER): Payer: 59 | Admitting: Obstetrics and Gynecology

## 2014-12-09 ENCOUNTER — Encounter: Payer: Self-pay | Admitting: Obstetrics and Gynecology

## 2014-12-09 VITALS — BP 138/82 | HR 97 | Temp 98.4°F | Wt 190.0 lb

## 2014-12-09 DIAGNOSIS — A6000 Herpesviral infection of urogenital system, unspecified: Secondary | ICD-10-CM | POA: Diagnosis not present

## 2014-12-09 MED ORDER — VALACYCLOVIR HCL 1 G PO TABS: 1000.0000 mg | ORAL_TABLET | Freq: Every day | ORAL | Status: DC

## 2014-12-09 MED ORDER — VALACYCLOVIR HCL 1 G PO TABS: 1000.0000 mg | ORAL_TABLET | Freq: Two times a day (BID) | ORAL | Status: DC

## 2014-12-09 MED ORDER — TRAMADOL HCL 50 MG PO TABS: 50.0000 mg | ORAL_TABLET | Freq: Four times a day (QID) | ORAL | Status: DC | PRN

## 2014-12-09 NOTE — Patient Instructions (Addendum)
Medications sent to pharmacy - acute flare take 1 g twice daily for 10 days (reordered this dose)  Pain medication only as needed   Talk to PCP about suppressive therapy  Genital Herpes Genital herpes is a common sexually transmitted infection (STI) that is caused by a virus. The virus is spread from person to person through sexual contact. Infection can cause itching, blisters, and sores in the genital area or rectal area. This is called an outbreak. It affects both men and women. Genital herpes is particularly concerning for pregnant women because the virus can be passed to the baby during delivery and cause serious problems. Genital herpes is also a concern for people with a weakened defense (immune) system. Symptoms of genital herpes may last several days and then go away. However, the virus remains in your body, so you may have more outbreaks of symptoms in the future. The time between outbreaks varies and can be months or years. CAUSES Genital herpes is caused by a virus called herpes simplex virus (HSV) type 2 or HSV type 1. These viruses are contagious and are most often spread through sexual contact with an infected person. Sexual contact includes vaginal, anal, and oral sex. RISK FACTORS Risk factors for genital herpes include:  Being sexually active with multiple partners.  Having unprotected sex. SIGNS AND SYMPTOMS Symptoms may include:  Pain and itching in the genital area or rectal area.  Small red bumps that turn into blisters and then turn into sores.  Flu-like symptoms, including:  Fever.  Body aches.  Painful urination.  Vaginal discharge. DIAGNOSIS Genital herpes may be diagnosed by:  Physical exam.  Blood test.  Fluid culture test from an open sore. TREATMENT There is no cure for genital herpes. Oral antiviral medicines may be used to speed up healing and to help prevent the return of symptoms. These medicines can also help to reduce the spread of the  virus to sexual partners. HOME CARE INSTRUCTIONS  Keep the affected areas dry and clean.  Take medicines only as directed by your health care provider.  Do not have sexual contact during active infections. Genital herpes is contagious.  Practice safe sex. Latex condoms and female condoms may help to prevent the spread of the herpes virus.  Avoid rubbing or touching the blisters and sores. If you do touch the blister or sores:  Wash your hands thoroughly.  Do not touch your eyes afterward.  If you become pregnant, tell your health care provider if you have had genital herpes.  Keep all follow-up visits as directed by your health care provider. This is important. PREVENTION  Use condoms. Although anyone can contract genital herpes during sexual contact even with the use of a condom, a condom can provide some protection.  Avoid having multiple sexual partners.  Talk to your sexual partner about any symptoms and past history that either of you may have.  Get tested before you have sex. Ask your partner to do the same.  Recognize the symptoms of genital herpes. Do not have sexual contact if you notice these symptoms. SEEK MEDICAL CARE IF:  Your symptoms are not improving with medicine.  Your symptoms return.  You have new symptoms.  You have a fever.  You have abdominal pain.  You have redness, swelling, or pain in your eye. MAKE SURE YOU:  Understand these instructions.  Will watch your condition.  Will get help right away if you are not doing well or get worse.  This information is not intended to replace advice given to you by your health care provider. Make sure you discuss any questions you have with your health care provider.   Document Released: 02/02/2000 Document Revised: 02/25/2014 Document Reviewed: 06/22/2013 Elsevier Interactive Patient Education [employer].

## 2014-12-09 NOTE — Progress Notes (Signed)
   Subjective:   Patient ID: Valerie Bauer, female    DOB: 01-27-1971, 44 y.o.   MRN: [id number]  Patient presents for Same Day Appointment  Chief Complaint  Patient presents with  . breakout    HPI: # Herpes outbreak:  Causing joint pain in groin area  Worse with walking as area rubs against legs  Last outbreak prior to this in [place]  Has been taking valtrex since LMP  Has been on suppressive therapy for herpes before but now only takes with flares  LMP 11/30/2014 -12/05/2014  Lesions starting to calm down  No fevers, no vaginal discharge or bleeding  Pain is 8/10 when ambulating  States she walks a lot and pain unbearable.  Wants to know if work note can be written for work as she is going to request FMLA for a few days when lesions flare up as she is unable to bear walking and pain   Review of Systems   See HPI for ROS.   Past medical history, surgical, family, and social history reviewed and updated in the EMR as appropriate.  Objective:  BP 138/82 mmHg  Pulse 97  Temp(Src) 98.4 F (36.9 C) (Oral)  Wt 190 lb (86.183 kg)  SpO2 98%  LMP 12/01/2014 (Exact Date) Vitals and nursing note reviewed  Physical Exam  Constitutional: She is well-developed, well-nourished, and in no distress.  Cardiovascular: Normal rate.   Pulmonary/Chest: Effort normal.  Genitourinary: Vulva exhibits tenderness.  Left labial erythematous lesions appreciated. No lesions or erythema in inner thigh folds.  Skin: Skin is warm and dry.    Assessment & Plan:  1. Genital herpes: Patient with acute outbreak of HSV-2. Has been taking Valtrex 500mg  daily for flare. Symptoms and lesions still present. Vitals are stable. No signs of infection with genital lesions.  -Will give patient short course of tramadol for pain management  -Work note written for patient - valACYclovir (VALTREX) 1000 MG tablet; Take 1 tablet (1,000 mg total) by mouth 2 (two) times daily.  For acute flare  prescribed.  -with recurrent flares patient may need to go back on suppressive therapy -discussed return precautions with patient if flare does not resolve in 10 days.  -handout given  Luiz Blare, DO 12/09/2014, 11:23 AM PGY-2, [place]

## 2014-12-22 ENCOUNTER — Telehealth: Payer: Self-pay | Admitting: Family Medicine

## 2014-12-22 NOTE — Telephone Encounter (Signed)
Forms placed in PCP box. 

## 2014-12-22 NOTE — Telephone Encounter (Signed)
FMLA papers dropped off to be filled out.  Please fax when completed.

## 2014-12-23 NOTE — Telephone Encounter (Signed)
FMLA Information Received FMLA paperwork for patient Valerie Bauer) for initial request. Diagnosis / Indication: Genital Herpes (HSV-2) chronic infection with recurrent outbreaks Symptoms: Periodic painful outbreak flares Job limitations: Prolonged sitting, ambulation. 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 Reviewed the above information. Reported deadline for faxed FMLA paperwork is 01/01/15. Notified that she may pick up a copy of her FMLA paperwork anytime starting 12/26/14 from front office at [facility]  Completed, signed, and dated FMLA paperwork 12/23/14. Returned to [employer]. to be faxed to number on document, and to be scanned into chart.  Nobie Putnam, [place], PGY-3

## 2014-12-26 NOTE — Telephone Encounter (Signed)
Patient is aware that FMLA forms were faxed per request.  Forms copied for scanning in patient's record.  Patient to pick up original copies.  Derl Barrow, RN

## 2015-02-19 DIAGNOSIS — I10 Essential (primary) hypertension: Secondary | ICD-10-CM

## 2015-02-19 HISTORY — DX: Essential (primary) hypertension: I10

## 2015-04-28 ENCOUNTER — Encounter: Payer: Self-pay | Admitting: Family Medicine

## 2015-04-28 ENCOUNTER — Ambulatory Visit (INDEPENDENT_AMBULATORY_CARE_PROVIDER_SITE_OTHER): Payer: 59 | Admitting: Family Medicine

## 2015-04-28 VITALS — BP 143/71 | HR 102 | Temp 98.3°F | Ht 60.0 in | Wt 186.4 lb

## 2015-04-28 DIAGNOSIS — I10 Essential (primary) hypertension: Secondary | ICD-10-CM

## 2015-04-28 DIAGNOSIS — A6000 Herpesviral infection of urogenital system, unspecified: Secondary | ICD-10-CM

## 2015-04-28 DIAGNOSIS — R87810 Cervical high risk human papillomavirus (HPV) DNA test positive: Secondary | ICD-10-CM

## 2015-04-28 DIAGNOSIS — R7309 Other abnormal glucose: Secondary | ICD-10-CM

## 2015-04-28 DIAGNOSIS — N63 Unspecified lump in unspecified breast: Secondary | ICD-10-CM

## 2015-04-28 DIAGNOSIS — Z0000 Encounter for general adult medical examination without abnormal findings: Secondary | ICD-10-CM

## 2015-04-28 DIAGNOSIS — E669 Obesity, unspecified: Secondary | ICD-10-CM

## 2015-04-28 MED ORDER — VALACYCLOVIR HCL 1 G PO TABS: 1000.0000 mg | ORAL_TABLET | Freq: Every day | ORAL | Status: DC

## 2015-04-28 NOTE — Progress Notes (Signed)
Subjective:    Patient ID: Valerie Bauer, female    DOB: [birth year], 45 y.o.   MRN: [id number]  Patient presents for annual physical.  HPI  GENITAL HERPES, RECURRENT: - Chronic history of known recurrent genital HSV outbreaks, prior testing with genital lesion culture resulted in HSV1 (2009) - Today follows up on continued outbreaks with typical painful lesions, last flare 02/2015, none in 03/2015, triggered by menstrual cycle, x 1 monthly. Describes pain as debilitating and limiting prolonged sitting or standing, difficulty functioning and performing her job during time of outbreak. Treated with valtrex PRN flares only, not suppression. Last updated FMLA paperwork in 2016. Today requesting will need updated FMLA papers, did not bring today. - Currently out of Valtrex, needs refill. Usually took 1g daily x 5 days with good relief. - Requesting blood testing for HSV, would like to know if "cured", despite discussion against this notion  CHRONIC HTN: Remains off all medications, never resumed Lisinopril-HCTZ after it was re-ordered 02/2014. Concerned about side-effects. Previously tolerated medication well without side-effects. Denies CP, dyspnea, HA, edema, dizziness / lightheadedness  MORBID OBESITY, BMI >35: - Weight down 3.6 lbs in 5 months, and about 5 lb in >1 year Lifestyle - no regular exercise and no dietary plan - Admits sedentary lifestyle, eating larger portion sizes  H/o HIGH RISK HPV DETECTED - No history of abnormal intra-epithelial cells or malignancy identified on pap smears. But significant h/o of prior pap smear x 2 with High Risk HPV detected, initially 04/2012 and then repeat in 02/2014, last pap sent for HPV genotyping and NEGATIVE for HPV 16 & 18/45. Recommended colposcopy but patient never scheduled. - Not sexually active - No family history of cervical cancer  HM: - Last Pap 02/2014, see above - Last Mammogram (diagnostic b/l mammo 11/2011: BI-RADS cat 2, with 2  stable masses x 2 years, dx cyst and tubular adenoma via biopsy), +Family History of mother passing from Br CA >age 35, requesting repeat diagnostic mammogram bilateral. - Declined HIV screen  Social History  Substance Use Topics  . Smoking status: Never Smoker   . Smokeless tobacco: None  . Alcohol Use: Yes - infrequent   - Currently employed at [employer]  See above HPI    Objective:   Physical Exam  Constitutional: She appears well-developed and well-nourished. No distress.  Obese, well-appearing, comfortable, cooperative  HENT:  Head: Normocephalic and atraumatic.  Mouth/Throat: Oropharynx is clear and moist. No oropharyngeal exudate.  Eyes: Conjunctivae are normal.  Neck: Normal range of motion. Neck supple. No thyromegaly present.  Cardiovascular: Regular rhythm, normal heart sounds and intact distal pulses.   No murmur heard. Mild tachycardic  Pulmonary/Chest: Effort normal and breath sounds normal. No respiratory distress. She has no wheezes. She has no rales.  Abdominal: Soft. Bowel sounds are normal. She exhibits no distension and no mass. There is no tenderness.  Genitourinary:  Deferred Pelvic Exam today, denied active lesions.  Musculoskeletal: Normal range of motion. She exhibits no edema.  Lymphadenopathy:    She has no cervical adenopathy.  Neurological: She is alert.  Skin: Skin is warm and dry. No rash noted. She is not diaphoretic.  Psychiatric: She has a normal mood and affect. Her behavior is normal.  Nursing note and vitals reviewed.  BP 143/71 mmHg  Pulse 102  Temp(Src) 98.3 F (36.8 C) (Oral)  Ht 5' (1.524 m)  Wt 186 lb 6.4 oz (84.55 kg)  BMI 36.40 kg/m2  LMP 04/09/2015  Assessment & Plan:   Problem List Items Addressed This Visit    BREAST MASSES, BILATERAL    Chronic history of prior breast masses, stable and benign on prior diagnostic mammo. Prior history of biopsy. - Last mammo 2013 - Ordered diagnostic  mammo bilateral for routine eval      Relevant Orders   MM Digital Diagnostic Bilat   Cervical high risk HPV (human papillomavirus) test positive    Repeat high risk HPV on last 2 pap smears (2014 and 2016), last with negative genotyping for 16, 18/45 - Did not follow advice in 2016 when counseled to schedule colposcopy - Would like to proceed now with colposcopy, info on how to schedule [facility] / colpo. Alternatively, could discuss about repeat pap with HPV co-testing since the genotyping was negative last time      ESSENTIAL HYPERTENSION, BENIGN    Mildly elevated SBP, remains non-adherent to meds, self DC'd x 2 years now No complications   Plan:  1. Counseled that she will likely need anti-HTN if significant worsening BP, but advised if she is choosing to be off meds, she will need significant lifestyle modifications to replace meds 2. Check future CMET      Relevant Orders   COMPLETE METABOLIC PANEL WITH GFR   Lipid panel   Genital herpes    Stable, chronic recurrent genital herpes, with prior dx HSV1 on culture. Never on suppression therapy.  Plan: 1. Rx Valtrex 1g BID x 3 months, for suppression therapy 2. Will complete FMLA as requested once receive it next week, will still need to miss time monthly for flares 3. Check future HSV1, HSV2 IgG by patient request, despite counseling that given her clinical symptoms and prior HSV1 positive culture, the lab tests are unlikely to change treatment and are not prognostic 4. RTC 2-3 months, may extend suppression therapy up to 1 yr if helping      Relevant Medications   valACYclovir (VALTREX) 1000 MG tablet   Other Relevant Orders   HSV(herpes simplex vrs) 1+2 ab-IgG   Obesity    Gradual improvement with some slight weight loss, despite no regular lifestyle changes - Counseled on importance of healthy diet and regular exercise, especially since non-adherent to BP meds - Offered nutrition referral, declined - Future  labs with A1c, Lipids      Relevant Orders   Lipid panel    Other Visit Diagnoses    Annual physical exam    -  Primary    Other abnormal glucose        Relevant Orders    POCT glycosylated hemoglobin (Hb A1C)      Return in about 2 months (around 06/28/2015) for HSV follow-up.  Nobie Putnam, [place], PGY-3

## 2015-04-28 NOTE — Patient Instructions (Signed)
Thank you for coming in to clinic today.  For Genital herpes, we will do blood testing next week, however start Valtrex 1000mg  every day for suppression therapy, will treat for 2 to 3 months, then see if it is helping, can continue up to a year if helping - Bring FMLA paperwork next time and drop it off, it may take 1-2 weeks to complete FYI  For elevated BP, will check labs,  Start exercise goal as we discussed with treadmill 15 min 3x weekly, we will work on goals together from here. Work on dietary plan with 3 regular meals, snack between each meal (high protein, nuts, cheese, dairy, yogurt, or fresh vegetable), reduce carbs with meals (bread potatoes etc, and inc fresh vegetables) Reduce sodas or tea, any drink with calories, increase water.  Please call to schedule your Diagnostic Mammogram  - Please schedule a "Lab Only" visit (early morning, 8 to 9:00am) to get your blood work drawn here at our clinic. - You need to be fasting (No food or drink after midnight, and nothing in the morning before your blood draw). - I have already ordered your blood work (orders will be good for few months), so you may schedule this appointment at your convenience. - We can discuss results at next appointment, otherwise our office will contact you with results by phone or with a letter  Please schedule a follow-up appointment with Dr. Parks Ranger in 2 to 3 months to follow-up HSV, BP  Please schedule an appointment with [facility] for repeat Pap Smear vs "Colposcopy" due to prior high risk HPV.  If you have any other questions or concerns, please feel free to call the clinic to contact me. You may also schedule an earlier appointment if necessary.  However, if your symptoms get significantly worse, please go to the Emergency Department to seek immediate medical attention.  Nobie Putnam, [place]

## 2015-04-29 NOTE — Assessment & Plan Note (Addendum)
Repeat high risk HPV on last 2 pap smears (2014 and 2016), last with negative genotyping for 16, 18/45 - Did not follow advice in 2016 when counseled to schedule colposcopy - Would like to proceed now with colposcopy, info on how to schedule [facility] / colpo. Alternatively, could discuss about repeat pap with HPV co-testing since the genotyping was negative last time

## 2015-04-29 NOTE — Assessment & Plan Note (Addendum)
Chronic history of prior breast masses, stable and benign on prior diagnostic mammo. Prior history of biopsy. - Last mammo 2013 - Ordered diagnostic mammo bilateral for routine eval

## 2015-04-29 NOTE — Assessment & Plan Note (Addendum)
Stable, chronic recurrent genital herpes, with prior dx HSV1 on culture. Never on suppression therapy.  Plan: 1. Rx Valtrex 1g BID x 3 months, for suppression therapy 2. Will complete FMLA as requested once receive it next week, will still need to miss time monthly for flares 3. Check future HSV1, HSV2 IgG by patient request, despite counseling that given her clinical symptoms and prior HSV1 positive culture, the lab tests are unlikely to change treatment and are not prognostic 4. RTC 2-3 months, may extend suppression therapy up to 1 yr if helping

## 2015-04-29 NOTE — Assessment & Plan Note (Addendum)
Gradual improvement with some slight weight loss, despite no regular lifestyle changes - Counseled on importance of healthy diet and regular exercise, especially since non-adherent to BP meds - Offered nutrition referral, declined - Future labs with A1c, Lipids

## 2015-04-29 NOTE — Assessment & Plan Note (Addendum)
Mildly elevated SBP, remains non-adherent to meds, self DC'd x 2 years now No complications   Plan:  1. Counseled that she will likely need anti-HTN if significant worsening BP, but advised if she is choosing to be off meds, she will need significant lifestyle modifications to replace meds 2. Check future CMET

## 2015-05-01 ENCOUNTER — Other Ambulatory Visit: Payer: Self-pay | Admitting: Family Medicine

## 2015-05-01 DIAGNOSIS — Z1231 Encounter for screening mammogram for malignant neoplasm of breast: Secondary | ICD-10-CM

## 2015-05-04 ENCOUNTER — Other Ambulatory Visit (INDEPENDENT_AMBULATORY_CARE_PROVIDER_SITE_OTHER): Payer: 59

## 2015-05-04 DIAGNOSIS — I10 Essential (primary) hypertension: Secondary | ICD-10-CM

## 2015-05-04 DIAGNOSIS — E669 Obesity, unspecified: Secondary | ICD-10-CM

## 2015-05-04 DIAGNOSIS — A6000 Herpesviral infection of urogenital system, unspecified: Secondary | ICD-10-CM

## 2015-05-04 DIAGNOSIS — R7309 Other abnormal glucose: Secondary | ICD-10-CM | POA: Diagnosis not present

## 2015-05-04 LAB — LIPID PANEL
CHOL/HDL RATIO: 3.3 ratio (ref <=5.0)
CHOLESTEROL: 156 mg/dL (ref 125–200)
HDL: 48 mg/dL (ref >=46)
LDL Cholesterol: 98 mg/dL (ref <130)
Triglycerides: 50 mg/dL (ref <150)
VLDL: 10 mg/dL (ref <30)

## 2015-05-04 LAB — COMPLETE METABOLIC PANEL WITH GFR
ALBUMIN: 3.9 g/dL (ref 3.6–5.1)
ALT: 9 U/L (ref 6–29)
AST: 10 U/L (ref 10–30)
Alkaline Phosphatase: 41 U/L (ref 33–115)
BUN: 10 mg/dL (ref 7–25)
CALCIUM: 8.6 mg/dL (ref 8.6–10.2)
CHLORIDE: 102 mmol/L (ref 98–110)
CO2: 22 mmol/L (ref 20–31)
CREATININE: 0.68 mg/dL (ref 0.50–1.10)
GFR, Est African American: >89 mL/min (ref >=60)
GFR, Est Non African American: >89 mL/min (ref >=60)
GLUCOSE: 203 mg/dL — AB (ref 65–99)
POTASSIUM: 4 mmol/L (ref 3.5–5.3)
SODIUM: 137 mmol/L (ref 135–146)
Total Bilirubin: 0.2 mg/dL (ref 0.2–1.2)
Total Protein: 7.1 g/dL (ref 6.1–8.1)

## 2015-05-04 LAB — POCT GLYCOSYLATED HEMOGLOBIN (HGB A1C): HEMOGLOBIN A1C: 7.7

## 2015-05-05 ENCOUNTER — Telehealth: Payer: Self-pay | Admitting: *Deleted

## 2015-05-05 DIAGNOSIS — N6320 Unspecified lump in the left breast, unspecified quadrant: Secondary | ICD-10-CM

## 2015-05-05 LAB — HSV(HERPES SIMPLEX VRS) I + II AB-IGG
HSV 1 Glycoprotein G Ab, IgG: <0.9 Index (ref <0.90)
HSV 2 Glycoprotein G Ab, IgG: 17.3 Index — ABNORMAL HIGH (ref <0.90)

## 2015-05-05 NOTE — Telephone Encounter (Signed)
[facility] called said they cannot schedule this until because they needed PCP to change order for Diagnostic Bilateral she stated to include TOMO on this order. Also she needs PCP to add [facility] and needs to know the location of the masses that are mentioned in the notes. Forwarding to PCP. Katharina Caper, [address], [place]

## 2015-05-05 NOTE — Telephone Encounter (Signed)
Updated new orders on 05/05/15, reviewed last bilateral diagnostic mammogram in 2013 with 2 breast masses seen only on LEFT side, not right (prior report of bilateral masses was per patient's history). These masses were stable x 2 years, recommend annual mammogram.  I changed diagnosis to masses left breast. Ordered MM Diag Breast Tomo Bilateral, and US Breast Left LTD unilateral inc axilla. No right breast US ordered, as no masses seen before.  Nobie Putnam, [place], PGY-3

## 2015-05-09 ENCOUNTER — Telehealth: Payer: Self-pay | Admitting: Family Medicine

## 2015-05-09 DIAGNOSIS — E119 Type 2 diabetes mellitus without complications: Secondary | ICD-10-CM

## 2015-05-09 DIAGNOSIS — E1165 Type 2 diabetes mellitus with hyperglycemia: Secondary | ICD-10-CM

## 2015-05-09 DIAGNOSIS — IMO0001 Reserved for inherently not codable concepts without codable children: Secondary | ICD-10-CM | POA: Insufficient documentation

## 2015-05-09 NOTE — Telephone Encounter (Signed)
Last office visit 04/28/15 for annual physical. Ordered future fasting labs, patient obtained since and now have reviewed results. Called patient and discussed all results, see below for discussion and plan:  1. Elevated A1c 7.7 - no comparison of prior A1c, but significant elevated glucose previously in low 100s, up to 138 last year, and now 203 on chemistry. Additional history GDM >18 yr ago. Family history DM. Suspected new diagnosis of Type 2 DM, will repeat A1c to confirm. - Additionally patient does admit to urinary frequency, nocturia, and polydipsia. These were symptoms patient was having but did not discuss at her last annual physical.  2. 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 With frequent genital herpes outbreaks, treated with Valtrex, now 04/28/15 just started on continuous Valtrex suppression dosing. Confirmed HSV2 positive (but lab result does not correlate with disease severity or activity). No changes.  3. CMET - Normal electrolytes, Cr, LFTs. Elevatd glucose 203, as above  4. Fasting lipid panel - Normal total chol, TG low and stable 50s, good HDL 48, appropriate LDL 98. ASCVD 10 yr risk ranging from 4 - 8.4% depending on if taking anti-HTN therapy. However with likely DM2 would recommend moderate intensity statin therapy in future.  Plan: 1. Ordered future A1c - repeat confirm new dx type 2 DM. To schedule non-fasting lab only apt on 05/25/15 AM (already scheduled for colposcopy clinic) 2. Follow-up A1c result by phone, regardless of result if still elevated would offer to start Metformin 500mg  BID titration, counseled on benefits/risks of this med already, depending on how lifestyle mods are going, otherwise would refer to DM Nutrition 3. RTC 1 month after 05/25/15 for follow-up DM, discuss future treatment options, follow-up HSV suppression  Nobie Putnam, [place], PGY-3

## 2015-05-23 ENCOUNTER — Other Ambulatory Visit: Payer: Self-pay

## 2015-05-23 ENCOUNTER — Other Ambulatory Visit: Payer: Self-pay | Admitting: Family Medicine

## 2015-05-23 DIAGNOSIS — Z1231 Encounter for screening mammogram for malignant neoplasm of breast: Secondary | ICD-10-CM

## 2015-05-25 ENCOUNTER — Ambulatory Visit (INDEPENDENT_AMBULATORY_CARE_PROVIDER_SITE_OTHER): Payer: 59 | Admitting: Family Medicine

## 2015-05-25 ENCOUNTER — Ambulatory Visit
Admission: RE | Admit: 2015-05-25 | Discharge: 2015-05-25 | Disposition: A | Discharge status: Home / Self Care | Payer: 59 | Source: Ambulatory Visit

## 2015-05-25 VITALS — BP 128/79 | HR 89 | Temp 98.5°F | Ht 60.0 in | Wt 184.8 lb

## 2015-05-25 DIAGNOSIS — E119 Type 2 diabetes mellitus without complications: Secondary | ICD-10-CM

## 2015-05-25 DIAGNOSIS — R87618 Other abnormal cytological findings on specimens from cervix uteri: Secondary | ICD-10-CM

## 2015-05-25 DIAGNOSIS — Z1231 Encounter for screening mammogram for malignant neoplasm of breast: Secondary | ICD-10-CM

## 2015-05-25 LAB — POCT GLYCOSYLATED HEMOGLOBIN (HGB A1C): Hemoglobin A1C: 7.3

## 2015-05-25 NOTE — Progress Notes (Signed)
Patient ID: Valerie Bauer, female   DOB: May 22, 1970, 45 y.o.   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 Pap in November 2009 and there were 2008 normal  Patient is asymptomatic. Has history of genital herpes with last outbreak October 2016.  She is here for colposcopy for abnormal Pap smear as above. Patient given informed consent, signed copy in the chart.  Placed in lithotomy position. Cervix viewed with speculum and colposcope after application of acetic acid.   Colposcopy adequate (entire squamocolumnar junctions seen  in entirety) ?  Yes with some difficulty with use of endocervical forceps we were able to see it. Acetowhite lesions?None Punctation?None Mosaicism?  None Abnormal vasculature?  No Biopsies?No ECC?No Complications? None  COMMENTS: Patient was given post procedure instructions.  \  Clinically normal colposcopy. I would recommend repeat Pap smear in one year. I have discussed these findings with the patient and have discussed the recommended follow-up when she is in agreement.

## 2015-05-30 NOTE — Telephone Encounter (Signed)
Repeat A1c done 05/25/15 about 1 month later, results A1c 7.3 (compared to 7.7). Confirms new dx Type 2 DM.  Called patient, reviewed A1c and prior discussions. She has made several dietary lifestyle modifications, and states she feels better with more fruits / vegetables and better DM diet, tries to reduce carbs and large portions, fatty/greasy fried foods. As discussed before, I had offered Metformin. Mutually we decided to hold off on Metformin, and give her a chance to continue improving her lifestyle modifications. She will be open to medications in future if needed. Will place order for referral to [facility] center and next follow-up in 3 months for repeat A1c with new DM.  Also relayed results of mammogram (05/25/15) negative, next due 1 year. Reviewed results of colposcopy negative, no biopsy, repeat pap smear with HPV in 1 year.  Nobie Putnam, [place], PGY-3

## 2015-05-30 NOTE — Addendum Note (Signed)
Addended by: Olin Hauser on: 05/30/2015 06:06 PM   Modules accepted: Orders

## 2015-07-03 ENCOUNTER — Ambulatory Visit: Payer: 59 | Admitting: Dietician

## 2015-07-03 ENCOUNTER — Telehealth: Payer: Self-pay | Admitting: Family Medicine

## 2015-07-03 NOTE — Telephone Encounter (Addendum)
Called patient today 07/03/15, advised her that we typically require up to 2 weeks to complete forms, especially something like FMLA can even be done in office visit. However, since this is only a renewal of prior forms completed by me in 12/2014, we could complete these today. Also she has been limited due to work hours unable to drop off forms until today. She understood to give [place] more time in advance for next time.  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  Completed, signed, and dated FMLA paperwork 07/03/15. Placed completed FMLA paperwork in [facility] To be Faxed pile, please leave copy for chart to be scanned.  Nobie Putnam, [place], PGY-3

## 2015-07-03 NOTE — Telephone Encounter (Signed)
Pt came by today and dropped off FMLA Papers to fill out. They needed to be filled out as soon as possible since they are due 07/03/15. Forms are in the doctors box. Please fax to number on the front paper and call patient to pick up copy. [email or web address]

## 2016-02-19 DIAGNOSIS — E119 Type 2 diabetes mellitus without complications: Secondary | ICD-10-CM

## 2016-02-19 HISTORY — DX: Type 2 diabetes mellitus without complications: E11.9

## 2016-06-21 ENCOUNTER — Other Ambulatory Visit: Payer: Self-pay | Admitting: Family Medicine

## 2016-06-21 DIAGNOSIS — Z1231 Encounter for screening mammogram for malignant neoplasm of breast: Secondary | ICD-10-CM

## 2016-07-09 ENCOUNTER — Ambulatory Visit
Admission: RE | Admit: 2016-07-09 | Discharge: 2016-07-09 | Disposition: A | Discharge status: Home / Self Care | Payer: 59 | Source: Ambulatory Visit | Attending: Family Medicine | Admitting: Family Medicine

## 2016-07-09 ENCOUNTER — Ambulatory Visit: Payer: 59

## 2016-07-09 DIAGNOSIS — Z1231 Encounter for screening mammogram for malignant neoplasm of breast: Secondary | ICD-10-CM

## 2016-07-12 ENCOUNTER — Ambulatory Visit: Payer: 59

## 2016-07-16 NOTE — Progress Notes (Signed)
Subjective:   Patient ID: Valerie Bauer    DOB: 02-15-1971, 46 y.o. female   MRN: [id number]  CC: "physical"  HPI: Valerie Bauer is a 46 y.o. female who presents to clinic today annual physical and examination. Problems discussed today are as follows:  Physical exam: No concerns today. Says this is for her annual health insurance exam. Non-smoker. Not on chronic medications. Does not exercise regularly. Lives at home alone and eats out frequently as a result.  ROS: Denies fevers or chills, nausea or vomiting, CP, dyspnea, abdominal pain, diarrhea or constipation, melena or hematochezia, headache, change in vision, feelings of anxiety or depression.  Diabetes: States she has not been formally diagnosed but has been told her sugar is "borderline." Denies family history of diabetes. Says her A1c was elevated to 7.3 last visit.  ROS: denies polyuria, polydipsia, polyphagia, change in weight.  Complete ROS performed, see HPI for pertinent.  [place]: HTN, Graves disease, genital herpes, obesity. Smoking status reviewed. Medications reviewed.  Objective:   BP (!) 140/58   Pulse 89   Temp 98.4 F (36.9 C) (Oral)   Ht 5' (1.524 m)   Wt 189 lb 12.8 oz (86.1 kg)   LMP 07/09/2016   SpO2 99%   BMI 37.07 kg/m  Vitals and nursing note reviewed.  General: well nourished, well developed, in no acute distress with non-toxic appearance HEENT: normocephalic, atraumatic, moist mucous membranes Neck: supple, non-tender without lymphadenopathy CV: regular rate and rhythm without murmurs, rubs, or gallops, no lower extremity edema Lungs: clear to auscultation bilaterally with normal work of breathing Abdomen: soft, non-tender, non-distended, no masses or organomegaly palpable, normoactive bowel sounds Skin: warm, dry, no rashes or lesions, cap refill < 2 seconds Extremities: warm and well perfused, normal tone, no ulceration or decreased sensation or change in motor function at LE  b/l  Assessment & Plan:   Primary hypertension Chronic. No medications. Never smoker. Has new onset DM.  --Discussed importance of healthy eating and restricting Na <2000 mg daily --Initiating Losartan 25 mg daily due to DM --Will need to recheck creatinine function at next visit  Type 2 diabetes mellitus without complication ([place]) No medications. Last A1c elevated 7.3 in 05/2015, now 10.0. Needs at least 2 therapies to achieve goal. Likely will need insulin. --Initiating Metformin-ER 500 mg daily, will likely titrate up if tolerating --Starting Lipitor 40 mg daily --Pt to schedule f/u with Dr. Valentina Lucks for med/insulin assistance --Spent majority of time informing pt of DM and importance of exercise and diet --Glucometer ordered with instructions to check fasting and postprandial and log for next 2 weeks --Pneumovax given, annual foot exam performed --RTC in 1 month for reassessment of DM, and recheck creatinine  Encounter for preventative adult health care exam with abnormal findings Has DM by A1c. Non-smoker. H/o genital herpes and graves dz controlled. Has anemia based on last CBC in 2014.  --Will obtain CBC w/ diff, CMP, HIV screen --Tdap booster given --Would consider thyroid evaluation next visit  Orders Placed This Encounter  Procedures  . Tdap vaccine greater than or equal to 7yo IM  . Pneumococcal polysaccharide vaccine 23-valent greater than or equal to 2yo subcutaneous/IM  . CBC with Differential/Platelet  . Comprehensive metabolic panel  . HIV antibody  . HgB A1c   Meds ordered this encounter  Medications  . atorvastatin (LIPITOR) 40 MG tablet    Sig: Take 1 tablet (40 mg total) by mouth daily.    Dispense:  90 tablet  Refill:  3  . losartan (COZAAR) 25 MG tablet    Sig: Take 1 tablet (25 mg total) by mouth daily.    Dispense:  90 tablet    Refill:  3  . metFORMIN (GLUCOPHAGE-XR) 500 MG 24 hr tablet    Sig: Take 1 tablet (500 mg total) by mouth daily with  breakfast.    Dispense:  90 tablet    Refill:  [place], DO [place], PGY-1 07/17/2016 8:50 PM

## 2016-07-17 ENCOUNTER — Ambulatory Visit (INDEPENDENT_AMBULATORY_CARE_PROVIDER_SITE_OTHER): Payer: 59 | Admitting: Family Medicine

## 2016-07-17 ENCOUNTER — Encounter: Payer: Self-pay | Admitting: Family Medicine

## 2016-07-17 VITALS — BP 140/58 | HR 89 | Temp 98.4°F | Ht 60.0 in | Wt 189.8 lb

## 2016-07-17 DIAGNOSIS — Z23 Encounter for immunization: Secondary | ICD-10-CM

## 2016-07-17 DIAGNOSIS — Z1159 Encounter for screening for other viral diseases: Secondary | ICD-10-CM | POA: Insufficient documentation

## 2016-07-17 DIAGNOSIS — Z114 Encounter for screening for human immunodeficiency virus [HIV]: Secondary | ICD-10-CM | POA: Diagnosis not present

## 2016-07-17 DIAGNOSIS — Z0001 Encounter for general adult medical examination with abnormal findings: Secondary | ICD-10-CM | POA: Diagnosis not present

## 2016-07-17 DIAGNOSIS — I10 Essential (primary) hypertension: Secondary | ICD-10-CM

## 2016-07-17 DIAGNOSIS — E119 Type 2 diabetes mellitus without complications: Secondary | ICD-10-CM

## 2016-07-17 LAB — POCT GLYCOSYLATED HEMOGLOBIN (HGB A1C): Hemoglobin A1C: 10

## 2016-07-17 MED ORDER — ATORVASTATIN CALCIUM 40 MG PO TABS: 40.0000 mg | ORAL_TABLET | Freq: Every day | ORAL | 3 refills | Status: DC

## 2016-07-17 MED ORDER — METFORMIN HCL ER 500 MG PO TB24: 500.0000 mg | ORAL_TABLET | Freq: Every day | ORAL | 3 refills | Status: DC

## 2016-07-17 MED ORDER — LOSARTAN POTASSIUM 25 MG PO TABS: 25.0000 mg | ORAL_TABLET | Freq: Every day | ORAL | 3 refills | Status: DC

## 2016-07-17 NOTE — Assessment & Plan Note (Addendum)
Chronic. No medications. Never smoker. Has new onset DM.  --Discussed importance of healthy eating and restricting Na <2000 mg daily --Initiating Losartan 25 mg daily due to DM --Will need to recheck creatinine function at next visit

## 2016-07-17 NOTE — Assessment & Plan Note (Addendum)
No medications. Last A1c elevated 7.3 in 05/2015, now 10.0. Needs at least 2 therapies to achieve goal. Likely will need insulin. --Initiating Metformin-ER 500 mg daily, will likely titrate up if tolerating --Starting Lipitor 40 mg daily --Pt to schedule f/u with Dr. Valentina Lucks for med/insulin assistance --Spent majority of time informing pt of DM and importance of exercise and diet --Glucometer ordered with instructions to check fasting and postprandial and log for next 2 weeks --Pneumovax given, annual foot exam performed --RTC in 1 month for reassessment of DM, and recheck creatinine

## 2016-07-17 NOTE — Patient Instructions (Addendum)
Thank you for coming in to see [place] today. Please see below to review our plan for today's visit.  1. Your A1c is elevated at 10.0 indicating you have diabetes. It is important to control it now while it is early to help prevent the complications later life. It is important that you continue to watch your diet and decrease the frequency of eating out and excessive carbohydrates (rice, pasta, breads, potatoes). In addition, you need to try to incorporate some form of exercise to daily routine. Your body was made to move so move it! We will start a common medication for diabetes called metformin. Below is some information on this medication.  2. We will also start you on a blood pressure medication called losartan due to your elevated blood pressures and diagnosis of diabetes. This will help protect your kidneys and chronic irreversible damage. I will also need to recheck your kidney function in one month when you return to the clinic. In addition, we will start a cholesterol medication called Lipitor. This will help reduce inflammation or event coronary artery disease. See below for information on these medications. 3. You're now up to date on your vaccinations. I will notify you of your blood tests. 4. When you leave, please schedule a diabetes follow-up with Dr. Valentina Lucks, our pharmacist who can discuss options for your diabetes management. I would like you to be seen in the next few weeks prior to your next appointment with me. 5. We will provide you with a glucometer so that you can check your glucose levels daily. This should be done before breakfast and 1 hour after meals. Please log these daily for the next 2 weeks so that we have an accurate assessment of when your sugar is high. 5. Return to the clinic in one month.  Please call the clinic at [phone] if your symptoms worsen or you have any concerns. It was my pleasure to see you. -- [name], [place],  PGY-1  Canagliflozin; Metformin extended-release tablets What is this medicine? CANAGLIFLOZIN; METFORMIN (KAN a gli FLOE zin; met FOR min) is a combination of 2 medicines used to treat type 2 diabetes. This medicine lowers blood sugar. Treatment is combined with a balanced diet and exercise. This medicine may be used for other purposes; ask your health care provider or pharmacist if you have questions. COMMON BRAND NAME(S): Invokamet XR What should I tell my health care provider before I take this medicine? They need to know if you have any of these conditions: -anemia -artery disease -dehydration -diabetic ketoacidosis -diet low in salt -eating less due to illness, surgery, dieting, or any other reason -foot sores -having surgery -heart disease -high cholesterol -high levels of potassium in the blood -history of amputation -history of pancreatitis or pancreas problems -history of yeast infection of the penis or vagina -if you often drink alcohol -infections in the bladder, kidneys, or urinary tract -kidney disease -liver disease -low blood pressure -nerve damage -on hemodialysis -polycystic ovary syndrome -problems urinating -serious infection or injury -type 1 diabetes -uncircumcised female -vomiting -an unusual or allergic reaction to canagliflozin, metformin, other medicines, foods, dyes, or preservatives -pregnant or trying to get pregnant -breast-feeding How should I use this medicine? Take this medicine by mouth with a glass of water. Take this medicine with food. Swallow whole, do not crush or chew. Follow the directions on the prescription label. Take your doses at regular intervals. Do not take your medicine more often than directed.  Do not stop taking except on your doctor's advice. A special MedGuide will be given to you by the pharmacist with each prescription and refill. Be sure to read this information carefully each time. Talk to your pediatrician regarding the  use of this medicine in children. Special care may be needed. Overdosage: If you think you have taken too much of this medicine contact a poison control center or emergency room at once. NOTE: This medicine is only for you. Do not share this medicine with others. What if I miss a dose? If you miss a dose, take it as soon as you can. If it is almost time for your next dose, take only that dose. Do not take double or extra doses. What may interact with this medicine? Do not take this medicine with any of the following medications: -dofetilide -gatifloxacin -certain contrast medicines given before X-rays, CT scans, MRI, or other procedures This medicine may also interact with the following medications: -acetazolamide -alcohol -certain antiviral medicines for HIV infection or hepatitis -cimetidine -crizotinib -digoxin -diuretics -female hormones, like estrogens or progestins and birth control pills -glycopyrrolate -isoniazid -lamotrigine -medicines for blood pressure, heart disease, irregular heart beat -memantine -methazolamide -midodrine -morphine -nicotinic acid -phenobarbital -phenothiazines like chlorpromazine, mesoridazine, prochlorperazine, thioridazine -phenytoin -procainamide -propantheline -quinidine -quinine -ranitidine -ranolazine -rifampin -ritonavir -steroid medicines like prednisone or cortisone -stimulant medicines for attention disorders, weight loss, or to stay awake -thyroid medicines -topiramate -trimethoprim -trospium -vancomycin -vandetanib -zonisamide This list may not describe all possible interactions. Give your health care provider a list of all the medicines, herbs, non-prescription drugs, or dietary supplements you use. Also tell them if you smoke, drink alcohol, or use illegal drugs. Some items may interact with your medicine. What should I watch for while using this medicine? Visit your doctor or health care professional for regular checks  on your progress. This medicine can cause a serious condition in which there is too much acid in the blood. If you develop nausea, vomiting, stomach pain, unusual tiredness, or breathing problems, stop taking this medicine and call your doctor right away. If possible, use a ketone dipstick to check for ketones in your urine. A test called the HbA1C (A1C) will be monitored. This is a simple blood test. It measures your blood sugar control over the last 2 to 3 months. You will receive this test every 3 to 6 months. Learn how to check your blood sugar. Learn the symptoms of low and high blood sugar and how to manage them. Always carry a quick-source of sugar with you in case you have symptoms of low blood sugar. Examples include hard sugar candy or glucose tablets. Make sure others know that you can choke if you eat or drink when you develop serious symptoms of low blood sugar, such as seizures or unconsciousness. They must get medical help at once. What side effects may I notice from receiving this medicine? Side effects that you should report to your doctor or health care professional as soon as possible: -allergic reactions like skin rash, itching or hives, swelling of the face, lips, or tongue -breathing problems -chest pain -dizziness -feeling faint or lightheaded, falls -muscle aches or pains -muscle weakness -nausea, vomiting, unusual stomach upset or pain -new pain or tenderness, change in skin color, sores or ulcers, or infection in legs or feet -signs and symptoms of low blood sugar such as feeling anxious, confusion, dizziness, increased hunger, unusually weak or tired, sweating, shakiness, cold, irritable, headache, blurred vision, fast heartbeat, loss of  consciousness -signs and symptoms of a urinary tract infection, such as fever, chills, a burning feeling when urinating, blood in the urine, back pain -trouble passing urine or change in the amount of urine, including an urgent need to  urinate more often, in larger amounts, or at night -penile discharge, itching, or pain in men -slow, irregular heartbeat -unusually tired or weak -vaginal discharge, itching, or odor in women Side effects that usually do not require medical attention (report to your doctor or health care professional if they continue or are bothersome): -constipation -diarrhea -headache -heartburn -mild increase in urination -stomach gas -thirsty This list may not describe all possible side effects. Call your doctor for medical advice about side effects. You may report side effects to FDA at 1-800-FDA-1088. Where should I keep my medicine? Keep out of the reach of children. Store at room temperature between 15 and 30 degrees C (59 and 86 degrees F). Throw away any unused medicine after the expiration date. NOTE: This sheet is a summary. It may not cover all possible information. If you have questions about this medicine, talk to your doctor, pharmacist, or health care provider.  2018 Elsevier/Gold Standard (2015-07-04 14:22:09)   Losartan tablets What is this medicine? LOSARTAN (loe SAR tan) is used to treat high blood pressure and to reduce the risk of stroke in certain patients. This drug also slows the progression of kidney disease in patients with diabetes. This medicine may be used for other purposes; ask your health care provider or pharmacist if you have questions. COMMON BRAND NAME(S): Cozaar What should I tell my health care provider before I take this medicine? They need to know if you have any of these conditions: -heart failure -kidney or liver disease -an unusual or allergic reaction to losartan, other medicines, foods, dyes, or preservatives -pregnant or trying to get pregnant -breast-feeding How should I use this medicine? Take this medicine by mouth with a glass of water. Follow the directions on the prescription label. This medicine can be taken with or without food. Take your  doses at regular intervals. Do not take your medicine more often than directed. Talk to your pediatrician regarding the use of this medicine in children. Special care may be needed. Overdosage: If you think you have taken too much of this medicine contact a poison control center or emergency room at once. NOTE: This medicine is only for you. Do not share this medicine with others. What if I miss a dose? If you miss a dose, take it as soon as you can. If it is almost time for your next dose, take only that dose. Do not take double or extra doses. What may interact with this medicine? -blood pressure medicines -diuretics, especially triamterene, spironolactone, or amiloride -fluconazole -NSAIDs, medicines for pain and inflammation, like ibuprofen or naproxen -potassium salts or potassium supplements -rifampin This list may not describe all possible interactions. Give your health care provider a list of all the medicines, herbs, non-prescription drugs, or dietary supplements you use. Also tell them if you smoke, drink alcohol, or use illegal drugs. Some items may interact with your medicine. What should I watch for while using this medicine? Visit your doctor or health care professional for regular checks on your progress. Check your blood pressure as directed. Ask your doctor or health care professional what your blood pressure should be and when you should contact him or her. Call your doctor or health care professional if you notice an irregular or fast heart beat.  Women should inform their doctor if they wish to become pregnant or think they might be pregnant. There is a potential for serious side effects to an unborn child, particularly in the second or third trimester. Talk to your health care professional or pharmacist for more information. You may get drowsy or dizzy. Do not drive, use machinery, or do anything that needs mental alertness until you know how this drug affects you. Do not stand  or sit up quickly, especially if you are an older patient. This reduces the risk of dizzy or fainting spells. Alcohol can make you more drowsy and dizzy. Avoid alcoholic drinks. Avoid salt substitutes unless you are told otherwise by your doctor or health care professional. Do not treat yourself for coughs, colds, or pain while you are taking this medicine without asking your doctor or health care professional for advice. Some ingredients may increase your blood pressure. What side effects may I notice from receiving this medicine? Side effects that you should report to your doctor or health care professional as soon as possible: -confusion, dizziness, light headedness or fainting spells -decreased amount of urine passed -difficulty breathing or swallowing, hoarseness, or tightening of the throat -fast or irregular heart beat, palpitations, or chest pain -skin rash, itching -swelling of your face, lips, tongue, hands, or feet Side effects that usually do not require medical attention (report to your doctor or health care professional if they continue or are bothersome): -cough -decreased sexual function or desire -headache -nasal congestion or stuffiness -nausea or stomach pain -sore or cramping muscles This list may not describe all possible side effects. Call your doctor for medical advice about side effects. You may report side effects to FDA at 1-800-FDA-1088. Where should I keep my medicine? Keep out of the reach of children. Store at room temperature between 15 and 30 degrees C (59 and 86 degrees F). Protect from light. Keep container tightly closed. Throw away any unused medicine after the expiration date. NOTE: This sheet is a summary. It may not cover all possible information. If you have questions about this medicine, talk to your doctor, pharmacist, or health care provider.  2018 Elsevier/Gold Standard (2007-04-17 16:42:18)  Atorvastatin tablets What is this  medicine? ATORVASTATIN (a TORE va sta tin) is known as a HMG-CoA reductase inhibitor or 'statin'. It lowers the level of cholesterol and triglycerides in the blood. This drug may also reduce the risk of heart attack, stroke, or other health problems in patients with risk factors for heart disease. Diet and lifestyle changes are often used with this drug. This medicine may be used for other purposes; ask your health care provider or pharmacist if you have questions. COMMON BRAND NAME(S): Lipitor What should I tell my health care provider before I take this medicine? They need to know if you have any of these conditions: -frequently drink alcoholic beverages -history of stroke, TIA -kidney disease -liver disease -muscle aches or weakness -other medical condition -an unusual or allergic reaction to atorvastatin, other medicines, foods, dyes, or preservatives -pregnant or trying to get pregnant -breast-feeding How should I use this medicine? Take this medicine by mouth with a glass of water. Follow the directions on the prescription label. You can take this medicine with or without food. Take your doses at regular intervals. Do not take your medicine more often than directed. Talk to your pediatrician regarding the use of this medicine in children. While this drug may be prescribed for children as young as 67 years old for  selected conditions, precautions do apply. Overdosage: If you think you have taken too much of this medicine contact a poison control center or emergency room at once. NOTE: This medicine is only for you. Do not share this medicine with others. What if I miss a dose? If you miss a dose, take it as soon as you can. If it is almost time for your next dose, take only that dose. Do not take double or extra doses. What may interact with this medicine? Do not take this medicine with any of the following medications: -red yeast rice -telaprevir -telithromycin -voriconazole This  medicine may also interact with the following medications: -alcohol -antiviral medicines for HIV or AIDS -boceprevir -certain antibiotics like clarithromycin, erythromycin, troleandomycin -certain medicines for cholesterol like fenofibrate or gemfibrozil -cimetidine -clarithromycin -colchicine -cyclosporine -digoxin -female hormones, like estrogens or progestins and birth control pills -grapefruit juice -medicines for fungal infections like fluconazole, itraconazole, ketoconazole -niacin -rifampin -spironolactone This list may not describe all possible interactions. Give your health care provider a list of all the medicines, herbs, non-prescription drugs, or dietary supplements you use. Also tell them if you smoke, drink alcohol, or use illegal drugs. Some items may interact with your medicine. What should I watch for while using this medicine? Visit your doctor or health care professional for regular check-ups. You may need regular tests to make sure your liver is working properly. Tell your doctor or health care professional right away if you get any unexplained muscle pain, tenderness, or weakness, especially if you also have a fever and tiredness. Your doctor or health care professional may tell you to stop taking this medicine if you develop muscle problems. If your muscle problems do not go away after stopping this medicine, contact your health care professional. This drug is only part of a total heart-health program. Your doctor or a dietician can suggest a low-cholesterol and low-fat diet to help. Avoid alcohol and smoking, and keep a proper exercise schedule. Do not use this drug if you are pregnant or breast-feeding. Serious side effects to an unborn child or to an infant are possible. Talk to your doctor or pharmacist for more information. This medicine may affect blood sugar levels. If you have diabetes, check with your doctor or health care professional before you change your diet  or the dose of your diabetic medicine. If you are going to have surgery tell your health care professional that you are taking this drug. What side effects may I notice from receiving this medicine? Side effects that you should report to your doctor or health care professional as soon as possible: -allergic reactions like skin rash, itching or hives, swelling of the face, lips, or tongue -dark urine -fever -joint pain -muscle cramps, pain -redness, blistering, peeling or loosening of the skin, including inside the mouth -trouble passing urine or change in the amount of urine -unusually weak or tired -yellowing of eyes or skin Side effects that usually do not require medical attention (report to your doctor or health care professional if they continue or are bothersome): -constipation -heartburn -stomach gas, pain, upset This list may not describe all possible side effects. Call your doctor for medical advice about side effects. You may report side effects to FDA at 1-800-FDA-1088. Where should I keep my medicine? Keep out of the reach of children. Store at room temperature between 20 to 25 degrees C (68 to 77 degrees F). Throw away any unused medicine after the expiration date. NOTE: This sheet is a  summary. It may not cover all possible information. If you have questions about this medicine, talk to your doctor, pharmacist, or health care provider.  2018 Elsevier/Gold Standard (2010-12-25 44:61:90)

## 2016-07-17 NOTE — Assessment & Plan Note (Signed)
Has DM by A1c. Non-smoker. H/o genital herpes and graves dz controlled. Has anemia based on last CBC in 2014.  --Will obtain CBC w/ diff, CMP, HIV screen --Tdap booster given --Would consider thyroid evaluation next visit

## 2016-07-18 ENCOUNTER — Emergency Department (HOSPITAL_COMMUNITY): Payer: 59

## 2016-07-18 ENCOUNTER — Encounter (HOSPITAL_COMMUNITY): Payer: Self-pay

## 2016-07-18 ENCOUNTER — Ambulatory Visit (INDEPENDENT_AMBULATORY_CARE_PROVIDER_SITE_OTHER): Payer: 59 | Admitting: Pharmacist

## 2016-07-18 ENCOUNTER — Emergency Department (HOSPITAL_COMMUNITY)
Admission: EM | Admit: 2016-07-18 | Discharge: 2016-07-18 | Disposition: A | Discharge status: Home / Self Care | Payer: 59 | Attending: Emergency Medicine | Admitting: Emergency Medicine

## 2016-07-18 ENCOUNTER — Encounter: Payer: Self-pay | Admitting: Pharmacist

## 2016-07-18 DIAGNOSIS — E785 Hyperlipidemia, unspecified: Secondary | ICD-10-CM | POA: Diagnosis not present

## 2016-07-18 DIAGNOSIS — Y9301 Activity, walking, marching and hiking: Secondary | ICD-10-CM | POA: Diagnosis not present

## 2016-07-18 DIAGNOSIS — W1839XA Other fall on same level, initial encounter: Secondary | ICD-10-CM | POA: Diagnosis not present

## 2016-07-18 DIAGNOSIS — E119 Type 2 diabetes mellitus without complications: Secondary | ICD-10-CM | POA: Insufficient documentation

## 2016-07-18 DIAGNOSIS — Z7984 Long term (current) use of oral hypoglycemic drugs: Secondary | ICD-10-CM | POA: Insufficient documentation

## 2016-07-18 DIAGNOSIS — M25571 Pain in right ankle and joints of right foot: Secondary | ICD-10-CM | POA: Diagnosis not present

## 2016-07-18 DIAGNOSIS — I10 Essential (primary) hypertension: Secondary | ICD-10-CM | POA: Diagnosis not present

## 2016-07-18 DIAGNOSIS — Y92009 Unspecified place in unspecified non-institutional (private) residence as the place of occurrence of the external cause: Secondary | ICD-10-CM | POA: Diagnosis not present

## 2016-07-18 DIAGNOSIS — Y999 Unspecified external cause status: Secondary | ICD-10-CM | POA: Insufficient documentation

## 2016-07-18 DIAGNOSIS — E1169 Type 2 diabetes mellitus with other specified complication: Secondary | ICD-10-CM | POA: Diagnosis not present

## 2016-07-18 HISTORY — DX: Type 2 diabetes mellitus without complications: E11.9

## 2016-07-18 HISTORY — DX: Essential (primary) hypertension: I10

## 2016-07-18 IMAGING — CR DG ANKLE COMPLETE 3+V*R*
3 series · 3 of 3 positions shown · non-contrast
Comparison: None

CLINICAL DATA: RIGHT foot pain due to stepping in a hole at her
house, fall, lateral to anterior ankle pain post fall

EXAM:
RIGHT ANKLE - COMPLETE 3+ VIEW

[x ankle ap right]
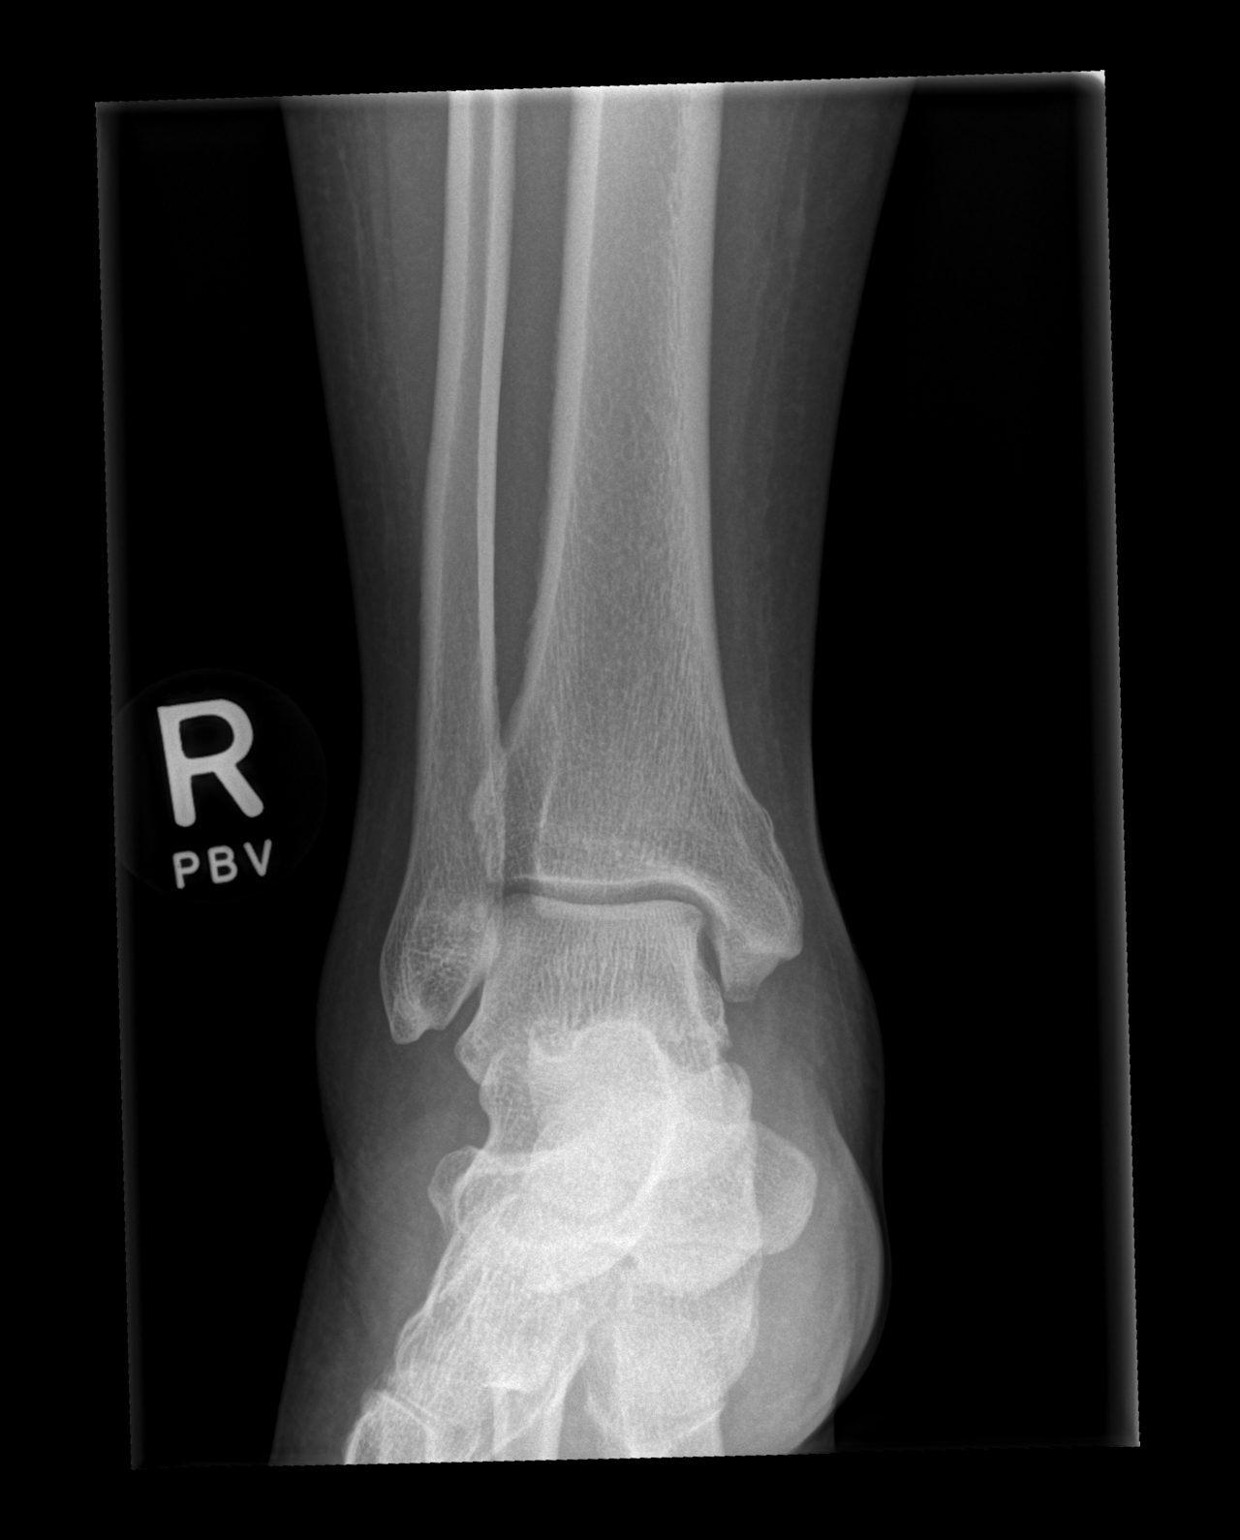

[x ankle obl right]
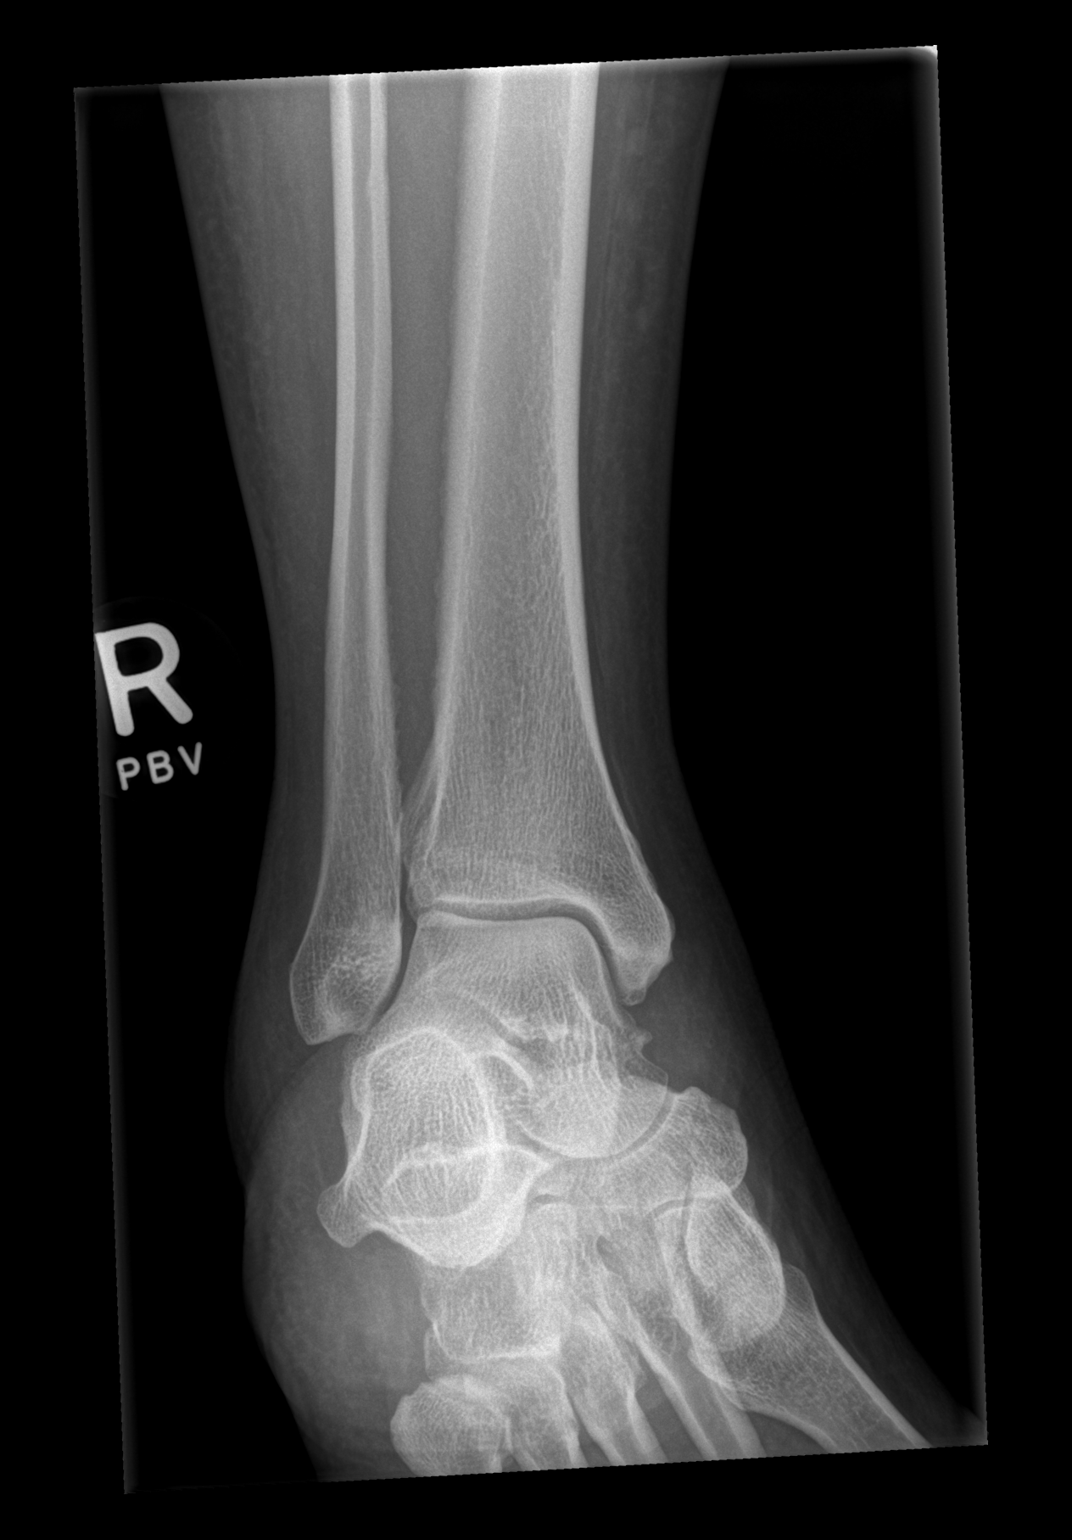

[x ankle lat right]
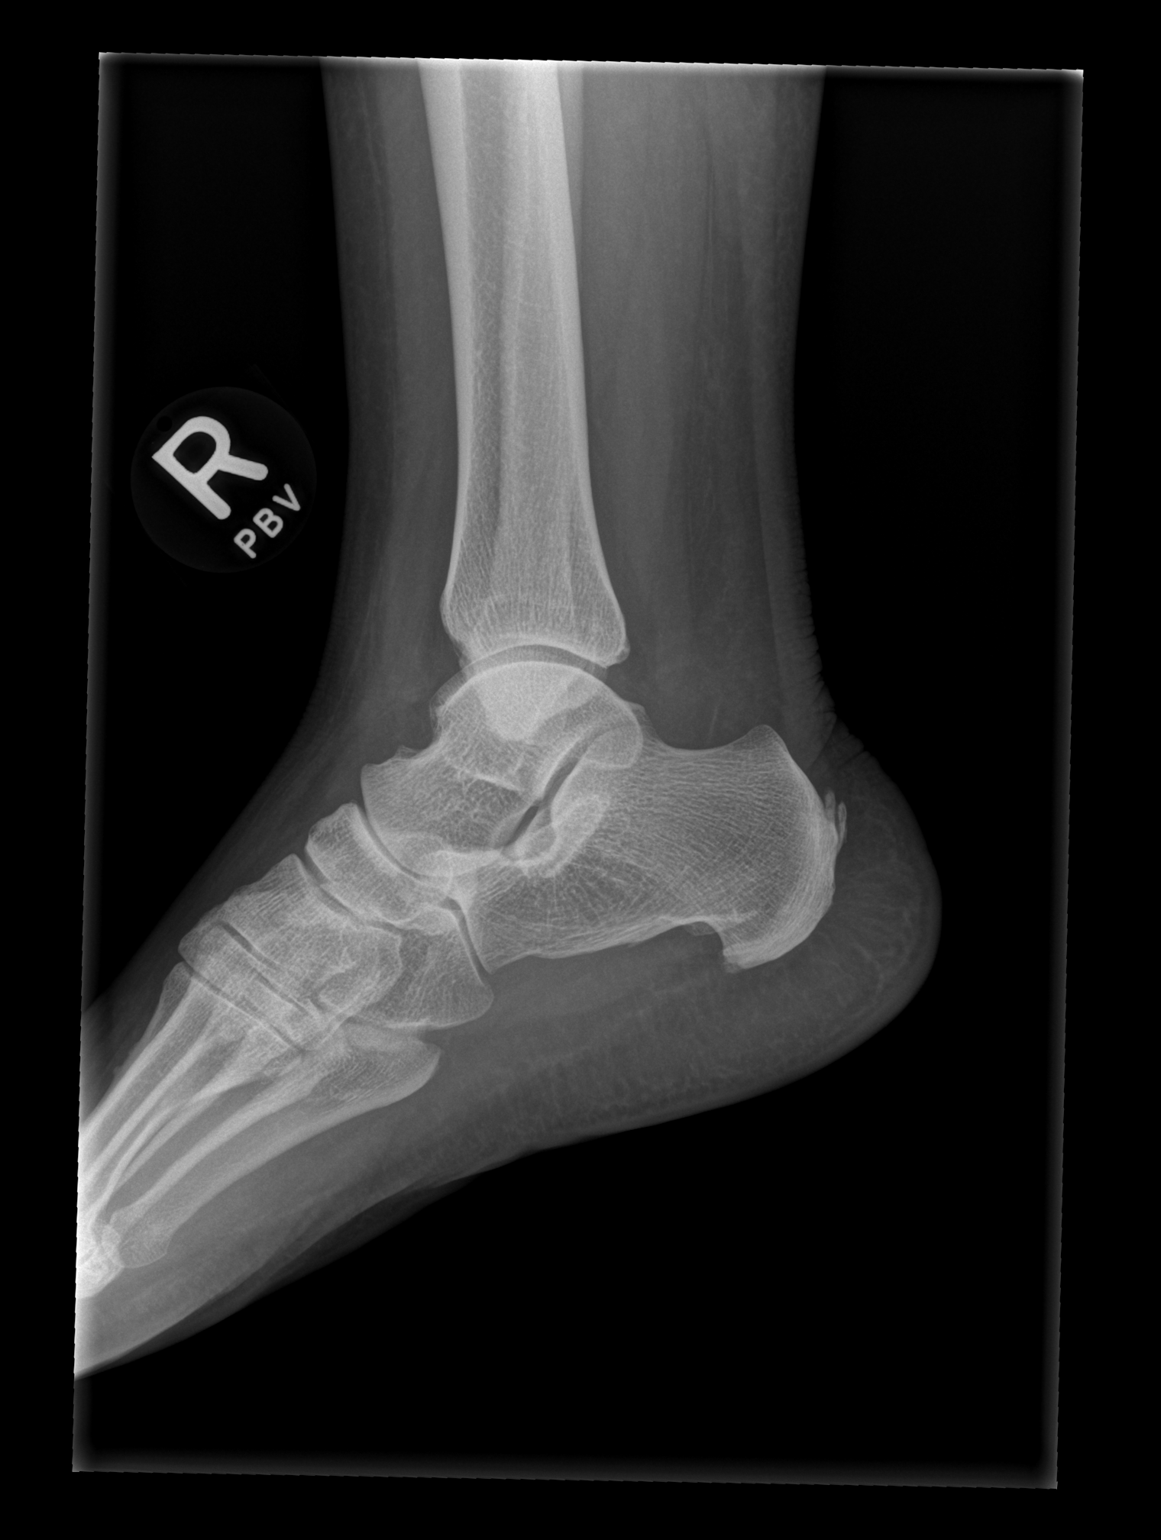

[3 of 3 positions shown; findings below may reference images not displayed]

FINDINGS: Osseous mineralization normal.

Scattered soft tissue swelling.

Mild lateral ankle joint space narrowing.

No acute fracture, dislocation, or bone destruction.
IMPRESSION: No acute osseous abnormalities.

Mild degenerative changes RIGHT ankle joint.

## 2016-07-18 MED ORDER — ONETOUCH VERIO FLEX SYSTEM W/DEVICE KIT: 1.0000 | PACK | Freq: Once | 0 refills | Status: AC

## 2016-07-18 MED ORDER — METFORMIN HCL ER 500 MG PO TB24: 500.0000 mg | ORAL_TABLET | Freq: Two times a day (BID) | ORAL | 3 refills | Status: DC

## 2016-07-18 MED ORDER — ONETOUCH DELICA LANCING DEV MISC: 3 refills | Status: DC

## 2016-07-18 MED ORDER — ONETOUCH DELICA LANCETS 33G MISC
3 refills | Status: DC
Start: 2016-07-18 — End: 2018-04-01

## 2016-07-18 MED ORDER — GLUCOSE BLOOD VI STRP: ORAL_STRIP | 12 refills | Status: DC

## 2016-07-18 MED ORDER — IBUPROFEN 800 MG PO TABS
800.0000 mg | ORAL_TABLET | Freq: Once | ORAL | Status: AC
  Administered 2016-07-18: 800 mg via ORAL
  Filled 2016-07-18: qty 1

## 2016-07-18 MED ORDER — ACETAMINOPHEN 325 MG PO TABS
650.0000 mg | ORAL_TABLET | Freq: Once | ORAL | Status: AC
  Administered 2016-07-18: 650 mg via ORAL
  Filled 2016-07-18: qty 2

## 2016-07-18 NOTE — ED Provider Notes (Signed)
[facility] DEPT Provider Note   CSN: [id number] Arrival date & time: 07/18/16  1656  By signing my name below, I, Collene Leyden, attest that this documentation has been prepared under the direction and in the presence of Carmon Sails, [place]. Electronically Signed: Collene Leyden, Scribe. 07/18/16. 7:57 PM.  History   Chief Complaint Chief Complaint  Patient presents with  . Fall  . Ankle Pain   HPI Comments: Valerie Bauer is a 46 y.o. female with a history of hypertension and DM, who presents to the Emergency Department complaining of sudden-onset, constant right ankle pain s/p fall earlier today. Patient states she was walking when she fell in a hole, twisting her right ankle inwards. No numbness or tingling. Patient reports associated swelling of the right ankle. No medications taken prior to arrival. Patient is ambulatory in the emergency department. Patient denies any fever, chills, nausea, vomiting, weakness, or any additional symptoms.   The history is provided by the patient. No language interpreter was used.    Past Medical History:  Diagnosis Date  . Diabetes mellitus without complication ([place])   . Hypertension     Patient Active Problem List   Diagnosis Date Noted  . Hyperlipidemia associated with type 2 diabetes mellitus ([place]) 07/18/2016  . Encounter for preventative adult health care exam with abnormal findings 07/17/2016  . Type 2 diabetes mellitus without complication ([place]) 16/60/6301  . Cervical high risk HPV (human papillomavirus) test positive 03/09/2014  . Genital herpes 12/26/2009  . Mass of multiple sites of left breast 11/14/2009  . Obesity (BMI 30-39.9) 12/21/2007  . Primary hypertension 12/21/2007  . Graves disease 04/17/2006    Past Surgical History:  Procedure Laterality Date  . BREAST BIOPSY Left 11/20/2009    OB History    No data available       Home Medications    Prior to Admission medications   Medication Sig Start Date End  Date Taking? Authorizing Provider  atorvastatin (LIPITOR) 40 MG tablet Take 1 tablet (40 mg total) by mouth daily. 07/17/16   [place] Bing, DO  Blood Glucose Monitoring Suppl ([place]) w/Device KIT 1 Device by Does not apply route once. 07/18/16 07/18/16  Zenia Resides, MD  glucose blood (ONETOUCH VERIO) test strip Use up to twice daily testing. E11.9 07/18/16   Zenia Resides, MD  Lancet Devices (ONE TOUCH DELICA LANCING DEV) MISC Use up to twice daily testing. E11.9 07/18/16   Zenia Resides, MD  losartan (COZAAR) 25 MG tablet Take 1 tablet (25 mg total) by mouth daily. 07/17/16   [place] Bing, DO  metFORMIN (GLUCOPHAGE-XR) 500 MG 24 hr tablet Take 1-2 tablets (500-1,000 mg total) by mouth 2 (two) times daily. Titrate as discussed in clinic up to 1056m twice daily 07/18/16   Hensel, WJamal Collin MD  [facility] 360FMISC Use up to twice daily testing. E11.9 07/18/16   HZenia Resides MD  valACYclovir (VALTREX) 1000 MG tablet Take 1 tablet (1,000 mg total) by mouth daily. Patient taking differently: Take 1,000 mg by mouth daily as needed.  04/28/15   KOlin Hauser DO    Family History Family History  Problem Relation Age of Onset  . Breast cancer Mother     Social History Social History  Substance Use Topics  . Smoking status: Never Smoker  . Smokeless tobacco: Never Used  . Alcohol use No     Allergies   Patient has no known allergies.  Review of Systems Review of Systems  Constitutional: Negative for chills and fever.  Gastrointestinal: Negative for diarrhea and vomiting.  Musculoskeletal: Positive for arthralgias (right ankle) and joint swelling (right ankle). Negative for gait problem.  Neurological: Negative for weakness and numbness.     Physical Exam Updated Vital Signs BP 134/80 (BP Location: Right Arm)   Pulse 97   Temp 98.5 F (36.9 C) (Oral)   Resp 20   Ht 5' (1.524 m)   Wt 84.4 kg (186 lb)   LMP  07/09/2016   SpO2 100%   BMI 36.33 kg/m   Physical Exam  Cardiovascular: Normal rate and regular rhythm.   <2 capillary refill in toes bilaterally  Pulmonary/Chest: Effort normal and breath sounds normal.  Musculoskeletal: Normal range of motion. She exhibits edema and tenderness. She exhibits no deformity.  Mild mid foot and lateral foot edema on the right side. Point tenderness to the posterior lateral malleoli at mid foot on the right.  Positive Talar tilt. No obvious  ecchymosis, erythema or skin abrasions. Full passive ROM of bilateral ankles without crepitus.  Patient able to bear weight in ED (4+ steps).   No bony tenderness over medial malleoli, navicular bone or 5th metatarsal base.   Achilles tendon is non tender. Negative anterior and posterior drawer. Negative syndesmosis squeeze test. Negative Thompson test.   Neurological:  Feet: sensation to light touch intact in the distribution of the saphenous nerve, medial plantar nerve, lateral plantar nerve, bilaterally.   Nursing note and vitals reviewed.    ED Treatments / Results  DIAGNOSTIC STUDIES: Oxygen Saturation is 100% on RA, normal by my interpretation.    COORDINATION OF CARE: 7:55 PM Discussed treatment plan with pt at bedside and pt agreed to plan, which includes ibuprofen and an ankle brace.    Labs (all labs ordered are listed, but only abnormal results are displayed) Labs Reviewed - No data to display  EKG  EKG Interpretation None       Radiology Dg Ankle Complete Right  Result Date: 07/18/2016 CLINICAL DATA:  RIGHT foot pain due to stepping in a hole at her house, fall, lateral to anterior ankle pain post fall EXAM: RIGHT ANKLE - COMPLETE 3+ VIEW COMPARISON:  None FINDINGS: Osseous mineralization normal. Scattered soft tissue swelling. Mild lateral ankle joint space narrowing. No acute fracture, dislocation, or bone destruction. IMPRESSION: No acute osseous abnormalities. Mild degenerative changes  RIGHT ankle joint. Electronically Signed   By: Lavonia Dana M.D.   On: 07/18/2016 17:51    Procedures Procedures (including critical care time)  Medications Ordered in ED Medications  acetaminophen (TYLENOL) tablet 650 mg (650 mg Oral Given 07/18/16 2012)  ibuprofen (ADVIL,MOTRIN) tablet 800 mg (800 mg Oral Given 07/18/16 2012)     Initial Impression / Assessment and Plan / ED Course  I have reviewed the triage vital signs and the nursing notes.  Pertinent labs & imaging results that were available during my care of the patient were reviewed by me and considered in my medical decision making (see chart for details).     Patient X-Ray negative for obvious fracture or dislocation. RLE is NVI. Pain managed in ED. Pt advised to follow up with orthopedics/PCP if symptoms persist for possibility of missed fracture diagnosis. Patient given brace while in ED, conservative therapy recommended and discussed. Patient will be dc home & is agreeable with above plan.   Final Clinical Impressions(s) / ED Diagnoses   Final diagnoses:  Acute right ankle pain  New Prescriptions New Prescriptions   No medications on file   I personally performed the services described in this documentation, which was scribed in my presence. The recorded information has been reviewed and is accurate.     Kinnie Feil, PA-C 07/18/16 2015    Varney Biles, MD 07/19/16 [phone]

## 2016-07-18 NOTE — Assessment & Plan Note (Signed)
Hypertension longstanding currently uncontrolled.  Patient has not started taking losartan yet as it was prescribed yesterday but plans to pick up script today.  Start losartan 25mg  daily today.

## 2016-07-18 NOTE — Discharge Instructions (Signed)
Your x-ray was negative today. Your pain is most likely from an over stretched ligament.   We will treat your pain with: 600 mg ibuprofen every 8 hours 1000 mg ibuprofen every 8 hours Ice, rest and elevate Do this for the next 5-7 days Wear your brace until you can spell the alphabet with minimal pain and taking steps is tolerable  Contact your primary care provider for worsening symptoms or prolonged pain

## 2016-07-18 NOTE — ED Triage Notes (Signed)
Patient reports that she was walking and stepped in a hole and twisted her right ankle. Patient has slight swelling noted.

## 2016-07-18 NOTE — Patient Instructions (Signed)
It was great to meet you today!  Begin taking metformin 500mg  once daily. After the first week begin increasing the dose as below: -Week 1: 500mg  once daily with food -Week 2: 500mg  twice daily with food -Week 3: 1000mg  in the morning and 500mg  in the evening with food -Week 4: 1000mg  twice daily with food  Begin taking losartan 25mg  once daily.  Please follow-up with pharmacy clinic in 2 weeks.

## 2016-07-18 NOTE — Progress Notes (Signed)
    S:     Chief Complaint  Patient presents with  . Medication Management    Patient arrives ambulating and in good spirits.  Presents for diabetes evaluation, education, and management at the request of Dr. Yisroel Ramming. Patient was referred on 07/17/16.  Patient was last seen by Primary Care Provider on 07/17/16. Pt was prescribed metformin yesterday but has not picked up the prescription yet.  Patient reports gestational diabetes was diagnosed in 1998 and then T2DM was diagnosed in March 2017.   Family/Social History: none  Patient denies adherence with medications as they were prescribed yesterday but she reports she will begin taking them today. Diabetes medications to be started include: metformin 500mg  XR daily Hypertension medications to be started include: Losartan 25mg  daily  Patient denies hypoglycemic events but does endorse recent fatigue.  Patient reported dietary habits: Eats 2-3 meals/day Breakfast: variable but often bacon, grits, eggs, soda (regular) Lunch: fast food (burger, chicken tenders, sub, soda) and occasional salad Dinner: often eating out, occasional salad Snacks: veggie straws, granola bars Drinks: water throughout the day with, 1 soda daily  Patient reported exercise habits: none   Patient reports nocturia about 2x per evening. Patient denies neuropathy. Patient denies visual changes. Patient denies self foot exams but was counseled to begin doing so.  O:  Physical Exam  Constitutional: She appears well-developed and well-nourished.    Review of Systems  Constitutional: Positive for malaise/fatigue.     Lab Results  Component Value Date   HGBA1C 10.0 07/17/2016   Vitals:   07/18/16 1024  BP: (!) 152/82     A/P: Diabetes newly diagnosed currently uncontrolled with an a1c of 10. Patient denies hypoglycemic events and is able to verbalize appropriate hypoglycemia management plan. Pt notes hesitancy with injections at this time and would  prefer to continue with the metformin titration and lifestyle changes alone for now. -Counseled pt extensively on diet habits, healthy eating, and exercise -Educated pt on using glucometer daily before breakfast -Continue metformin XR 500mg  daily x1 week then increase to 500mg  XR BID. Increase by 500mg  weekly up to 1000mg  XR BID. -At next visit assess titration of metformin and FBG readings - consider GLP (discussed Trulicity with her during visit) or SGLT2 inhibitor as appropriate.  Next A1C anticipated August 2018.    ASCVD risk greater than 7.5%. Continued atorvastatin 40 mg.   Hypertension longstanding currently uncontrolled.  Patient has not started taking losartan yet as it was prescribed yesterday but plans to pick up script today. -Start losartan 25mg  daily today.   Written patient instructions provided.  Total time in face to face counseling 40 minutes.   Follow up in Pharmacist Clinic Visit in 2 weeks.   Patient seen with Jerrye Noble, PharmD Candidate, and Arrie Senate, PharmD PGY-1 Resident and Bennye Alm, PharmD, BCPS, PGY2 Resident.

## 2016-07-18 NOTE — Progress Notes (Signed)
Patient ID: Valerie Bauer, female   DOB: 06/14/1970, 46 y.o.   MRN: [id number] Reviewed: Agree with Dr. Graylin Shiver documentation and management.

## 2016-07-18 NOTE — Assessment & Plan Note (Signed)
Diabetes newly diagnosed currently uncontrolled with an a1c of 10. Patient denies hypoglycemic events and is able to verbalize appropriate hypoglycemia management plan. Pt notes hesitancy with injections at this time and would prefer to continue with the metformin titration and lifestyle changes alone for now. -Counseled pt extensively on diet habits, healthy eating, and exercise -Educated pt on using glucometer daily before breakfast -Continue metformin XR 500mg  daily x1 week then increase to 500mg  XR BID. Increase by 500mg  weekly up to 1000mg  XR BID. -At next visit assess titration of metformin and FBG readings - consider GLP (discussed Trulicity with her during visit) or SGLT2 inhibitor as appropriate.

## 2016-07-18 NOTE — Assessment & Plan Note (Signed)
ASCVD risk greater than 7.5%. Continued atorvastatin 40 mg.

## 2016-07-19 ENCOUNTER — Other Ambulatory Visit: Payer: Self-pay | Admitting: Family Medicine

## 2016-07-19 ENCOUNTER — Telehealth: Payer: Self-pay | Admitting: Family Medicine

## 2016-07-19 DIAGNOSIS — D509 Iron deficiency anemia, unspecified: Secondary | ICD-10-CM | POA: Insufficient documentation

## 2016-07-19 LAB — CBC WITH DIFFERENTIAL/PLATELET
BASOS ABS: 0 10*3/uL (ref 0.0–0.2)
BASOS: 0 %
EOS (ABSOLUTE): 0.1 10*3/uL (ref 0.0–0.4)
Eos: 1 %
HEMATOCRIT: 27.7 % — AB (ref 34.0–46.6)
HEMOGLOBIN: 7.2 g/dL — AB (ref 11.1–15.9)
Immature Grans (Abs): 0 10*3/uL (ref 0.0–0.1)
Immature Granulocytes: 0 %
Lymphocytes Absolute: 3.5 10*3/uL — ABNORMAL HIGH (ref 0.7–3.1)
Lymphs: 38 %
MCH: 15.9 pg — ABNORMAL LOW (ref 26.6–33.0)
MCHC: 26 g/dL — AB (ref 31.5–35.7)
MCV: 61 fL — ABNORMAL LOW (ref 79–97)
MONOCYTES: 9 %
Monocytes Absolute: 0.8 10*3/uL (ref 0.1–0.9)
NEUTROS ABS: 4.8 10*3/uL (ref 1.4–7.0)
Neutrophils: 52 %
Platelets: 444 10*3/uL — ABNORMAL HIGH (ref 150–379)
RBC: 4.52 x10E6/uL (ref 3.77–5.28)
RDW: 18.2 % — ABNORMAL HIGH (ref 12.3–15.4)
WBC: 9.4 10*3/uL (ref 3.4–10.8)

## 2016-07-19 LAB — COMPREHENSIVE METABOLIC PANEL
ALBUMIN: 4.3 g/dL (ref 3.5–5.5)
ALK PHOS: 50 IU/L (ref 39–117)
ALT: 13 IU/L (ref 0–32)
AST: 19 IU/L (ref 0–40)
Albumin/Globulin Ratio: 1.6 (ref 1.2–2.2)
BUN / CREAT RATIO: 13 (ref 9–23)
BUN: 10 mg/dL (ref 6–24)
Bilirubin Total: <0.2 mg/dL (ref 0.0–1.2)
CALCIUM: 9 mg/dL (ref 8.7–10.2)
CO2: 20 mmol/L (ref 18–29)
CREATININE: 0.76 mg/dL (ref 0.57–1.00)
Chloride: 103 mmol/L (ref 96–106)
GFR, EST AFRICAN AMERICAN: 109 mL/min/{1.73_m2} (ref >59)
GFR, EST NON AFRICAN AMERICAN: 94 mL/min/{1.73_m2} (ref >59)
GLOBULIN, TOTAL: 2.7 g/dL (ref 1.5–4.5)
Glucose: 200 mg/dL — ABNORMAL HIGH (ref 65–99)
Potassium: 4.3 mmol/L (ref 3.5–5.2)
SODIUM: 137 mmol/L (ref 134–144)
TOTAL PROTEIN: 7 g/dL (ref 6.0–8.5)

## 2016-07-19 LAB — HIV ANTIBODY (ROUTINE TESTING W REFLEX): HIV Screen 4th Generation wRfx: NONREACTIVE

## 2016-07-19 MED ORDER — FERROUS SULFATE 325 (65 FE) MG PO TBEC: 325.0000 mg | DELAYED_RELEASE_TABLET | Freq: Three times a day (TID) | ORAL | 3 refills | Status: DC

## 2016-07-19 NOTE — Telephone Encounter (Signed)
Called patient regarding hemoglobin 7.2 at office visit 5/30. Transfusion threshold <7.0. Previous hemoglobin 8.5 ~4 years ago. Appears to be microcytic. Patient is actively menstruating. Denies symptoms of lightheadedness, chest pain, dyspnea, syncope or fatigue. Suspect this is secondary to IDA. Patient to come in for ferritin lab draw on 6/4. Iron tablets sent to pharmacy. Patient understands with no further questions. Next appointment on 6/14 with Dr. Valentina Lucks. -- [name], [place], PGY-1

## 2016-08-01 ENCOUNTER — Encounter: Payer: Self-pay | Admitting: Pharmacist

## 2016-08-01 ENCOUNTER — Ambulatory Visit (INDEPENDENT_AMBULATORY_CARE_PROVIDER_SITE_OTHER): Payer: 59 | Admitting: Pharmacist

## 2016-08-01 DIAGNOSIS — A6000 Herpesviral infection of urogenital system, unspecified: Secondary | ICD-10-CM

## 2016-08-01 DIAGNOSIS — I10 Essential (primary) hypertension: Secondary | ICD-10-CM

## 2016-08-01 DIAGNOSIS — D509 Iron deficiency anemia, unspecified: Secondary | ICD-10-CM | POA: Diagnosis not present

## 2016-08-01 DIAGNOSIS — E0500 Thyrotoxicosis with diffuse goiter without thyrotoxic crisis or storm: Secondary | ICD-10-CM

## 2016-08-01 DIAGNOSIS — E119 Type 2 diabetes mellitus without complications: Secondary | ICD-10-CM

## 2016-08-01 MED ORDER — VALACYCLOVIR HCL 1 G PO TABS: 1000.0000 mg | ORAL_TABLET | Freq: Every day | ORAL | 2 refills | Status: DC

## 2016-08-01 MED ORDER — DULAGLUTIDE 0.75 MG/0.5ML ~~LOC~~ SOAJ: 0.7500 mg | SUBCUTANEOUS | 1 refills | Status: DC

## 2016-08-01 MED ORDER — LOSARTAN POTASSIUM 50 MG PO TABS: 50.0000 mg | ORAL_TABLET | Freq: Every day | ORAL | 3 refills | Status: DC

## 2016-08-01 NOTE — Progress Notes (Signed)
S:     Chief Complaint  Patient presents with  . Medication Management    diabetes   Patient arrives in a good mood and ambulating without assistance. Patient became a little overwhelmed and discouraged during the visit due to uncontrolled status of her disease states despite diet changes. Presents for diabetes evaluation, education, and management as a follow up from last visit on 07/18/16. Patient was last seen by Primary Care Provider on 07/17/16.  Patient was recently discharged from ED on 07/18/16 and all medications have been reviewed.  Per note on 07/18/16, Patient reports gestational Diabetes was diagnosed in 1998, and then T2DM diagnosed in March 2017.   Patient reports adherence with medications. Pt reports taking ferrous sulfate once daily. Current diabetes medications include: metformin 500mg  BID. Patient was told to self-titrate to metformin 1000mg  BID at last visit, but has not done so at this visit Current hypertension medications include: losartan 25mg  once daily  Patient denies hypoglycemic events.  Patient reported dietary habits: states that she has been making significant eating changes, such as eating more salads and salmon and switching from ranch dressing to low-sugar dressings  Patient reported exercise habits: states that she works from home and sits all day   O:  Physical Exam  Constitutional: She appears well-developed and well-nourished.   Review of Systems  All other systems reviewed and are negative.   Lab Results  Component Value Date   HGBA1C 10.0 07/17/2016   Vitals:   08/01/16 1015  BP: (!) 142/78  Pulse: 76   Home fasting CBG: 180 - 250   2 hour post-prandial/random CBG: None.  A/P: Diabetes recently diagnosed currently uncontrolled with A1C 10% (5/30). Patient denies hypoglycemic events and is able to verbalize appropriate hypoglycemia management plan. Patient reports adherence with medication. Control is suboptimal due to suboptimal  medications/dosing. Pt states that she has been making significant diet changes. States that she usually checks her BG every morning. States that she has been tolerating metformin 500mg  BID well with food, but has not self-titrated up to 1000mg  BID. Plan: increase metformin dose from 500mg  BID to 500mg  qAM and 1000mg  qPM tonight. Start dulaglutide (Trulicity) injection 0.75mg  once weekly in 3-4 days. Counseled about proper injection techniques and disposal and to administer injection at the same time every week. Counseled about additive GI side effects (nausea) with metformin and dulaglutide and that pt can go back to metformin 500mg  BID if needed. Counseled to check BG at least twice a day. Next A1C anticipated in 09/2016.   Hypertension longstanding currently uncontrolled with elevated BP 142/78 today.  Patient reports adherence with medication. Control is suboptimal due to suboptimal dosing. Plan: increase losartan dose from 25mg  once daily to 50mg  once daily. Continue to monitor BP. If BP remains uncontrolled at next visit, consider carvedilol 3.125mg  due to less metabolic effects than other beta blockers.  Iron-deficiency anemia: currently controlled today as pt states she is no longer eating 2 large cups of ice every day. Pt denies constipation and is tolerating ferrous sulfate once daily well. Plan: increase ferrous sulfate 325mg  from once daily to 325mg  twice daily. Counseled about constipation and that pt can go back to ferrous 325mg  once daily if needed.  Graves' Disease: currently controlled today as pt denies symptoms of hyperthyroidism. Pt states she received radioactive iodine in the past. Last TSH 2.207 (04/2012). Were not able to get a TSH today. Plan: Next TSH anticipated at next visit.  Herpes: currently controlled today as  pt denies symptoms. Pt reports taking valacyclovir as needed, but ran out of refills. Plan: refill valacyclovir to [facility] (all other medications should go to  [facility]).  Written patient instructions provided.  Total time in face to face counseling 35 minutes.   Follow up in Pharmacist Clinic Visit in 4 weeks.   Patient seen with Shelle Iron, PharmD Candidate

## 2016-08-01 NOTE — Patient Instructions (Addendum)
Thanks for coming to see [place] today! Here are the medication changes we discussed today:  For your diabetes, we would like you to: - Take your metformin 500mg  1 tablet in the day and 2 tablets at night (total 1500mg ) if tolerated. If you develop upset stomach with the 2 tablets, you can go back to 1 tablet in the day and 1 tablet at night.  - Start Trulicity injection once weekly on Sunday or Monday. Make sure you are injecting yourself at the same time every week. - Please check your blood sugar at least once a day and bring your log to your next clinic visit.  For your high blood pressure, we would like you to: - Take your losartan 25mg  2 tablets (total 50mg ) once a day. We will check your blood pressure at your next visit and reassess how you are doing.   For your iron supplements, we would like you to: - Take your ferrous sulfate 1 tablet twice a day.  We will be refilling your Valtrex to [facility] today.  Please continue with your eating changes and continue to exercise more and keep your heart rate up throughout the day despite sitting down all day. Please make an appointment with [place] in 1 month so we can assess how you are doing.

## 2016-08-02 NOTE — Assessment & Plan Note (Signed)
Graves' Disease: currently controlled today as pt denies symptoms of hyperthyroidism. Pt states she received radioactive iodine in the past. Last TSH 2.207 (04/2012). Were not able to get a TSH today. Plan: Next TSH anticipated at next visit.

## 2016-08-02 NOTE — Assessment & Plan Note (Signed)
Diabetes recently diagnosed currently uncontrolled with A1C 10% (5/30). Patient denies hypoglycemic events and is able to verbalize appropriate hypoglycemia management plan. Patient reports adherence with medication. Control is suboptimal due to suboptimal medications/dosing. Pt states that she has been making significant diet changes. States that she usually checks her BG every morning. States that she has been tolerating metformin 500mg  BID well with food, but has not self-titrated up to 1000mg  BID. Plan: increase metformin dose from 500mg  BID to 500mg  qAM and 1000mg  qPM tonight. Start dulaglutide (Trulicity) injection 0.75mg  once weekly in 3-4 days. Counseled about proper injection techniques and disposal and to administer injection at the same time every week. Counseled about additive GI side effects (nausea) with metformin and dulaglutide and that pt can go back to metformin 500mg  BID if needed. Counseled to check BG at least twice a day. Next A1C anticipated in 09/2016.

## 2016-08-02 NOTE — Assessment & Plan Note (Signed)
Hypertension longstanding currently uncontrolled with elevated BP 142/78 today.  Patient reports adherence with medication. Control is suboptimal due to suboptimal dosing. Plan: increase losartan dose from 25mg  once daily to 50mg  once daily. Continue to monitor BP. If BP remains uncontrolled at next visit, consider carvedilol 3.125mg  due to less metabolic effects than other beta blockers.

## 2016-08-02 NOTE — Assessment & Plan Note (Signed)
Iron-deficiency anemia: currently controlled today as pt states she is no longer eating 2 large cups of ice every day. Pt denies constipation and is tolerating ferrous sulfate once daily well. Plan: increase ferrous sulfate 325mg  from once daily to 325mg  twice daily. Counseled about constipation and that pt can go back to ferrous 325mg  once daily if needed.

## 2016-08-05 NOTE — Progress Notes (Signed)
Patient ID: Valerie Bauer, female   DOB: 05/07/1970, 46 y.o.   MRN: [id number]  Reviewed: Agree with Dr. Graylin Shiver documentation and management.

## 2016-08-29 ENCOUNTER — Ambulatory Visit: Payer: 59 | Admitting: Pharmacist

## 2016-12-14 ENCOUNTER — Encounter (HOSPITAL_COMMUNITY): Payer: Self-pay | Admitting: Emergency Medicine

## 2016-12-14 ENCOUNTER — Emergency Department (HOSPITAL_COMMUNITY)
Admission: EM | Admit: 2016-12-14 | Discharge: 2016-12-14 | Disposition: A | Discharge status: Home / Self Care | Payer: Self-pay | Attending: Emergency Medicine | Admitting: Emergency Medicine

## 2016-12-14 ENCOUNTER — Emergency Department (HOSPITAL_COMMUNITY): Payer: Self-pay

## 2016-12-14 DIAGNOSIS — E119 Type 2 diabetes mellitus without complications: Secondary | ICD-10-CM | POA: Insufficient documentation

## 2016-12-14 DIAGNOSIS — I10 Essential (primary) hypertension: Secondary | ICD-10-CM | POA: Insufficient documentation

## 2016-12-14 DIAGNOSIS — Z7984 Long term (current) use of oral hypoglycemic drugs: Secondary | ICD-10-CM | POA: Insufficient documentation

## 2016-12-14 DIAGNOSIS — D219 Benign neoplasm of connective and other soft tissue, unspecified: Secondary | ICD-10-CM | POA: Insufficient documentation

## 2016-12-14 LAB — CBC WITH DIFFERENTIAL/PLATELET
BASOS ABS: 0 10*3/uL (ref 0.0–0.1)
BASOS PCT: 1 %
EOS ABS: 0.1 10*3/uL (ref 0.0–0.7)
Eosinophils Relative: 2 %
HEMATOCRIT: 27.8 % — AB (ref 36.0–46.0)
HEMOGLOBIN: 8.4 g/dL — AB (ref 12.0–15.0)
Lymphocytes Relative: 43 %
Lymphs Abs: 2.6 10*3/uL (ref 0.7–4.0)
MCH: 22.8 pg — ABNORMAL LOW (ref 26.0–34.0)
MCHC: 30.2 g/dL (ref 30.0–36.0)
MCV: 75.5 fL — ABNORMAL LOW (ref 78.0–100.0)
MONOS PCT: 10 %
Monocytes Absolute: 0.6 10*3/uL (ref 0.1–1.0)
NEUTROS PCT: 44 %
Neutro Abs: 2.7 10*3/uL (ref 1.7–7.7)
PLATELETS: 388 10*3/uL (ref 150–400)
RBC: 3.68 MIL/uL — ABNORMAL LOW (ref 3.87–5.11)
RDW: 15.9 % — ABNORMAL HIGH (ref 11.5–15.5)
WBC: 6 10*3/uL (ref 4.0–10.5)

## 2016-12-14 LAB — BASIC METABOLIC PANEL
ANION GAP: 9 (ref 5–15)
BUN: 11 mg/dL (ref 6–20)
CHLORIDE: 102 mmol/L (ref 101–111)
CO2: 23 mmol/L (ref 22–32)
Calcium: 8.2 mg/dL — ABNORMAL LOW (ref 8.9–10.3)
Creatinine, Ser: 0.56 mg/dL (ref 0.44–1.00)
GFR calc Af Amer: >60 mL/min (ref >60)
GLUCOSE: 172 mg/dL — AB (ref 65–99)
POTASSIUM: 3.6 mmol/L (ref 3.5–5.1)
Sodium: 134 mmol/L — ABNORMAL LOW (ref 135–145)

## 2016-12-14 LAB — POC URINE PREG, ED: Preg Test, Ur: NEGATIVE

## 2016-12-14 IMAGING — CT CT ABD-PELV W/ CM
2 of 5 series · 16 of 46 positions shown, 18 images · IV contrast (ISOVUE)
Comparison: None.

CLINICAL DATA: Abdominal distention.

EXAM:
CT ABDOMEN AND PELVIS WITH CONTRAST
TECHNIQUE: Multidetector CT imaging of the abdomen and pelvis was performed
using the standard protocol following bolus administration of
intravenous contrast.
CONTRAST:  100mL [59] IOPAMIDOL ([59]) INJECTION 61%

[Series 2: abd/pel with · axial · 0.80mm/px · z∈[-421,-76]mm · 13 of 81 slices shown, 15 images]
[im 6/81  soft-tissue]
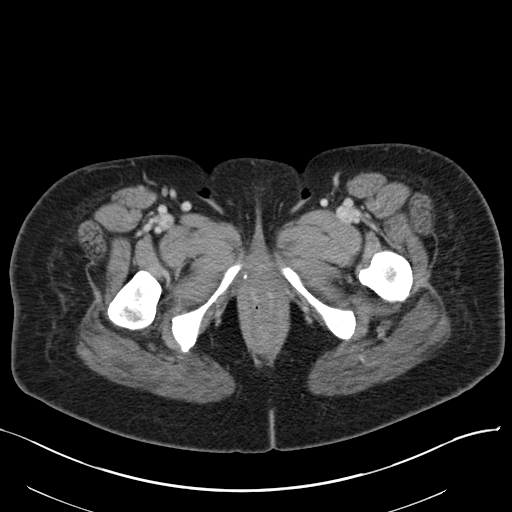
[im 6/81  bone]
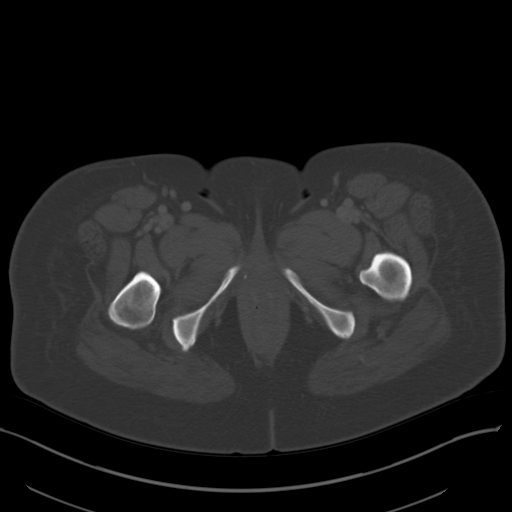
[im 11/81  soft-tissue]
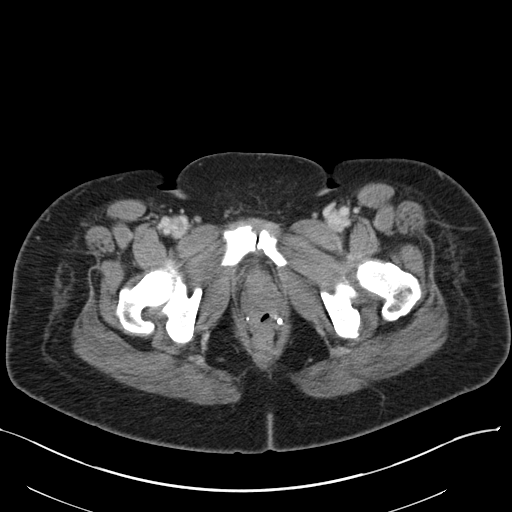
[im 17/81  soft-tissue]
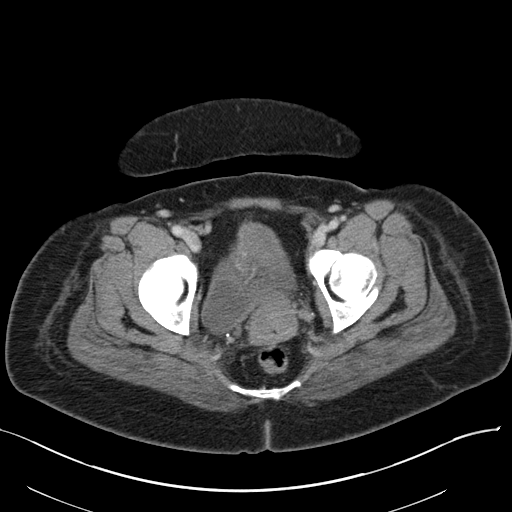
[im 22/81  soft-tissue]
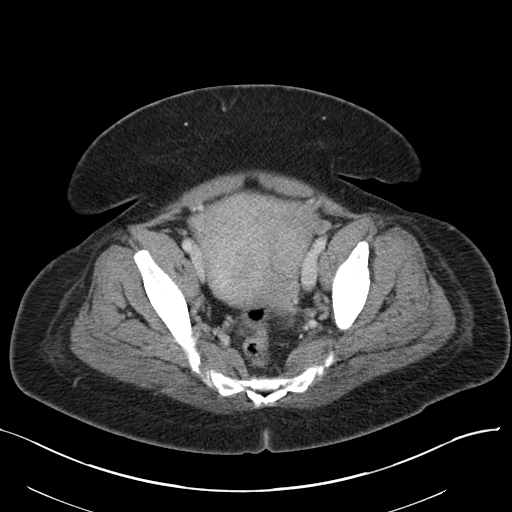
[im 27/81  soft-tissue]
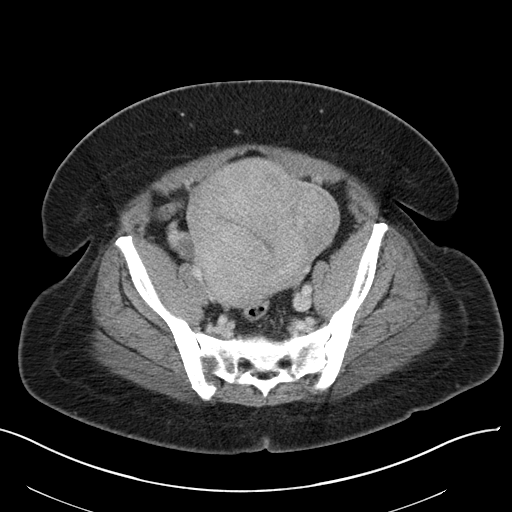
[im 33/81  soft-tissue]
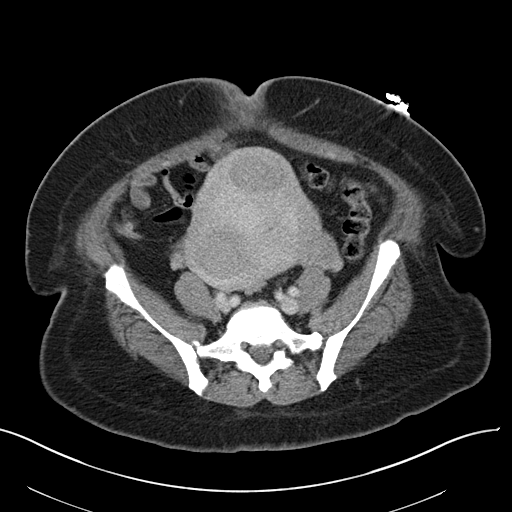
[im 43/81  soft-tissue]
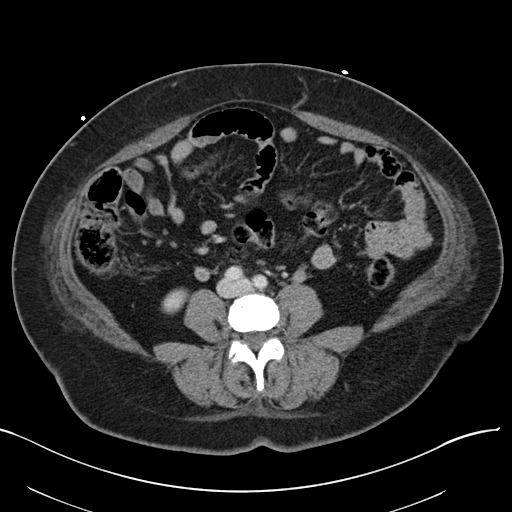
[im 49/81  soft-tissue]
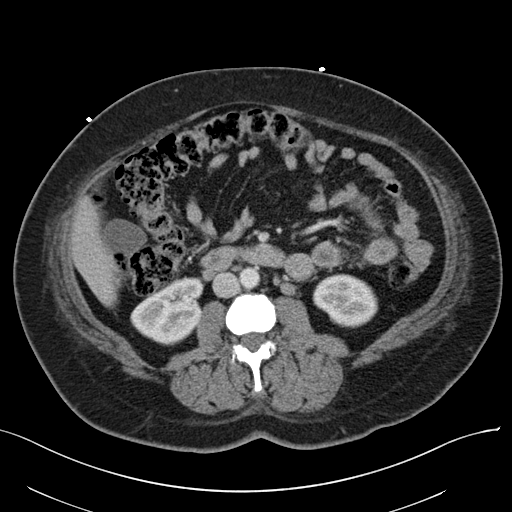
[im 54/81  soft-tissue]
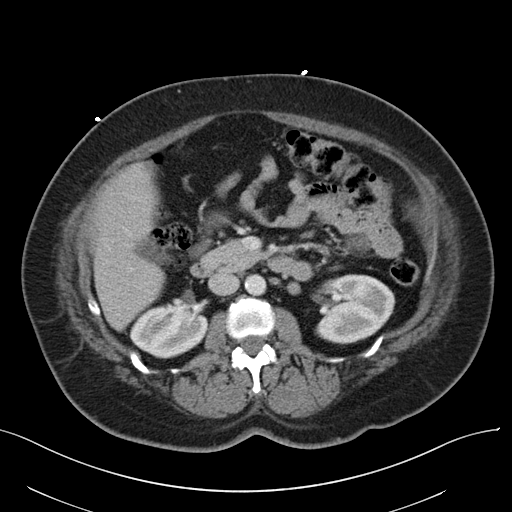
[im 54/81  bone]
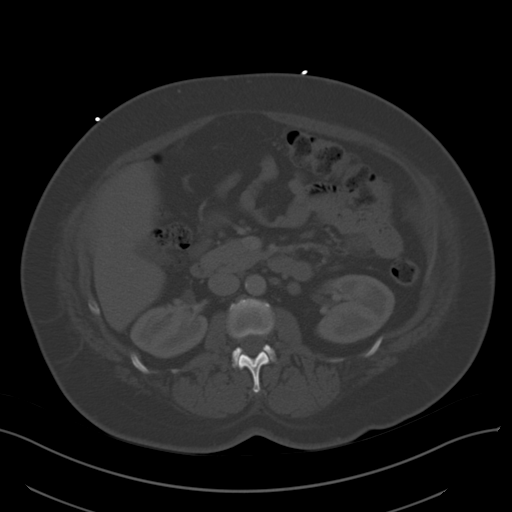
[im 59/81  soft-tissue]
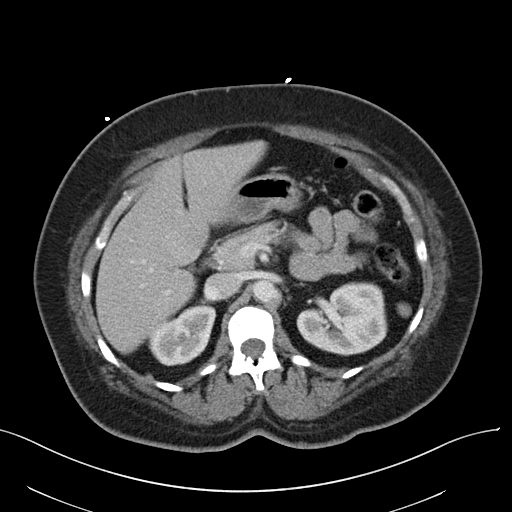
[im 65/81  soft-tissue]
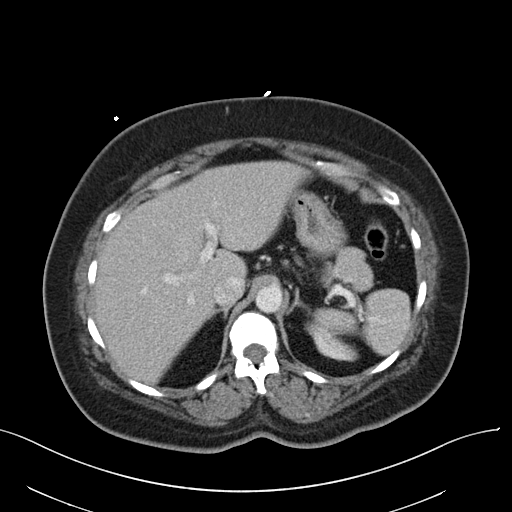
[im 70/81  soft-tissue]
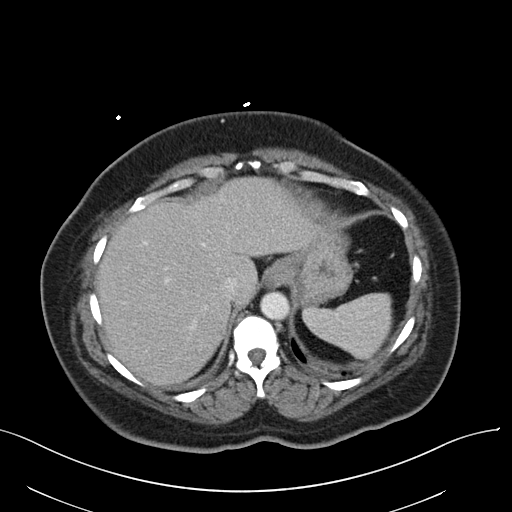
[im 75/81  soft-tissue]
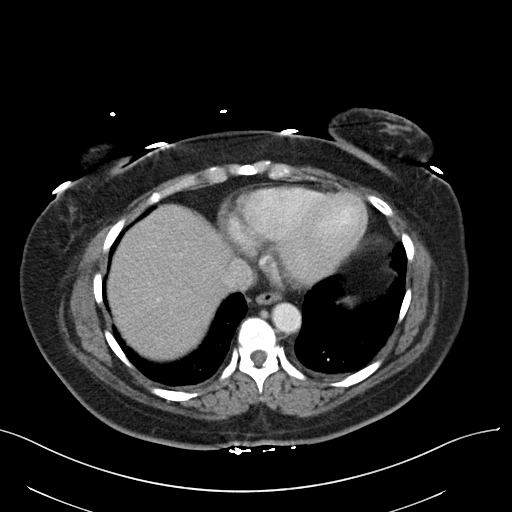

[Series 5: coronal a/|p · coronal · 0.77mm/px · 3 of 155 slices shown]
[im 52/155  soft-tissue]
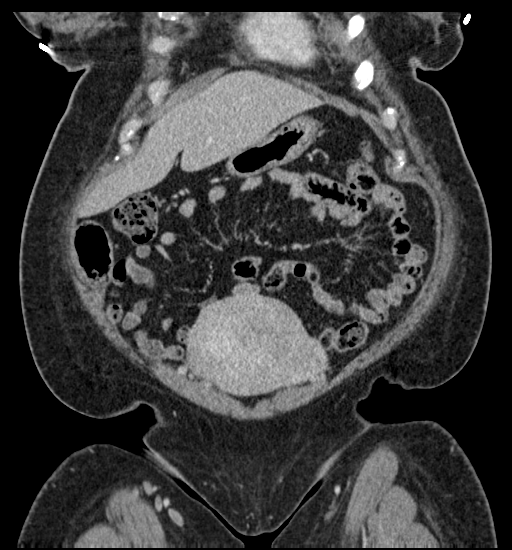
[im 69/155  soft-tissue]
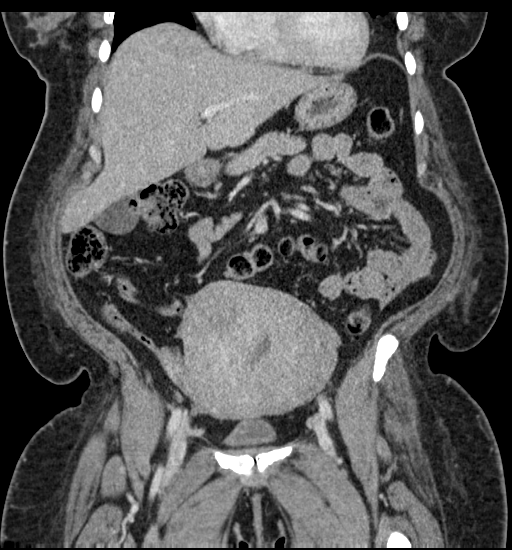
[im 86/155  soft-tissue]
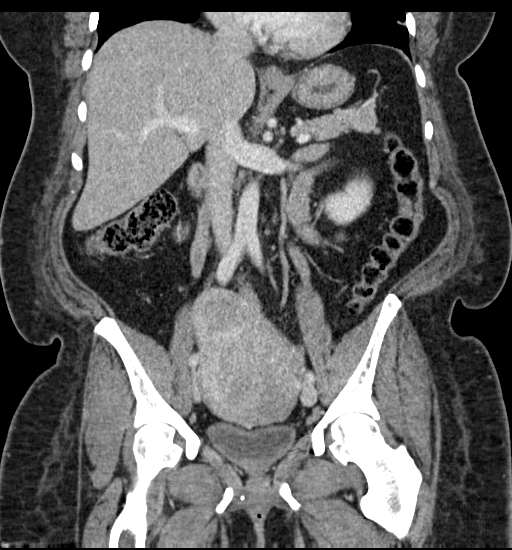

[16 of 46 positions shown; findings below may reference images not displayed]

FINDINGS: Lower chest: No pulmonary nodules, pleural effusions, or
infiltrates. Heart size is normal. No imaged pericardial effusion or
significant coronary artery calcifications.

Hepatobiliary: There is a small cyst within the posterior segment of
the right hepatic lobe. Otherwise the liver is normal in appearance.
Gallbladder is present.

Pancreas: Unremarkable. No pancreatic ductal dilatation or
surrounding inflammatory changes.

Spleen: Normal in size without focal abnormality.

Adrenals/Urinary Tract: The adrenal glands are normal in appearance.
No hydronephrosis or suspicious renal mass. Urinary bladder is
decompressed and normal in appearance.

Stomach/Bowel: The stomach is normal in appearance. The appendix is
well seen and has a normal appearance. There is moderate stool
burden. There are scattered colonic diverticula. No acute
diverticulitis.

Vascular/Lymphatic: Minimal atherosclerotic calcification of the
abdominal aorta not associated with aneurysm. No retroperitoneal or
mesenteric adenopathy. There is normal vascular opacification of the
celiac axis, superior mesenteric artery, and inferior mesenteric
artery. Normal appearance of the portal venous system and inferior
vena cava.

Reproductive: Uterus is enlarged by multiple masses compatible with
fibroids. Uterus measures 18.7 x 9.0 x 12.3 cm. The fundus of the
uterus is at the level of the umbilicus. No adnexal mass.

Other: Postoperative changes and probable fat necrosis are
identified at the umbilicus.

Musculoskeletal: No acute or significant osseous findings.
IMPRESSION: 1. Enlarged uterus containing numerous fibroids likely accounting
for the patient's abdominal distention.
2. Colonic diverticulosis.
3. No urinary tract obstruction.
4. Normal appendix.
5. Benign right hepatic cyst.
6.  Aortic atherosclerosis.  ([59]-[59])

## 2016-12-14 MED ORDER — IOPAMIDOL (ISOVUE-300) INJECTION 61%
100.0000 mL | Freq: Once | INTRAVENOUS | Status: AC | PRN
  Administered 2016-12-14: 100 mL via INTRAVENOUS

## 2016-12-14 MED ORDER — IOPAMIDOL (ISOVUE-300) INJECTION 61%
INTRAVENOUS | Status: AC
  Filled 2016-12-14: qty 100

## 2016-12-14 MED ORDER — SODIUM CHLORIDE 0.9 % IV SOLN
INTRAVENOUS | Status: DC
  Administered 2016-12-14: 20 mL/h via INTRAVENOUS

## 2016-12-14 MED ORDER — TRAMADOL HCL 50 MG PO TABS: 50.0000 mg | ORAL_TABLET | Freq: Four times a day (QID) | ORAL | 0 refills | Status: DC | PRN

## 2016-12-14 NOTE — ED Triage Notes (Signed)
Per pt, states umbilical hernia going on 2 months-states pain getting worse-has not seen PCP regarding symptoms

## 2016-12-14 NOTE — ED Provider Notes (Signed)
[facility] DEPT Provider Note   CSN: [id number] Arrival date & time: 12/14/16  0747     History   Chief Complaint Chief Complaint  Patient presents with  . Umbilical Hernia    HPI Valerie Bauer is a 46 y.o. female.  46 year old female presents with several day history of.  Umbilical pain.  Denies any fever, vomiting.  Has had swelling without change in bowel habits.  Does have a known history of umbilical hernia but has never been treated for this.  Denies any large bulge to her abdomen.  Symptoms persistent nothing makes them better.  No treatment used prior to arrival      Past Medical History:  Diagnosis Date  . Diabetes mellitus without complication ([place])   . Hypertension     Patient Active Problem List   Diagnosis Date Noted  . Iron deficiency anemia 07/19/2016  . Hyperlipidemia associated with type 2 diabetes mellitus ([place]) 07/18/2016  . Encounter for preventative adult health care exam with abnormal findings 07/17/2016  . Type 2 diabetes mellitus without complication ([place]) 40/98/1191  . Cervical high risk HPV (human papillomavirus) test positive 03/09/2014  . Genital herpes 12/26/2009  . Mass of multiple sites of left breast 11/14/2009  . Obesity (BMI 30-39.9) 12/21/2007  . Primary hypertension 12/21/2007  . Graves disease 04/17/2006    Past Surgical History:  Procedure Laterality Date  . BREAST BIOPSY Left 11/20/2009    OB History    No data available       Home Medications    Prior to Admission medications   Medication Sig Start Date End Date Taking? Authorizing Provider  atorvastatin (LIPITOR) 40 MG tablet Take 1 tablet (40 mg total) by mouth daily. 07/17/16   [place] Bing, DO  Dulaglutide (TRULICITY) 4.78 GN/5.6OZ SOPN Inject 0.75 mg into the skin once a week. 08/01/16   Zenia Resides, MD  ferrous sulfate 325 (65 FE) MG EC tablet Take 1 tablet (325 mg total) by mouth 3 (three) times daily with meals.  07/19/16    Bing, DO  glucose blood (ONETOUCH VERIO) test strip Use up to twice daily testing. E11.9 07/18/16   Zenia Resides, MD  Lancet Devices (ONE TOUCH DELICA LANCING DEV) MISC Use up to twice daily testing. E11.9 07/18/16   Zenia Resides, MD  losartan (COZAAR) 50 MG tablet Take 1 tablet (50 mg total) by mouth daily. 08/01/16   Zenia Resides, MD  metFORMIN (GLUCOPHAGE-XR) 500 MG 24 hr tablet Take 1-2 tablets (500-1,000 mg total) by mouth 2 (two) times daily. Titrate as discussed in clinic up to 1000mg  twice daily 07/18/16   Hensel, Jamal Collin, MD  [facility] DELICA LANCETS 30Q MISC Use up to twice daily testing. E11.9 07/18/16   Zenia Resides, MD  valACYclovir (VALTREX) 1000 MG tablet Take 1 tablet (1,000 mg total) by mouth daily. 08/01/16   Zenia Resides, MD    Family History Family History  Problem Relation Age of Onset  . Breast cancer Mother     Social History Social History  Substance Use Topics  . Smoking status: Never Smoker  . Smokeless tobacco: Never Used  . Alcohol use No     Allergies   Patient has no known allergies.   Review of Systems Review of Systems  All other systems reviewed and are negative.    Physical Exam Updated Vital Signs BP (!) 173/89 (BP Location: Left Arm)   Pulse 70   Temp  98.4 F (36.9 C) (Oral)   Resp 17   SpO2 100%   Physical Exam  Constitutional: She is oriented to person, place, and time. She appears well-developed and well-nourished.  Non-toxic appearance. No distress.  HENT:  Head: Normocephalic and atraumatic.  Eyes: Pupils are equal, round, and reactive to light. Conjunctivae, EOM and lids are normal.  Neck: Normal range of motion. Neck supple. No tracheal deviation present. No thyroid mass present.  Cardiovascular: Normal rate, regular rhythm and normal heart sounds.  Exam reveals no gallop.   No murmur heard. Pulmonary/Chest: Effort normal and breath sounds normal. No stridor. No respiratory  distress. She has no decreased breath sounds. She has no wheezes. She has no rhonchi. She has no rales.  Abdominal: Soft. Normal appearance and bowel sounds are normal. She exhibits no distension. There is tenderness in the periumbilical area. There is no rebound and no CVA tenderness.    Musculoskeletal: Normal range of motion. She exhibits no edema or tenderness.  Neurological: She is alert and oriented to person, place, and time. She has normal strength. No cranial nerve deficit or sensory deficit. GCS eye subscore is 4. GCS verbal subscore is 5. GCS motor subscore is 6.  Skin: Skin is warm and dry. No abrasion and no rash noted.  Psychiatric: She has a normal mood and affect. Her speech is normal and behavior is normal.  Nursing note and vitals reviewed.    ED Treatments / Results  Labs (all labs ordered are listed, but only abnormal results are displayed) Labs Reviewed  CBC WITH DIFFERENTIAL/PLATELET  BASIC METABOLIC PANEL    EKG  EKG Interpretation None       Radiology No results found.  Procedures Procedures (including critical care time)  Medications Ordered in ED Medications  0.9 %  sodium chloride infusion (not administered)     Initial Impression / Assessment and Plan / ED Course  I have reviewed the triage vital signs and the nursing notes.  Pertinent labs & imaging results that were available during my care of the patient were reviewed by me and considered in my medical decision making (see chart for details).    Patient's abdominal CT consistent with uterine fibroids and no signs of incarceration.  Prescribe short course of opiates and patient will follow-up with her gynecologist   Final Clinical Impressions(s) / ED Diagnoses   Final diagnoses:  None    New Prescriptions New Prescriptions   No medications on file     Lacretia Leigh, MD 12/14/16 1150

## 2016-12-16 ENCOUNTER — Ambulatory Visit (INDEPENDENT_AMBULATORY_CARE_PROVIDER_SITE_OTHER): Payer: Self-pay | Admitting: Family Medicine

## 2016-12-16 ENCOUNTER — Encounter: Payer: Self-pay | Admitting: Family Medicine

## 2016-12-16 VITALS — BP 128/82 | HR 82 | Temp 97.9°F | Ht 61.0 in | Wt 187.6 lb

## 2016-12-16 DIAGNOSIS — D259 Leiomyoma of uterus, unspecified: Secondary | ICD-10-CM | POA: Insufficient documentation

## 2016-12-16 MED ORDER — IBUPROFEN 600 MG PO TABS: 600.0000 mg | ORAL_TABLET | Freq: Three times a day (TID) | ORAL | 0 refills | Status: DC | PRN

## 2016-12-16 NOTE — Progress Notes (Signed)
Subjective:    Patient ID: Valerie Bauer , female   DOB: 12-Apr-1970 , 46 y.o..   MRN: [id number]  HPI  Valerie Bauer is here for   1. ED Follow up for umbilical pain: Patient is here today for a follow-up visit after going to the emergency department on October 27 for umbilical pain. She had a abdominal CT scan which showed multiple fibroids. She had a negative pregnancy test at that time and a low hemoglobin of 8.6. Patient states that for the last couple years she has very heavy menses that last for 4-5 days. She has menses twice monthly. During her menses she needs 10 pads a day. Notes that over the last year she thinks her periods have gotten heavier. For the last few months she has felt soreness near her umbilical area that is intermittent in nature. She has felt a "knot" under her belly button. Her last menstrual period was October 20. Denies any dizziness, lightheadedness, nausea, vomiting, diarrhea, constipation, dysuriafever.  Review of Systems: Per HPI.   Past Medical History: Patient Active Problem List   Diagnosis Date Noted  . Uterine fibroid 12/16/2016  . Iron deficiency anemia 07/19/2016  . Hyperlipidemia associated with type 2 diabetes mellitus ([place]) 07/18/2016  . Encounter for preventative adult health care exam with abnormal findings 07/17/2016  . Type 2 diabetes mellitus without complication ([place]) 40/97/3532  . Cervical high risk HPV (human papillomavirus) test positive 03/09/2014  . Genital herpes 12/26/2009  . Mass of multiple sites of left breast 11/14/2009  . Obesity (BMI 30-39.9) 12/21/2007  . Primary hypertension 12/21/2007  . Graves disease 04/17/2006    Medications: reviewed and updated Current Outpatient Prescriptions  Medication Sig Dispense Refill  . atorvastatin (LIPITOR) 40 MG tablet Take 1 tablet (40 mg total) by mouth daily. 90 tablet 3  . Dulaglutide (TRULICITY) 9.92 EQ/6.8TM SOPN Inject 0.75 mg into the skin once a week. (Patient not taking:  Reported on 12/14/2016) 4 pen 1  . ferrous sulfate 325 (65 FE) MG EC tablet Take 1 tablet (325 mg total) by mouth 3 (three) times daily with meals. (Patient taking differently: Take 325 mg by mouth daily. ) 90 tablet 3  . glucose blood (ONETOUCH VERIO) test strip Use up to twice daily testing. E11.9 100 each 12  . ibuprofen (ADVIL,MOTRIN) 600 MG tablet Take 1 tablet (600 mg total) by mouth every 8 (eight) hours as needed. 90 tablet 0  . Lancet Devices (ONE TOUCH DELICA LANCING DEV) MISC Use up to twice daily testing. E11.9 100 each 3  . losartan (COZAAR) 50 MG tablet Take 1 tablet (50 mg total) by mouth daily. 90 tablet 3  . metFORMIN (GLUCOPHAGE-XR) 500 MG 24 hr tablet Take 1-2 tablets (500-1,000 mg total) by mouth 2 (two) times daily. Titrate as discussed in clinic up to 1000mg  twice daily (Patient taking differently: Take 500 mg by mouth 2 (two) times daily. ) 120 tablet 3  . ONETOUCH DELICA LANCETS 19Q MISC Use up to twice daily testing. E11.9 100 each 3  . traMADol (ULTRAM) 50 MG tablet Take 1 tablet (50 mg total) by mouth every 6 (six) hours as needed. 15 tablet 0  . valACYclovir (VALTREX) 1000 MG tablet Take 1 tablet (1,000 mg total) by mouth daily. (Patient taking differently: Take 1,000 mg by mouth daily as needed (outbreak). ) 30 tablet 2   No current facility-administered medications for this visit.     Social Hx:  reports that she has never  smoked. She has never used smokeless tobacco.   Objective:   BP 128/82   Pulse 82   Temp 97.9 F (36.6 C) (Oral)   Ht 5\' 1"  (1.549 m)   Wt 187 lb 9.6 oz (85.1 kg)   LMP 12/07/2016 (Exact Date)   SpO2 99%   BMI 35.45 kg/m  Physical Exam  Gen: NAD, alert, cooperative with exam, well-appearing Gastrointestinal: soft, distended, normal bowel sounds, tender to palpation on umbilicus but no hernia felt. She does have a hard fibrous mass at the 9 oclock position of her umbilicus that is tender to touch. Uterus palpated at umbilicus.  Skin: no  rashes, normal turgor  Neurological: no gross deficits.  Psych: good insight, normal mood and affect  Assessment & Plan:  Uterine fibroid Associated with menorrhagia and anemia. Multiple uterine fibroids on abdominal CT this week. Uterus is large and measures 18.7 x 9.0 x 12.3 cm. Uterus is palpable on abdominal exam as well. She has a tender area on her umbilicus likely from a fibroid. Additionally has very heavy twice monthly menses, last hemoglobin 8.6. Urine pregnancy test negative in the ED. - Discussed daily iron supplementation - Ibuprofen 600 mg 3 times a day when necessary during menses - Referral to gynecology placed - Patient is not insured currently but states that she is getting insurance next month when she starts her new job which showed help a for her gynecology visit  Orders Placed This Encounter  Procedures  . Ambulatory referral to Gynecology    Referral Priority:   Routine    Referral Type:   Consultation    Referral Reason:   Specialty Services Required    Requested Specialty:   Gynecology    Number of Visits Requested:   1   Meds ordered this encounter  Medications  . ibuprofen (ADVIL,MOTRIN) 600 MG tablet    Sig: Take 1 tablet (600 mg total) by mouth every 8 (eight) hours as needed.    Dispense:  90 tablet    Refill:  0    Smitty Cords, MD [place], PGY-3

## 2016-12-16 NOTE — Patient Instructions (Signed)
Thank you for coming in today, it was so nice to see you! Today we talked about:    Uterine fibroids: the fibroids of your uterus are likely causing your abdominal pain and heavy menstrual periods. I have placed a referral for the gynecologist for you, someone should call you within the next week to schedule an appointment. If you do not hear from anyone in the next week please call the clinic and let me know.   You can take ibuprofen 600 mg every 8 hours as needed while you are on your periods. If the 600 mg is not helping you can go up to 800 mg if needed.  Continue taking iron to keep your hemoglobin up and prevent anemia    If you have any questions or concerns, please do not hesitate to call the office at [phone]. You can also message me directly via MyChart.   Sincerely,  Smitty Cords, MD

## 2016-12-16 NOTE — Assessment & Plan Note (Addendum)
Associated with menorrhagia and anemia. Multiple uterine fibroids on abdominal CT this week. Uterus is large and measures 18.7 x 9.0 x 12.3 cm. Uterus is palpable on abdominal exam as well. She has a tender area on her umbilicus likely from a fibroid. Additionally has very heavy twice monthly menses, last hemoglobin 8.6. Urine pregnancy test negative in the ED. - Discussed daily iron supplementation - Ibuprofen 600 mg 3 times a day when necessary during menses - Referral to gynecology placed - Patient is not insured currently but states that she is getting insurance next month when she starts her new job which showed help a for her gynecology visit

## 2017-01-20 ENCOUNTER — Encounter: Payer: Self-pay | Admitting: Family Medicine

## 2017-08-13 ENCOUNTER — Ambulatory Visit: Payer: 59 | Admitting: Family Medicine

## 2017-08-13 VITALS — BP 134/82 | HR 83 | Temp 98.6°F | Ht 61.0 in

## 2017-08-13 DIAGNOSIS — E785 Hyperlipidemia, unspecified: Secondary | ICD-10-CM | POA: Diagnosis not present

## 2017-08-13 DIAGNOSIS — E119 Type 2 diabetes mellitus without complications: Secondary | ICD-10-CM

## 2017-08-13 DIAGNOSIS — R5383 Other fatigue: Secondary | ICD-10-CM | POA: Diagnosis not present

## 2017-08-13 DIAGNOSIS — E1169 Type 2 diabetes mellitus with other specified complication: Secondary | ICD-10-CM | POA: Diagnosis not present

## 2017-08-13 LAB — POCT URINALYSIS DIP (MANUAL ENTRY)
Bilirubin, UA: NEGATIVE
GLUCOSE UA: NEGATIVE mg/dL
Ketones, POC UA: NEGATIVE mg/dL
LEUKOCYTES UA: NEGATIVE
NITRITE UA: NEGATIVE
Protein Ur, POC: NEGATIVE mg/dL
Spec Grav, UA: 1.02 (ref 1.010–1.025)
UROBILINOGEN UA: 0.2 U/dL
pH, UA: 6.5 (ref 5.0–8.0)

## 2017-08-13 LAB — POCT GLYCOSYLATED HEMOGLOBIN (HGB A1C): HBA1C, POC (CONTROLLED DIABETIC RANGE): 9.1 % — AB (ref 0.0–7.0)

## 2017-08-13 LAB — POCT UA - MICROSCOPIC ONLY

## 2017-08-13 LAB — POCT UA - MICROALBUMIN
Albumin/Creatinine Ratio, Urine, POC: <30
CREATININE, POC: 100 mg/dL
Microalbumin Ur, POC: 80 mg/L

## 2017-08-13 LAB — GLUCOSE, POCT (MANUAL RESULT ENTRY): POC Glucose: 147 mg/dl — AB (ref 70–99)

## 2017-08-13 NOTE — Patient Instructions (Signed)
It was great meeting you today! I am sorry that you have been having so many problems with fatigue recently. There are a number of things that could be causing this. I want to get blood counts, a thyroid level, a blood sugar check, and check your urine for sugar. These should be back tomorrow. I will call you with the results, and we can discuss the gameplan at that time. I would strongly advise taking your medications as prescribed in the meantime.

## 2017-08-14 ENCOUNTER — Telehealth: Payer: Self-pay | Admitting: *Deleted

## 2017-08-14 LAB — CBC WITH DIFFERENTIAL/PLATELET
BASOS ABS: 0 10*3/uL (ref 0.0–0.2)
BASOS: 1 %
EOS (ABSOLUTE): 0.1 10*3/uL (ref 0.0–0.4)
Eos: 2 %
HEMOGLOBIN: 9.2 g/dL — AB (ref 11.1–15.9)
Hematocrit: 32.4 % — ABNORMAL LOW (ref 34.0–46.6)
IMMATURE GRANS (ABS): 0 10*3/uL (ref 0.0–0.1)
Immature Granulocytes: 0 %
LYMPHS ABS: 2.6 10*3/uL (ref 0.7–3.1)
Lymphs: 41 %
MCH: 19.2 pg — AB (ref 26.6–33.0)
MCHC: 28.4 g/dL — AB (ref 31.5–35.7)
MCV: 68 fL — AB (ref 79–97)
MONOS ABS: 0.7 10*3/uL (ref 0.1–0.9)
Monocytes: 11 %
NEUTROS ABS: 2.9 10*3/uL (ref 1.4–7.0)
Neutrophils: 45 %
Platelets: 470 10*3/uL — ABNORMAL HIGH (ref 150–450)
RBC: 4.79 x10E6/uL (ref 3.77–5.28)
RDW: 19.8 % — ABNORMAL HIGH (ref 12.3–15.4)
WBC: 6.4 10*3/uL (ref 3.4–10.8)

## 2017-08-14 LAB — TSH: TSH: 8.84 u[IU]/mL — AB (ref 0.450–4.500)

## 2017-08-14 MED ORDER — LEVOTHYROXINE SODIUM 50 MCG PO TABS: 50.0000 ug | ORAL_TABLET | Freq: Every day | ORAL | 3 refills | Status: DC

## 2017-08-14 NOTE — Telephone Encounter (Signed)
Discussed lab results with patient. Informed her that her hgb was 9.2, which while low was actually a little higher than 8 months ago.  TSH of ~8 reviewed with patient. Explained that this is often a marker of low thyroid hormone. Patient with previous graves disease over a decade ago which was treated with radioactive iodine which would explain the low thyroid hormone.  Will start on synthroid 34mcg and recheck in 4-6 weeks. Encouraged patient to follow up with pcp for diabetes management as her urine microalbumin and a1c likely warrant better control. MCV in 60s, so could likely benefit from iron panel and iron supplementation as well. Full clinic note to follow. All questions answered, patient appreciative of call.  Guadalupe Dawn MD PGY-1 Family Medicine Resident

## 2017-08-14 NOTE — Telephone Encounter (Signed)
Would like to know the results from her lab work yesterday. Fleeger, Salome Spotted, CMA

## 2017-08-20 NOTE — Assessment & Plan Note (Addendum)
Patient with h/o graves disease from which she had an ablation 15-20 years ago. Now with elevated TSH. Giver her symptoms I will start her on low dose synthroid as she may now be in hypothyroid phase of treatment consider her past ablation. - synthroid 70mcg - recheck tsh with pcp in 4-6 weeks

## 2017-08-20 NOTE — Progress Notes (Signed)
   HPI 47 year old female who presents with 3 day history of fatigue. States that she has just had a large lack of energy over this period. She is feeling how she did when she was anemic. She has had no hot and cold intolerance. Has still had some irregular bleeding. No shortness of breath with exertion.  CC: Fatigue   ROS:  Review of Systems See HPI for ROS.   CC, SH/smoking status, and VS noted  Objective: BP 134/82   Pulse 83   Temp 98.6 F (37 C) (Oral)   Ht 5\' 1"  (1.549 m)   LMP 07/25/2017   SpO2 97%   BMI 35.45 kg/m  Gen: NAD, alert, cooperative, and pleasant. Well appearing AA female, comfortable in chair HEENT: NCAT, EOMI, PERRL CV: RRR, no murmur Resp: CTAB, no wheezes, non-labored Abd: SNTND, BS present, no guarding or organomegaly Ext: No edema, warm Neuro: Alert and oriented, Speech clear, No gross deficits   Assessment and plan:  Fatigue Patient with h/o graves disease from which she had an ablation 15-20 years ago. Now with elevated TSH. Giver her symptoms I will start her on low dose synthroid as she may now be in hypothyroid phase of treatment consider her past ablation. - synthroid 6mcg - recheck tsh with pcp in 4-6 weeks  Hyperlipidemia associated with type 2 diabetes mellitus (HCC) a1C 9.1. Will need to follow up with pcp for further management especially in setting of urine microalbuminuria.    Orders Placed This Encounter  Procedures  . CBC with Differential  . TSH  . HgB A1c  . Glucose (CBG)  . POCT UA - Microalbumin  . POCT urinalysis dipstick  . POCT UA - Microscopic Only    Meds ordered this encounter  Medications  . levothyroxine (SYNTHROID, LEVOTHROID) 50 MCG tablet    Sig: Take 1 tablet (50 mcg total) by mouth daily.    Dispense:  90 tablet    Refill:  3     Guadalupe Dawn MD PGY-1 Family Medicine Resident  08/20/2017 8:10 AM

## 2017-08-20 NOTE — Assessment & Plan Note (Signed)
a1C 9.1. Will need to follow up with pcp for further management especially in setting of urine microalbuminuria.

## 2018-04-01 ENCOUNTER — Other Ambulatory Visit: Payer: Self-pay | Admitting: Family Medicine

## 2018-04-01 ENCOUNTER — Encounter: Payer: Self-pay | Admitting: Family Medicine

## 2018-04-01 ENCOUNTER — Ambulatory Visit: Payer: 59 | Admitting: Family Medicine

## 2018-04-01 VITALS — BP 165/100 | HR 91 | Temp 98.1°F | Wt 188.4 lb

## 2018-04-01 DIAGNOSIS — IMO0001 Reserved for inherently not codable concepts without codable children: Secondary | ICD-10-CM

## 2018-04-01 DIAGNOSIS — I10 Essential (primary) hypertension: Secondary | ICD-10-CM

## 2018-04-01 DIAGNOSIS — E1165 Type 2 diabetes mellitus with hyperglycemia: Secondary | ICD-10-CM

## 2018-04-01 DIAGNOSIS — E039 Hypothyroidism, unspecified: Secondary | ICD-10-CM | POA: Diagnosis not present

## 2018-04-01 DIAGNOSIS — Z1231 Encounter for screening mammogram for malignant neoplasm of breast: Secondary | ICD-10-CM

## 2018-04-01 DIAGNOSIS — Z23 Encounter for immunization: Secondary | ICD-10-CM

## 2018-04-01 DIAGNOSIS — D259 Leiomyoma of uterus, unspecified: Secondary | ICD-10-CM

## 2018-04-01 LAB — POCT GLYCOSYLATED HEMOGLOBIN (HGB A1C): Hemoglobin A1C: 8.9 % — AB (ref 4.0–5.6)

## 2018-04-01 MED ORDER — DULAGLUTIDE 0.75 MG/0.5ML ~~LOC~~ SOAJ: 0.7500 mg | SUBCUTANEOUS | 1 refills | Status: DC

## 2018-04-01 MED ORDER — ONETOUCH DELICA LANCETS 33G MISC: 3 refills | Status: DC

## 2018-04-01 MED ORDER — LEVOTHYROXINE SODIUM 125 MCG PO TABS: 125.0000 ug | ORAL_TABLET | Freq: Every day | ORAL | 3 refills | Status: DC

## 2018-04-01 MED ORDER — METFORMIN HCL ER 500 MG PO TB24: 1000.0000 mg | ORAL_TABLET | Freq: Two times a day (BID) | ORAL | 3 refills | Status: DC

## 2018-04-01 MED ORDER — GLUCOSE BLOOD VI STRP: ORAL_STRIP | 12 refills | Status: DC

## 2018-04-01 MED ORDER — LOSARTAN POTASSIUM 50 MG PO TABS: 50.0000 mg | ORAL_TABLET | Freq: Every day | ORAL | 3 refills | Status: DC

## 2018-04-01 MED ORDER — ATORVASTATIN CALCIUM 40 MG PO TABS: 40.0000 mg | ORAL_TABLET | Freq: Every day | ORAL | 3 refills | Status: DC

## 2018-04-01 NOTE — Assessment & Plan Note (Signed)
Multiple fibroids based on CT.  Known heavy AUB without symptomatic anemia. - Ambulatory referral to OB/GYN

## 2018-04-01 NOTE — Assessment & Plan Note (Signed)
Uncontrolled based on elevated TSH 7 months ago.  Does have symptoms of fatigue.  No known CAD. - Given prescription for Synthroid 125 mcg daily - Plan to check TSH at follow-up in 1 month

## 2018-04-01 NOTE — Patient Instructions (Signed)
Thank you for coming in to see [place] today. Please see below to review our plan for today's visit.  1.  I cannot emphasize the importance of you staying on your medications.  I refilled all of them.  Be sure to take them as prescribed. 2.  We will recheck blood work in 1 month when I see you again.  Please do not miss this appointment. 3.  Occasions are helpful for controlling diabetes, however your diet is much more important.  Avoiding excessive carbohydrates including pastas, breads, potatoes, rice and sugary beverages such as sweet tea, juice, and sodas will help control your A1c level. 4.  I placed a referral for you to see an OB/GYN to discuss your fibroids.  Continue taking your iron tablet every other day and gradually increase to daily followed by twice a day every 2 weeks.  You can take MiraLAX in addition to this over-the-counter to help soften your stools.  Please call the clinic at [phone] if your symptoms worsen or you have any concerns. It was our pleasure to serve you.  [name], [employer], PGY-3

## 2018-04-01 NOTE — Assessment & Plan Note (Signed)
Poorly controlled due to nonadherence for at least 2 months.  Also on high intensity statin given diabetes. - Given refill for Lipitor 40 mg daily - Plan to check a panel at next follow-up following adherence

## 2018-04-01 NOTE — Assessment & Plan Note (Signed)
Uncontrolled due to nonadherence to medications.  A1c 8.9.  No history of insulin use or hypoglycemia. - Discussed at length the importance of lifestyle modifications including diet and exercise along with medication adherence - Given refill for metformin XR 1000 mg twice daily and Trulicity 75 mg daily along with diabetic supplies - RTC 1 month at which point we will check basic labs including CBC, CMET, microalbumin due to nonadherence

## 2018-04-01 NOTE — Progress Notes (Signed)
Subjective   Patient ID: Valerie Bauer    DOB: 1971-02-11, 48 y.o. female   MRN: [id number]  CC: "I have not been taking my medications"  HPI: Valerie Bauer is a 48 y.o. female who presents to clinic today for the following:  Diabetes: Patient with poorly controlled diabetes nonadherent to her metformin and Trulicity.  She is here today for routine diabetic follow-up.  She has no known retinopathy or nephropathy.  She is not seeing her eye doctor but plans to schedule an appointment later this month.  She understands she needs to start taking her medications both attributes her increased stress to not taking these.  She does not watch her diet and does not exercise.  Hypertension: Patient has been off her blood pressure medications for the last 2 months.  She denies chest pain, shortness of breath, lower extremity edema, or change in vision.  Hypothyroidism: History of Graves' disease over 10 years ago with radioactive iodine intervention.  She was recently seen back in June and found to have an elevated TSH and was prescribed levothyroxine 50 mcg daily, however patient has not taken medication.  She does report some fatigue but otherwise has no change to her weight or heat/cold intolerance.    Fibroids: Patient has long history of heavy AUB.  Periods occur every 6 days with bleeding for of the 6 days.  She usually uses 5 pads per day.  She was told she had multiple fibroids based on abdominal pelvic CT back in October 2018 for vague periumbilical discomfort and frequent menstrual bleeding.  She has never been seen by an OB/GYN doctor mainly because she did not have insurance last year.  She has sure insurance today.  She denies syncope, abdominal pain, nausea or vomiting, constipation.  She was taking iron tablets but has been off them for the last 2 months due to history of constipation.  ROS: see HPI for pertinent.  [place]: HTN, Graves disease, genital herpes, obesity. Surgical history  breast biopsy. Family history cancer (mother, breast). Smoking status reviewed. Medications reviewed.  Objective   BP (!) 165/100   Pulse 91   Temp 98.1 F (36.7 C) (Oral)   Wt 188 lb 6.4 oz (85.5 kg)   SpO2 100%   BMI 35.60 kg/m  Vitals and nursing note reviewed.  General: well nourished, well developed, NAD with non-toxic appearance HEENT: normocephalic, atraumatic, moist mucous membranes Neck: supple, non-tender without lymphadenopathy Cardiovascular: regular rate and rhythm without murmurs, rubs, or gallops Lungs: clear to auscultation bilaterally with normal work of breathing Abdomen: soft, non-tender, non-distended, normoactive bowel sounds, no palpable masses Skin: warm, dry, no rashes or lesions, cap refill < 2 seconds Extremities: warm and well perfused, normal tone, no edema, diabetic foot exam performed  Assessment & Plan   Type 2 diabetes mellitus without complication ([place]) Uncontrolled due to nonadherence to medications.  A1c 8.9.  No history of insulin use or hypoglycemia. - Discussed at length the importance of lifestyle modifications including diet and exercise along with medication adherence - Given refill for metformin XR 1000 mg twice daily and Trulicity 75 mg daily along with diabetic supplies - RTC 1 month at which point we will check basic labs including CBC, CMET, microalbumin due to nonadherence  Primary hypertension Poorly controlled due to nonadherence for at least 2 months.  Also on high intensity statin given diabetes. - Given refill for Lipitor 40 mg daily - Plan to check a panel at next follow-up following adherence  Hypothyroidism (acquired) Uncontrolled based on elevated TSH 7 months ago.  Does have symptoms of fatigue.  No known CAD. - Given prescription for Synthroid 125 mcg daily - Plan to check TSH at follow-up in 1 month  Uterine fibroid Multiple fibroids based on CT.  Known heavy AUB without symptomatic anemia. - Ambulatory referral to  OB/GYN  Orders Placed This Encounter  Procedures  . Ambulatory referral to Obstetrics / Gynecology    Referral Priority:   Routine    Referral Type:   Consultation    Referral Reason:   Specialty Services Required    Requested Specialty:   Obstetrics and Gynecology    Number of Visits Requested:   1  . HgB A1c   Meds ordered this encounter  Medications  . ONETOUCH DELICA LANCETS 44Q MISC    Sig: Use up to twice daily testing. E11.9    Dispense:  100 each    Refill:  3  . losartan (COZAAR) 50 MG tablet    Sig: Take 1 tablet (50 mg total) by mouth daily.    Dispense:  90 tablet    Refill:  3  . metFORMIN (GLUCOPHAGE-XR) 500 MG 24 hr tablet    Sig: Take 2 tablets (1,000 mg total) by mouth 2 (two) times daily. Titrate as discussed in clinic up to 1000mg  twice daily    Dispense:  180 tablet    Refill:  3  . levothyroxine (SYNTHROID, LEVOTHROID) 125 MCG tablet    Sig: Take 1 tablet (125 mcg total) by mouth daily.    Dispense:  90 tablet    Refill:  3  . glucose blood (ONETOUCH VERIO) test strip    Sig: Use up to twice daily testing. E11.9    Dispense:  100 each    Refill:  12  . Dulaglutide (TRULICITY) 2.86 NO/1.7RN SOPN    Sig: Inject 0.75 mg into the skin once a week.    Dispense:  4 pen    Refill:  1  . atorvastatin (LIPITOR) 40 MG tablet    Sig: Take 1 tablet (40 mg total) by mouth daily.    Dispense:  90 tablet    Refill:  [place], [place], PGY-3 04/01/2018, 2:23 PM

## 2018-04-02 ENCOUNTER — Encounter: Payer: Self-pay | Admitting: Family Medicine

## 2018-04-03 ENCOUNTER — Other Ambulatory Visit: Payer: Self-pay | Admitting: Family Medicine

## 2018-04-03 MED ORDER — IBUPROFEN 800 MG PO TABS: 600.0000 mg | ORAL_TABLET | Freq: Three times a day (TID) | ORAL | 0 refills | Status: DC | PRN

## 2018-04-08 ENCOUNTER — Ambulatory Visit
Admission: RE | Admit: 2018-04-08 | Discharge: 2018-04-08 | Disposition: A | Discharge status: Home / Self Care | Payer: 59 | Source: Ambulatory Visit | Attending: Family Medicine | Admitting: Family Medicine

## 2018-04-08 DIAGNOSIS — Z1231 Encounter for screening mammogram for malignant neoplasm of breast: Secondary | ICD-10-CM | POA: Diagnosis not present

## 2018-04-08 IMAGING — MG DIGITAL SCREENING BILATERAL MAMMOGRAM WITH CAD
4 series · 4 of 4 positions shown · non-contrast
Comparison: Previous exam(s).

CLINICAL DATA: Screening.

EXAM:
DIGITAL SCREENING BILATERAL MAMMOGRAM WITH CAD

[R MLO]
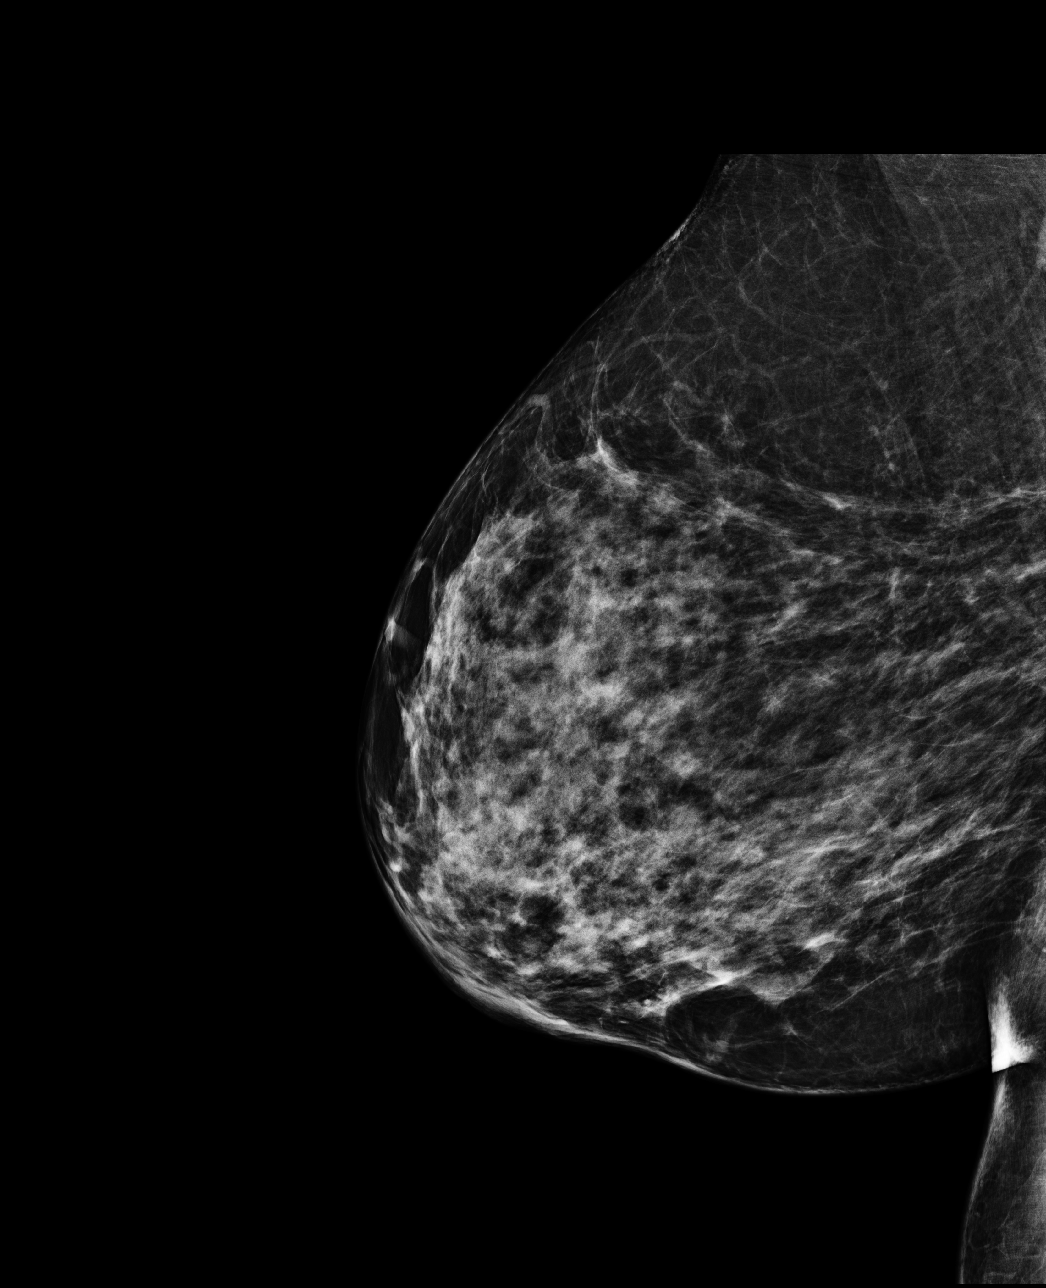

[L MLO]
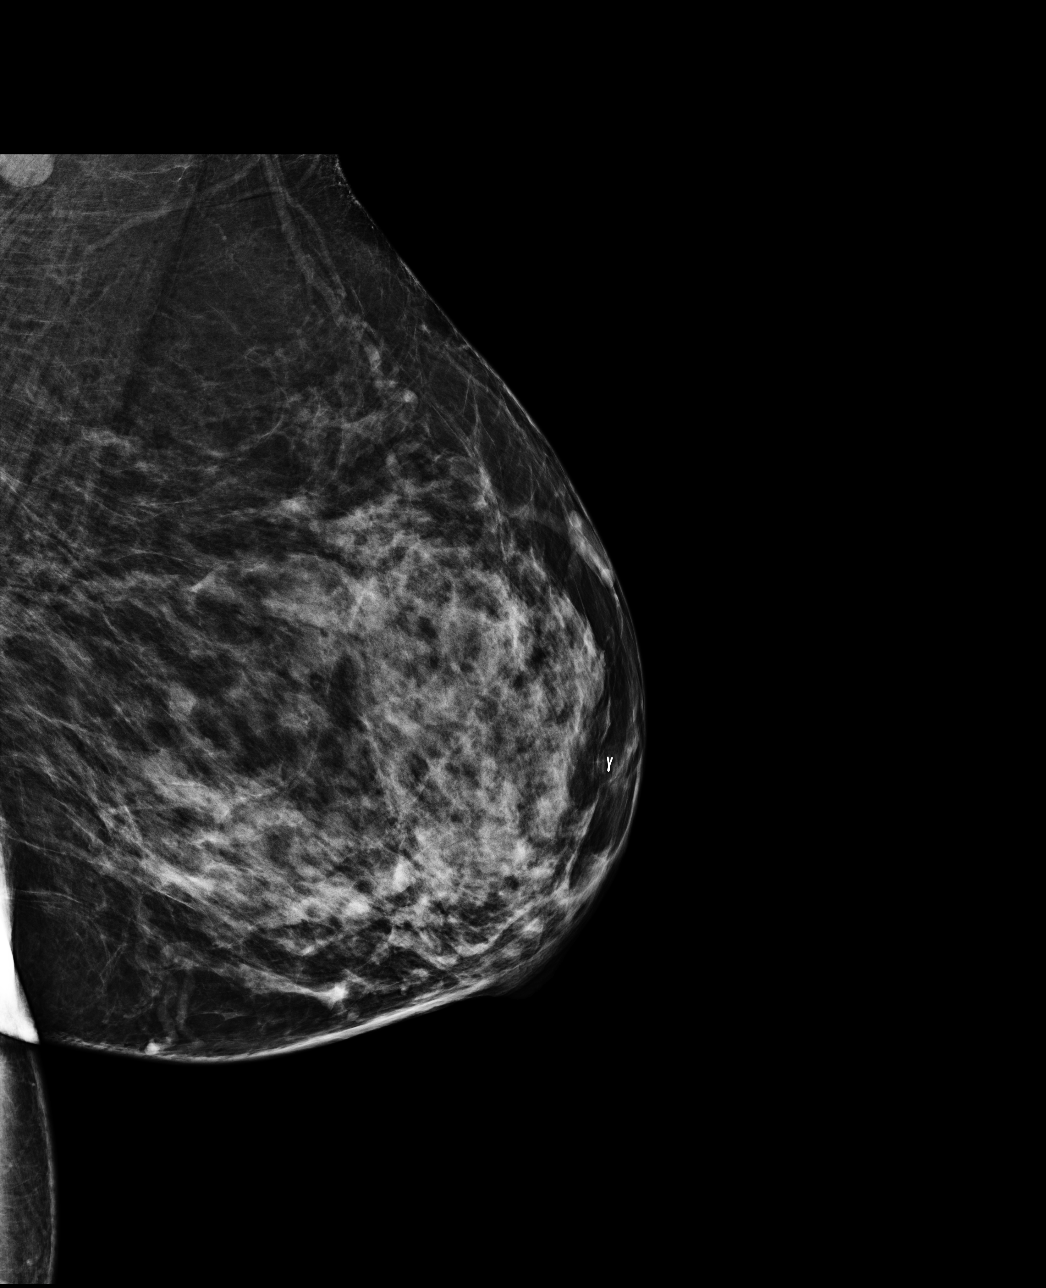

[L CC]
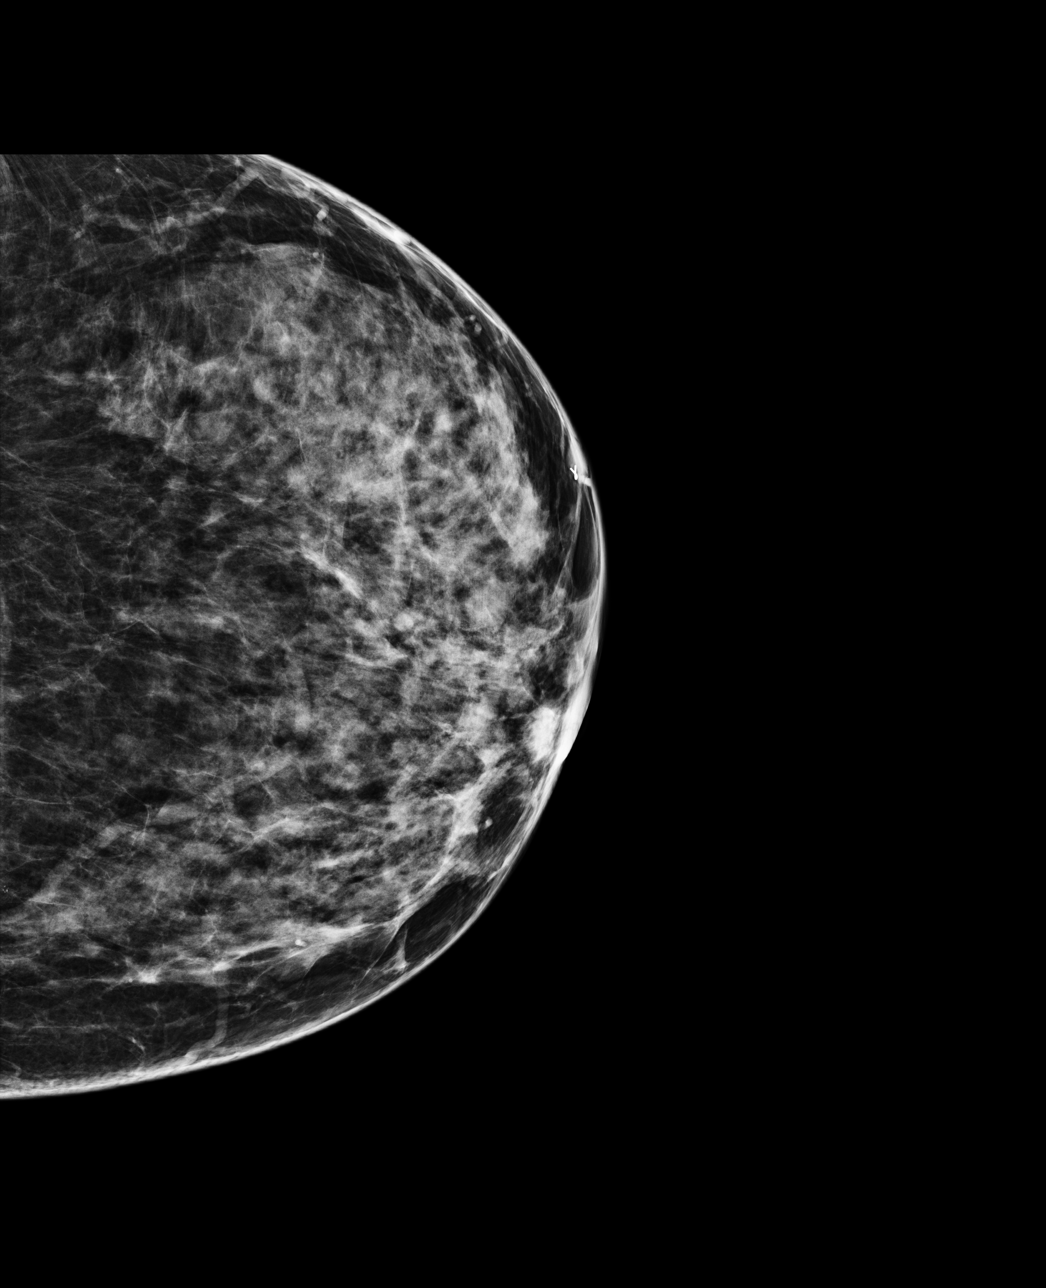

[R CC]
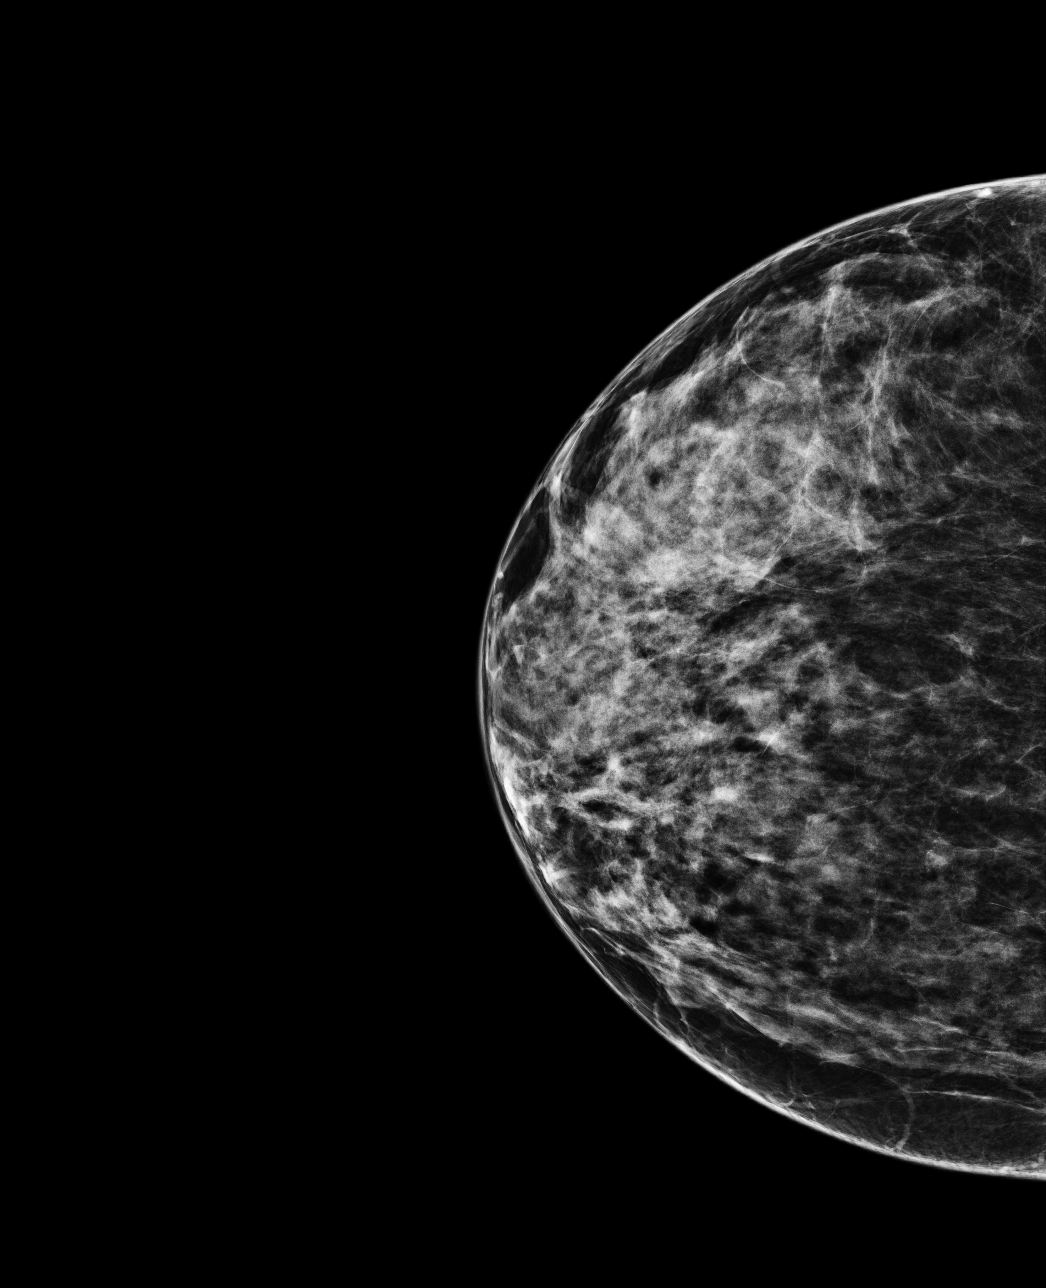

[4 of 4 positions shown; findings below may reference images not displayed]

ACR Breast Density Category c: The breast tissue is heterogeneously
dense, which may obscure small masses.
FINDINGS: There are no findings suspicious for malignancy. Images were
processed with CAD.
IMPRESSION: No mammographic evidence of malignancy. A result letter of this
screening mammogram will be mailed directly to the patient.

RECOMMENDATION:
Screening mammogram in one year. (Code:[0J])

BI-RADS CATEGORY  1: Negative.

## 2018-04-30 ENCOUNTER — Ambulatory Visit: Payer: 59 | Admitting: Obstetrics and Gynecology

## 2018-04-30 ENCOUNTER — Other Ambulatory Visit: Payer: Self-pay

## 2018-04-30 ENCOUNTER — Encounter: Payer: Self-pay | Admitting: Obstetrics and Gynecology

## 2018-04-30 VITALS — BP 128/76 | HR 93 | Ht 60.0 in | Wt 187.4 lb

## 2018-04-30 DIAGNOSIS — Z1151 Encounter for screening for human papillomavirus (HPV): Secondary | ICD-10-CM | POA: Diagnosis not present

## 2018-04-30 DIAGNOSIS — N939 Abnormal uterine and vaginal bleeding, unspecified: Secondary | ICD-10-CM

## 2018-04-30 DIAGNOSIS — D250 Submucous leiomyoma of uterus: Secondary | ICD-10-CM

## 2018-04-30 DIAGNOSIS — Z124 Encounter for screening for malignant neoplasm of cervix: Secondary | ICD-10-CM | POA: Diagnosis not present

## 2018-04-30 DIAGNOSIS — D252 Subserosal leiomyoma of uterus: Secondary | ICD-10-CM

## 2018-04-30 DIAGNOSIS — B009 Herpesviral infection, unspecified: Secondary | ICD-10-CM | POA: Insufficient documentation

## 2018-04-30 DIAGNOSIS — Z01419 Encounter for gynecological examination (general) (routine) without abnormal findings: Secondary | ICD-10-CM

## 2018-04-30 DIAGNOSIS — Z8742 Personal history of other diseases of the female genital tract: Secondary | ICD-10-CM

## 2018-04-30 MED ORDER — VALACYCLOVIR HCL 500 MG PO TABS: 500.0000 mg | ORAL_TABLET | Freq: Every day | ORAL | 12 refills | Status: DC

## 2018-04-30 MED ORDER — MEGESTROL ACETATE 40 MG PO TABS: 40.0000 mg | ORAL_TABLET | Freq: Two times a day (BID) | ORAL | 2 refills | Status: DC

## 2018-04-30 NOTE — Progress Notes (Signed)
GYNECOLOGY ANNUAL PREVENTATIVE CARE ENCOUNTER NOTE  Subjective:   Valerie Bauer is a 48 y.o. No obstetric history on file. female here for a annual gynecologic exam. Current complaints: fibroids and heavy bleeding. Had an episode in abdomen in 2018 where she had a significant amount of pain, went to ED and was diagnosed with fibroids. Has not seen a doctor due to insurance reasons. She reports pain before her cycles.   Menses are monthly, bleeds heavily for the first 3-4 days. Has large clots. Changing pads every 2 hours during heavy days. Occasionally has to abstain from daily activities in first few days because she has blood/clots running down her legs, staining clothes.   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 Very painful, usually comes before menses, source of stress for her.   Declines STI testing. Not currently sexually active.      Gynecologic History Patient's last menstrual period was 04/12/2018. Contraception: tubal ligation Last Pap: 02/2014. Results were: negative cytology, positive high risk HPV Last mammogram: 03/2018. Results were: [place]  Obstetric History OB History  Gravida Para Term Preterm AB Living  3 3 3     3   SAB TAB Ectopic Multiple Live Births          3    # Outcome Date GA Lbr Len/2nd Weight Sex Delivery Anes PTL Lv  3 Term      Vag-Spont   LIV  2 Term      Vag-Spont   LIV  1 Term      Vag-Spont   LIV    Past Medical History:  Diagnosis Date  . Diabetes mellitus without complication ([place])   . HSV infection    dx 2011  . Hypertension     Past Surgical History:  Procedure Laterality Date  . BREAST BIOPSY Left 11/20/2009  . CRYOABLATION     cervical cryoablation ~ 2014  . TUBAL LIGATION  1999    Current Outpatient Medications on File Prior to Visit  Medication Sig Dispense Refill  . atorvastatin (LIPITOR) 40 MG tablet Take 1 tablet (40 mg total) by mouth daily. 90 tablet 3  . Dulaglutide (TRULICITY) 4.70 JG/2.8ZM SOPN Inject  0.75 mg into the skin once a week. 4 pen 1  . ferrous sulfate 325 (65 FE) MG EC tablet Take 1 tablet (325 mg total) by mouth 3 (three) times daily with meals. (Patient taking differently: Take 325 mg by mouth daily. ) 90 tablet 3  . glucose blood (ONETOUCH VERIO) test strip Use up to twice daily testing. E11.9 100 each 12  . ibuprofen (ADVIL,MOTRIN) 800 MG tablet Take 1 tablet (800 mg total) by mouth every 8 (eight) hours as needed for moderate pain. 90 tablet 0  . Lancet Devices (ONE TOUCH DELICA LANCING DEV) MISC Use up to twice daily testing. E11.9 100 each 3  . levothyroxine (SYNTHROID, LEVOTHROID) 125 MCG tablet Take 1 tablet (125 mcg total) by mouth daily. 90 tablet 3  . losartan (COZAAR) 50 MG tablet Take 1 tablet (50 mg total) by mouth daily. 90 tablet 3  . metFORMIN (GLUCOPHAGE-XR) 500 MG 24 hr tablet Take 2 tablets (1,000 mg total) by mouth 2 (two) times daily. Titrate as discussed in clinic up to 1000mg  twice daily 180 tablet 3  . ONETOUCH DELICA LANCETS 62H MISC Use up to twice daily testing. E11.9 100 each 3  . ibuprofen (ADVIL,MOTRIN) 600 MG tablet Take 1 tablet (600 mg total) by mouth every 8 (eight) hours  as needed. (Patient not taking: Reported on 04/30/2018) 90 tablet 0  . traMADol (ULTRAM) 50 MG tablet Take 1 tablet (50 mg total) by mouth every 6 (six) hours as needed. (Patient not taking: Reported on 04/30/2018) 15 tablet 0   No current facility-administered medications on file prior to visit.     No Known Allergies  Social History   Socioeconomic History  . Marital status: Single    Spouse name: Not on file  . Number of children: Not on file  . Years of education: Not on file  . Highest education level: Not on file  Occupational History  . Not on file  Social Needs  . Financial resource strain: Not on file  . Food insecurity:    Worry: Not on file    Inability: Not on file  . Transportation needs:    Medical: Not on file    Non-medical: Not on file  Tobacco Use   . Smoking status: Never Smoker  . Smokeless tobacco: Never Used  Substance and Sexual Activity  . Alcohol use: No  . Drug use: No  . Sexual activity: Not Currently  Lifestyle  . Physical activity:    Days per week: Not on file    Minutes per session: Not on file  . Stress: Not on file  Relationships  . Social connections:    Talks on phone: Not on file    Gets together: Not on file    Attends religious service: Not on file    Active member of club or organization: Not on file    Attends meetings of clubs or organizations: Not on file    Relationship status: Not on file  . Intimate partner violence:    Fear of current or ex partner: Not on file    Emotionally abused: Not on file    Physically abused: Not on file    Forced sexual activity: Not on file  Other Topics Concern  . Not on file  Social History Narrative  . Not on file   Family History  Problem Relation Age of Onset  . Breast cancer Mother 85   The following portions of the patient's history were reviewed and updated as appropriate: allergies, current medications, past family history, past medical history, past social history, past surgical history and problem list.  Review of Systems Pertinent items are noted in HPI.   Objective:  BP 128/76   Pulse 93   Ht 5' (1.524 m)   Wt 187 lb 6.4 oz (85 kg)   LMP 04/12/2018   BMI 36.60 kg/m  CONSTITUTIONAL: Well-developed, well-nourished female in no acute distress.  HENT:  Normocephalic, atraumatic, External right and left ear normal. Oropharynx is clear and moist EYES: Conjunctivae and EOM are normal. Pupils are equal, round, and reactive to light. No scleral icterus.  NECK: Normal range of motion, supple, no masses.  Normal thyroid.  SKIN: Skin is warm and dry. No rash noted. Not diaphoretic. No erythema. No pallor. NEUROLOGIC: Alert and oriented to person, place, and time. Normal reflexes, muscle tone coordination. No cranial nerve deficit noted. PSYCHIATRIC:  Normal mood and affect. Normal behavior. Normal judgment and thought content. CARDIOVASCULAR: Normal heart rate noted, regular rhythm RESPIRATORY: Clear to auscultation bilaterally. Effort and breath sounds normal, no problems with respiration noted. BREASTS: deferred ABDOMEN: Soft, normal bowel sounds, no distention noted.  No tenderness, rebound or guarding. Enlarged uterus with fundus palpable at umbilicus PELVIC: Normal appearing external genitalia; normal appearing vaginal mucosa and cervix.  No abnormal discharge noted.  Pap smear obtained. Enlarged fibroid uterus to 22 weeks sized, mobile and mildly tender. No other palpable masses, no uterine or adnexal tenderness. MUSCULOSKELETAL: Normal range of motion. No tenderness.  No cyanosis, clubbing, or edema.  2+ distal pulses.  Assessment and Plan:   1. Well woman exam Enlarged uterus  2. H/O abnormal cervical Papanicolaou smear - Cytology - PAP( [place])  3. Submucous and subserous leiomyoma of uterus See below  4. Abnormal uterine bleeding (AUB) - Patient heavy, regular periods, multiple fibroids, desiring management - reviewed options for management, lower likelihood of hormonal management working for her heavy menses, that definitive treatment for fibroids of this size is removal, which would be done via hysterectomy, patient interested in hysterectomy - TVUS ordered - return for EMB - start megace for AUB in mean time  5. HSV infection - Occasional outbreaks, reports 5/yr - currently doing episodic therapy -  Reviewed option for suppressive therapy, risks/benefits, she is agreeable and would like to start suppressive therapy - script sent to pharmacy  Will follow up results of pap smear screen and manage accordingly. Encouraged improvement in diet and exercise.  Mammogram UTD Referral for colonoscopy n/a Flu vaccine UTD   Routine preventative health maintenance measures emphasized. Please refer to After Visit  Summary for other counseling recommendations.    Feliz Beam, M.D. Attending Center for [employer] farm manager)

## 2018-04-30 NOTE — Progress Notes (Signed)
New GYN is in the office. Last pap 03-09-14 Last mammogram 04-08-18. Pt was referred to our office for evaluation of fibroids. LMP 04-12-18, pt reports back pain after menstrual cycles.

## 2018-05-05 ENCOUNTER — Other Ambulatory Visit: Payer: Self-pay

## 2018-05-05 MED ORDER — TERCONAZOLE 0.8 % VA CREA: 1.0000 | TOPICAL_CREAM | Freq: Every day | VAGINAL | 0 refills | Status: DC

## 2018-05-06 ENCOUNTER — Ambulatory Visit: Payer: 59 | Admitting: Family Medicine

## 2018-05-06 ENCOUNTER — Other Ambulatory Visit: Payer: Self-pay

## 2018-05-06 VITALS — BP 124/80 | HR 84 | Temp 98.7°F | Wt 189.6 lb

## 2018-05-06 DIAGNOSIS — E1165 Type 2 diabetes mellitus with hyperglycemia: Secondary | ICD-10-CM

## 2018-05-06 DIAGNOSIS — E039 Hypothyroidism, unspecified: Secondary | ICD-10-CM | POA: Diagnosis not present

## 2018-05-06 DIAGNOSIS — IMO0001 Reserved for inherently not codable concepts without codable children: Secondary | ICD-10-CM

## 2018-05-06 LAB — CYTOLOGY - PAP
DIAGNOSIS: NEGATIVE
HPV 16/18/45 genotyping: NEGATIVE
HPV: DETECTED — AB

## 2018-05-06 NOTE — Progress Notes (Signed)
   Subjective   Patient ID: Valerie Bauer    DOB: 1970-10-15, 48 y.o. female   MRN: [id number]  CC: "follow-up"  HPI: Valerie Bauer is a 49 y.o. female who presents to clinic today for the following:  Diabetes: Valerie Bauer is presenting for routine diabetes follow-up.  She has a history of no adherence to her medications prior to her visit back in February with an A1c of 8.9.  At the time, she reported polyuria, polydipsia, and polyphagia.  The symptoms have resolved since restarting her metformin and Trulicity.  Her glucometer recently diet and she replaced the batteries 1 week ago.  Does report fasting sugars in the 200s prior to a dying.  Patient does snack on kettle corn chips and occasionally drinks cranberry juice after dinner which may be contributing.  She is motivated to control her diabetes to get the surgery she needs for her fibroids and seeing her OB/GYN.  She denies chest pain, shortness of breath, lower extremity swelling, change in vision.  Hypothyroidism: Continuing Synthroid at 125 mcg daily.  Denies palpitations, heat or cold intolerance, weight change.  She is here today to get her TSH rechecked.  ROS: see HPI for pertinent.  [place]: HTN, Graves disease, genital herpes, obesity. Surgical history breast biopsy. Family history cancer (mother, breast). Smoking status reviewed. Medications reviewed.  Objective   BP 124/80   Pulse 84   Temp 98.7 F (37.1 C) (Oral)   Wt 189 lb 9.6 oz (86 kg)   LMP 04/12/2018   SpO2 99%   BMI 37.03 kg/m  Vitals and nursing note reviewed.  General: well nourished, well developed, NAD with non-toxic appearance HEENT: normocephalic, atraumatic, moist mucous membranes Cardiovascular: regular rate and rhythm without murmurs, rubs, or gallops Lungs: clear to auscultation bilaterally with normal work of breathing Abdomen: soft, non-tender, non-distended, normoactive bowel sounds Skin: warm, dry, no rashes or lesions, cap refill < 2  seconds Extremities: warm and well perfused, normal tone, no edema  Assessment & Plan   Hypothyroidism (acquired) Chronic.  History of poor adherence to Synthroid.  No history of ACS. - Continue Synthroid at 125 mcg daily and will adjust based on TSH today  Uncontrolled type 2 diabetes mellitus without complication, without long-term current use of insulin (HCC) Now adherent to metformin and Trulicity.  Seems to be snacking on carb rich foods which is likely contributing to elevated glucose levels. - Set goal to avoid snacking or drinking after dinner except for Fridays and instructed to record fasting and postprandial glucose levels - Continue metformin XR at 1000 mg twice daily along with Trulicity 5.20 mg daily, RTC 2 months  Orders Placed This Encounter  Procedures  . CBC  . TSH  . Comprehensive metabolic panel    Order Specific Question:   Has the patient fasted?    Answer:   No  . Lipid Panel    Order Specific Question:   Has the patient fasted?    Answer:   No  . Microalbumin/Creatinine Ratio, Urine   No orders of the defined types were placed in this encounter.   [name], [place], PGY-3 05/06/2018, 4:11 PM

## 2018-05-06 NOTE — Patient Instructions (Signed)
Thank you for coming in to see [place] today. Please see below to review our plan for today's visit.  1.  Avoid eating or drinking anything after dinner except 1 day as a week.  Check your fasting glucose level in the morning and postprandial approximately 2 hours after lunch or dinner a couple of times during the week.  Be sure to bring your glucometer at your next visit in approximately 2 months. 2.  I will call you regarding the results and instruct you on what to do with your Synthroid.  Please call the clinic at [phone] if your symptoms worsen or you have any concerns. It was our pleasure to serve you.  [name], [place], PGY-3'

## 2018-05-06 NOTE — Assessment & Plan Note (Signed)
Now adherent to metformin and Trulicity.  Seems to be snacking on carb rich foods which is likely contributing to elevated glucose levels. - Set goal to avoid snacking or drinking after dinner except for Fridays and instructed to record fasting and postprandial glucose levels - Continue metformin XR at 1000 mg twice daily along with Trulicity 1.00 mg daily, RTC 2 months

## 2018-05-06 NOTE — Assessment & Plan Note (Signed)
Chronic.  History of poor adherence to Synthroid.  No history of ACS. - Continue Synthroid at 125 mcg daily and will adjust based on TSH today

## 2018-05-07 ENCOUNTER — Ambulatory Visit (HOSPITAL_COMMUNITY)
Admission: RE | Admit: 2018-05-07 | Discharge: 2018-05-07 | Disposition: A | Discharge status: Home / Self Care | Payer: 59 | Source: Ambulatory Visit | Attending: Obstetrics and Gynecology | Admitting: Obstetrics and Gynecology

## 2018-05-07 DIAGNOSIS — D250 Submucous leiomyoma of uterus: Secondary | ICD-10-CM | POA: Diagnosis not present

## 2018-05-07 DIAGNOSIS — D259 Leiomyoma of uterus, unspecified: Secondary | ICD-10-CM | POA: Diagnosis not present

## 2018-05-07 DIAGNOSIS — D252 Subserosal leiomyoma of uterus: Secondary | ICD-10-CM | POA: Diagnosis not present

## 2018-05-07 LAB — CBC
HEMATOCRIT: 31.6 % — AB (ref 34.0–46.6)
Hemoglobin: 8.7 g/dL — ABNORMAL LOW (ref 11.1–15.9)
MCH: 19.4 pg — ABNORMAL LOW (ref 26.6–33.0)
MCHC: 27.5 g/dL — ABNORMAL LOW (ref 31.5–35.7)
MCV: 70 fL — ABNORMAL LOW (ref 79–97)
Platelets: 346 10*3/uL (ref 150–450)
RBC: 4.49 x10E6/uL (ref 3.77–5.28)
RDW: 23.6 % — ABNORMAL HIGH (ref 11.7–15.4)
WBC: 6.1 10*3/uL (ref 3.4–10.8)

## 2018-05-07 LAB — LIPID PANEL
CHOLESTEROL TOTAL: 121 mg/dL (ref 100–199)
Chol/HDL Ratio: 3.1 ratio (ref 0.0–4.4)
HDL: 39 mg/dL — ABNORMAL LOW (ref >39)
LDL Calculated: 67 mg/dL (ref 0–99)
Triglycerides: 76 mg/dL (ref 0–149)
VLDL Cholesterol Cal: 15 mg/dL (ref 5–40)

## 2018-05-07 LAB — COMPREHENSIVE METABOLIC PANEL
ALT: 8 IU/L (ref 0–32)
AST: 9 IU/L (ref 0–40)
Albumin/Globulin Ratio: 1.9 (ref 1.2–2.2)
Albumin: 4.4 g/dL (ref 3.8–4.8)
Alkaline Phosphatase: 44 IU/L (ref 39–117)
BILIRUBIN TOTAL: 0.2 mg/dL (ref 0.0–1.2)
BUN/Creatinine Ratio: 18 (ref 9–23)
BUN: 12 mg/dL (ref 6–24)
CHLORIDE: 102 mmol/L (ref 96–106)
CO2: 22 mmol/L (ref 20–29)
Calcium: 8.6 mg/dL — ABNORMAL LOW (ref 8.7–10.2)
Creatinine, Ser: 0.67 mg/dL (ref 0.57–1.00)
GFR calc non Af Amer: 105 mL/min/{1.73_m2} (ref >59)
GFR, EST AFRICAN AMERICAN: 121 mL/min/{1.73_m2} (ref >59)
Globulin, Total: 2.3 g/dL (ref 1.5–4.5)
Glucose: 191 mg/dL — ABNORMAL HIGH (ref 65–99)
Potassium: 3.9 mmol/L (ref 3.5–5.2)
Sodium: 135 mmol/L (ref 134–144)
Total Protein: 6.7 g/dL (ref 6.0–8.5)

## 2018-05-07 LAB — MICROALBUMIN / CREATININE URINE RATIO
Creatinine, Urine: 59.6 mg/dL
Microalb/Creat Ratio: <5 mg/g creat (ref 0–29)

## 2018-05-07 LAB — TSH: TSH: 2.52 u[IU]/mL (ref 0.450–4.500)

## 2018-05-07 IMAGING — US US PELVIS COMPLETE WITH TRANSVAGINAL
1 series · 15 of 25 positions shown · non-contrast
Comparison: None

CLINICAL DATA: Leiomyoma.

EXAM:
TRANSABDOMINAL AND TRANSVAGINAL ULTRASOUND OF PELVIS
TECHNIQUE: Both transabdominal and transvaginal ultrasound examinations of the
pelvis were performed. Transabdominal technique was performed for
global imaging of the pelvis including uterus, ovaries, adnexal
regions, and pelvic cul-de-sac. It was necessary to proceed with
endovaginal exam following the transabdominal exam to visualize the
uterus and adnexal region.

[Series 1: us pelvis complete with transvaginal · 15 of 26 slices shown]
[im 1/26]
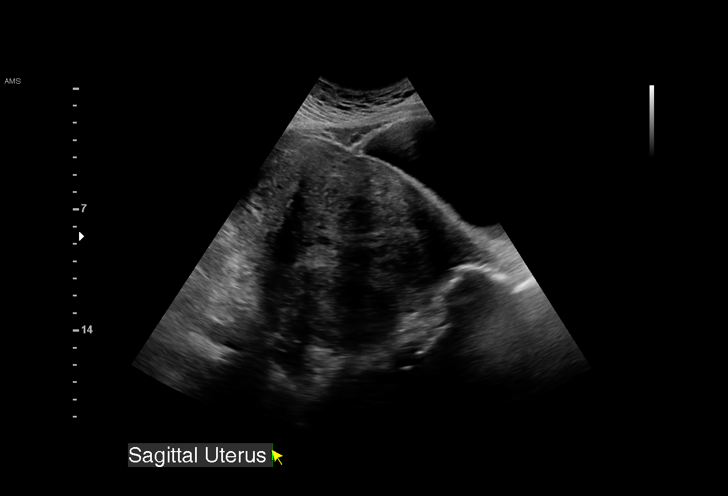
[im 3/26]
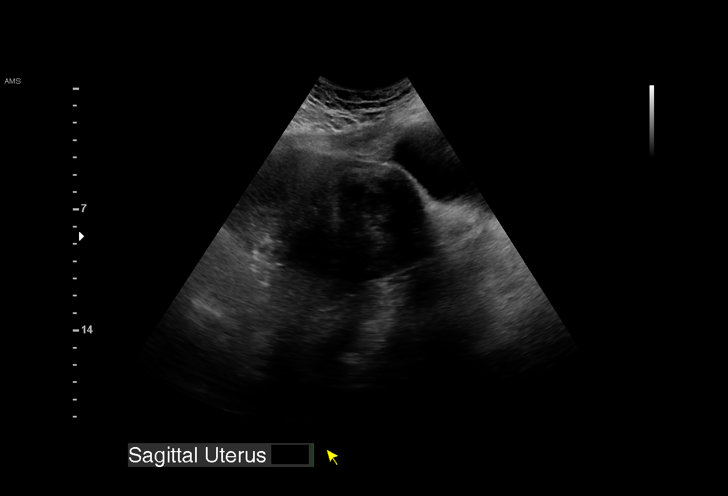
[im 5/26]
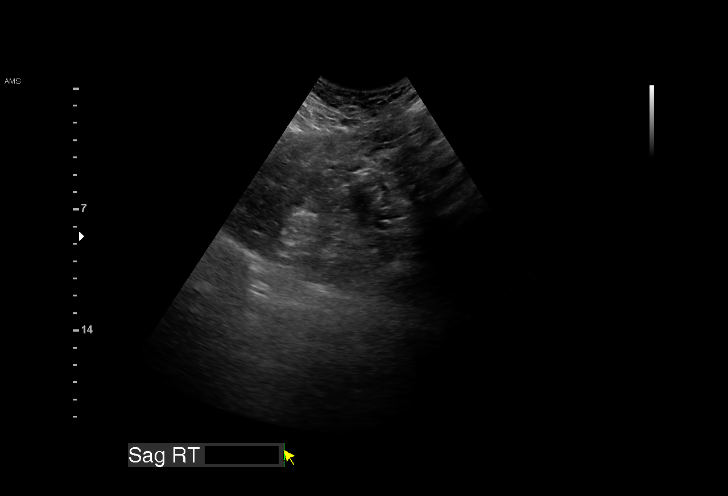
[im 6/26]
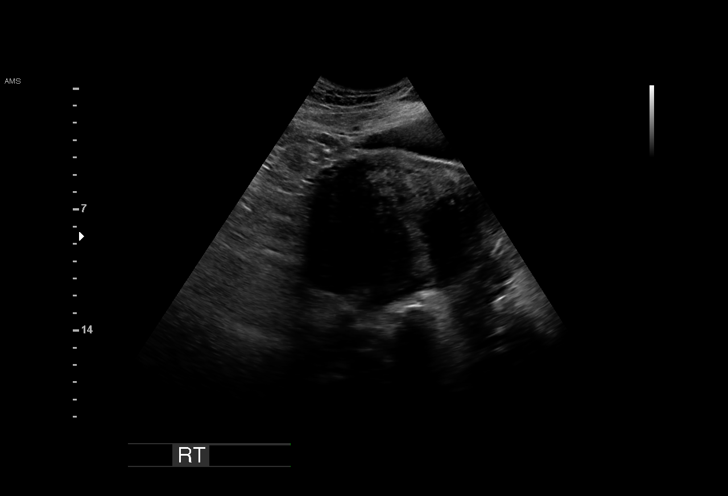
[im 8/26]
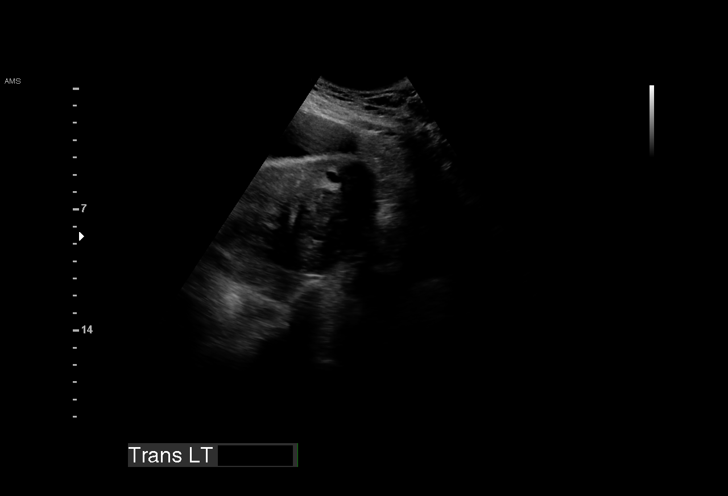
[im 10/26]
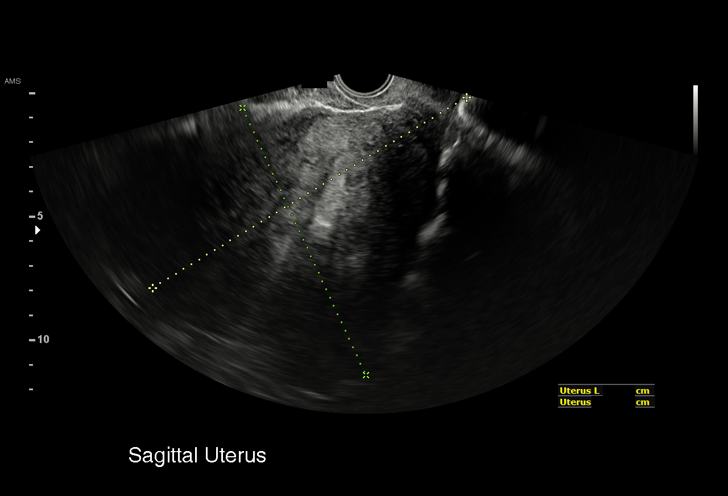
[im 11/26]
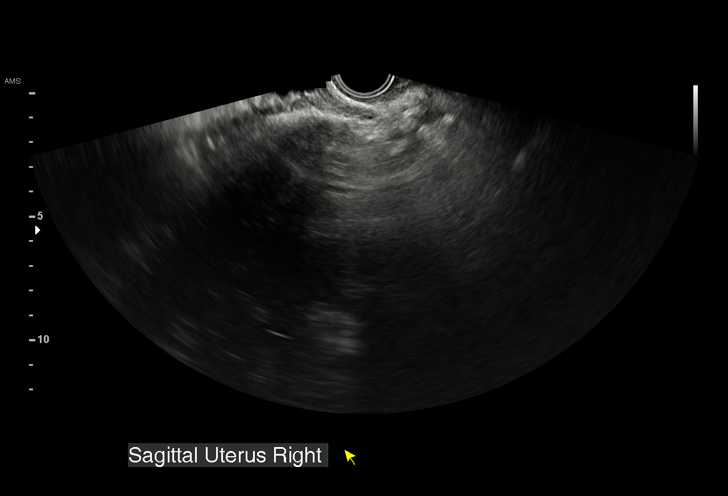
[im 13/26]
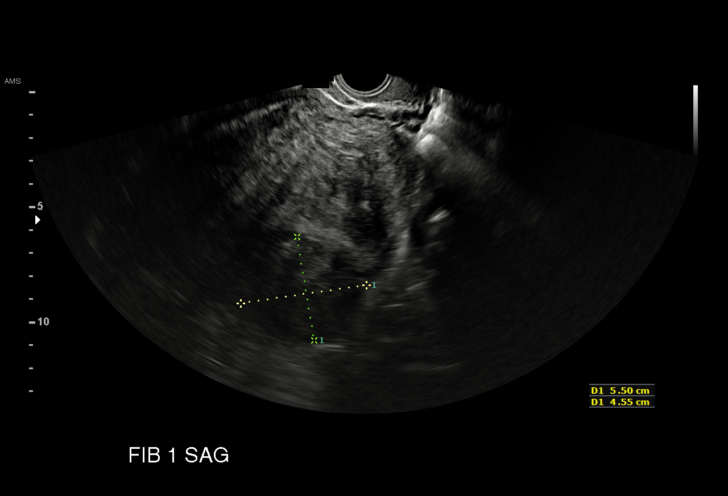
[im 15/26]
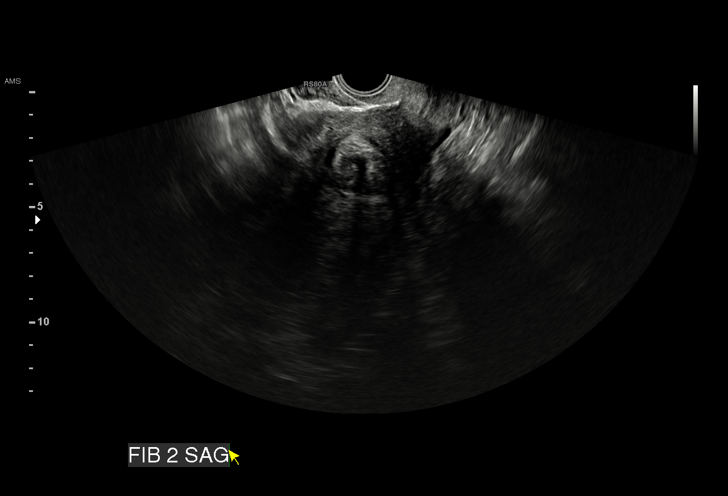
[im 16/26]
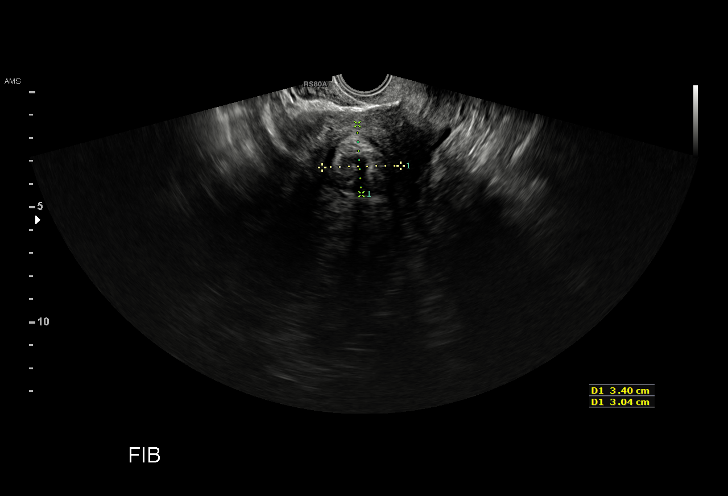
[im 18/26]
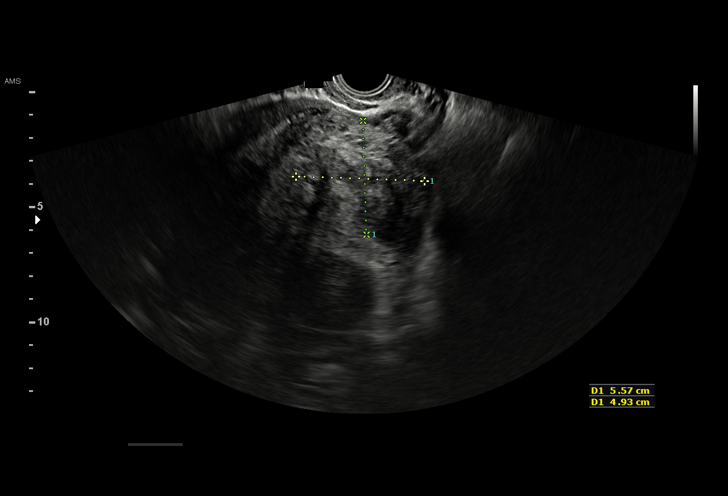
[im 20/26]
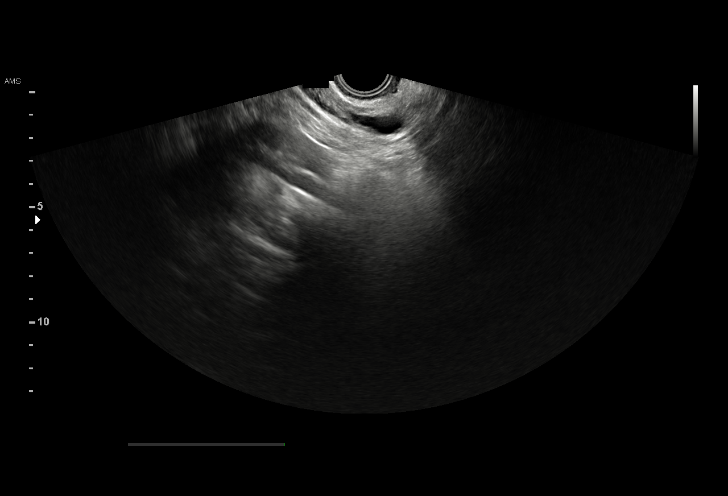
[im 21/26]
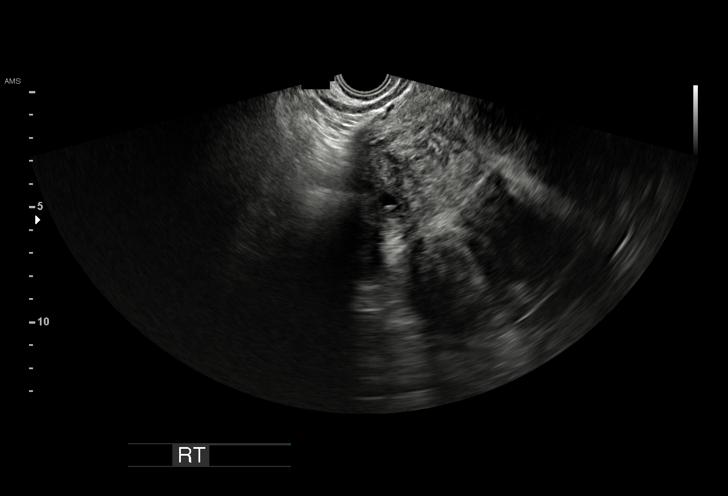
[im 23/26]
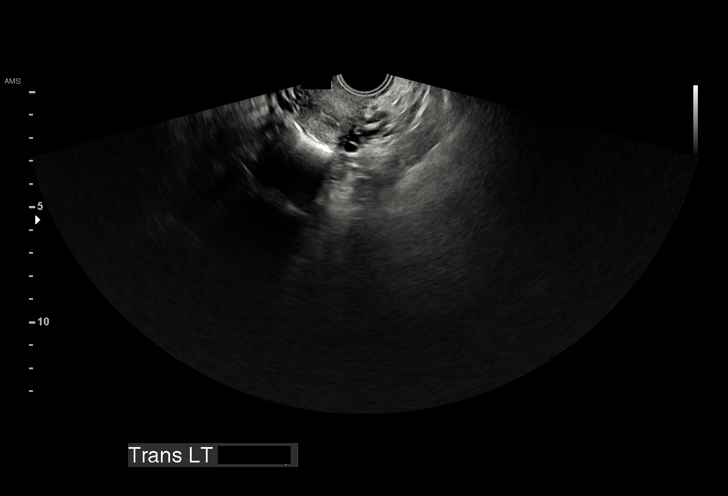
[im 26/26]
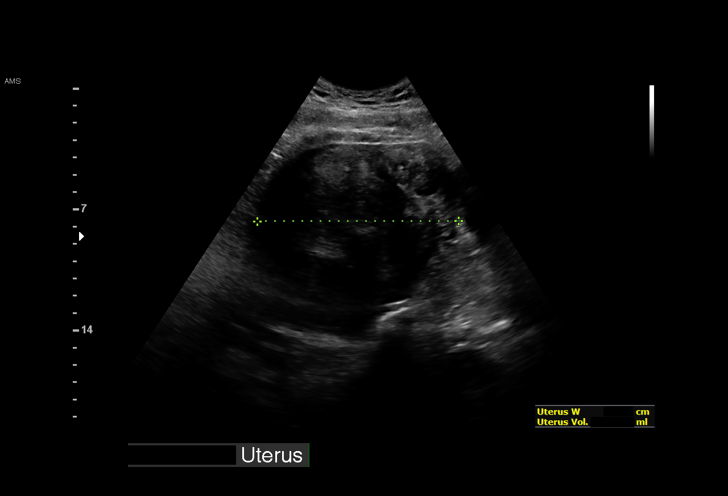

[15 of 25 positions shown; findings below may reference images not displayed]

FINDINGS: Uterus

Measurements: At least 14.9 x 1.9 x 11.6 centimeters = volume: [71]
mL. Multiple fibroids are present. Largest: Largest measure 5.5 x
4.5 x 5.9 centimeters, 3.4 x 3.0 x 3.9 centimeters, and 5.6 x 4.9 x
6.3 centimeters.

Endometrium

Thickness: The endometrium is obscured.

Right ovary

Measurements: The ovary is not visualized, either absent or obscured

Left ovary

Measurements: The ovary is not visualized, either absent or obscured

Other findings

No abnormal free fluid.
IMPRESSION: 1. Enlarged uterus with numerous fibroids.
2. Nonvisualized endometrial stripe and ovaries.

## 2018-05-21 ENCOUNTER — Telehealth: Payer: Self-pay | Admitting: *Deleted

## 2018-05-21 NOTE — Telephone Encounter (Signed)
Patient calls because a member of her church recently passed away from covid complications and her work wants to make sure she is ok to go back to work.   Last interaction with church member was 2+ weeks ago and pt is asymptomatic at this time.  Spoke with Dr. Andria Frames who agrees that pt is ok to go to work. Christen Bame, CMA

## 2018-06-01 ENCOUNTER — Ambulatory Visit: Payer: 59 | Admitting: Obstetrics and Gynecology

## 2018-06-01 ENCOUNTER — Other Ambulatory Visit: Payer: Self-pay

## 2018-06-01 ENCOUNTER — Other Ambulatory Visit (HOSPITAL_COMMUNITY)
Admission: RE | Admit: 2018-06-01 | Discharge: 2018-06-01 | Disposition: A | Discharge status: Home / Self Care | Payer: 59 | Source: Ambulatory Visit | Attending: Obstetrics and Gynecology | Admitting: Obstetrics and Gynecology

## 2018-06-01 ENCOUNTER — Encounter: Payer: Self-pay | Admitting: Obstetrics and Gynecology

## 2018-06-01 VITALS — BP 134/80 | HR 85 | Wt 184.2 lb

## 2018-06-01 DIAGNOSIS — N939 Abnormal uterine and vaginal bleeding, unspecified: Secondary | ICD-10-CM

## 2018-06-01 DIAGNOSIS — R102 Pelvic and perineal pain: Secondary | ICD-10-CM

## 2018-06-01 DIAGNOSIS — D259 Leiomyoma of uterus, unspecified: Secondary | ICD-10-CM

## 2018-06-01 DIAGNOSIS — N841 Polyp of cervix uteri: Secondary | ICD-10-CM | POA: Diagnosis not present

## 2018-06-01 MED ORDER — NORETHINDRONE ACETATE 5 MG PO TABS: 5.0000 mg | ORAL_TABLET | Freq: Every day | ORAL | 2 refills | Status: DC

## 2018-06-01 NOTE — Progress Notes (Signed)
GYNECOLOGY OFFICE FOLLOW UP NOTE  History:  48 y.o. [id number] here today for follow up for AUB. Here for EMB. Was started on megace 50 mg daily, improved her periods such that the first few days was lighter but did not stop it altogether so she stopped taking it after 14 days. Has been on her period since 05/11/18.   Past Medical History:  Diagnosis Date  . Diabetes mellitus without complication ([place])   . HSV infection    dx 2011  . Hypertension     Past Surgical History:  Procedure Laterality Date  . BREAST BIOPSY Left 11/20/2009  . CRYOABLATION     cervical cryoablation ~ 2014  . TUBAL LIGATION  1999    Current Outpatient Medications:  .  atorvastatin (LIPITOR) 40 MG tablet, Take 1 tablet (40 mg total) by mouth daily., Disp: 90 tablet, Rfl: 3 .  ferrous sulfate 325 (65 FE) MG EC tablet, Take 1 tablet (325 mg total) by mouth 3 (three) times daily with meals. (Patient taking differently: Take 325 mg by mouth daily. ), Disp: 90 tablet, Rfl: 3 .  glucose blood (ONETOUCH VERIO) test strip, Use up to twice daily testing. E11.9, Disp: 100 each, Rfl: 12 .  Lancet Devices (ONE TOUCH DELICA LANCING DEV) MISC, Use up to twice daily testing. E11.9, Disp: 100 each, Rfl: 3 .  levothyroxine (SYNTHROID, LEVOTHROID) 125 MCG tablet, Take 1 tablet (125 mcg total) by mouth daily., Disp: 90 tablet, Rfl: 3 .  losartan (COZAAR) 50 MG tablet, Take 1 tablet (50 mg total) by mouth daily., Disp: 90 tablet, Rfl: 3 .  metFORMIN (GLUCOPHAGE-XR) 500 MG 24 hr tablet, Take 2 tablets (1,000 mg total) by mouth 2 (two) times daily. Titrate as discussed in clinic up to '1000mg'$  twice daily, Disp: 180 tablet, Rfl: 3 .  ONETOUCH DELICA LANCETS 67M MISC, Use up to twice daily testing. E11.9, Disp: 100 each, Rfl: 3 .  valACYclovir (VALTREX) 500 MG tablet, Take 1 tablet (500 mg total) by mouth daily. Can increase to twice a day for 5 days in the event of a recurrence, Disp: 30 tablet, Rfl: 12 .  Dulaglutide (TRULICITY) 0.94  BS/9.6GE SOPN, Inject 0.75 mg into the skin once a week. (Patient not taking: Reported on 06/01/2018), Disp: 4 pen, Rfl: 1 .  ibuprofen (ADVIL,MOTRIN) 600 MG tablet, Take 1 tablet (600 mg total) by mouth every 8 (eight) hours as needed. (Patient not taking: Reported on 05/06/2018), Disp: 90 tablet, Rfl: 0 .  ibuprofen (ADVIL,MOTRIN) 800 MG tablet, Take 1 tablet (800 mg total) by mouth every 8 (eight) hours as needed for moderate pain., Disp: 90 tablet, Rfl: 0 .  megestrol (MEGACE) 40 MG tablet, Take 1 tablet (40 mg total) by mouth 2 (two) times daily. Start with once daily, increase to twice daily if no improvement in bleeding. (Patient not taking: Reported on 06/01/2018), Disp: 60 tablet, Rfl: 2 .  terconazole (TERAZOL 3) 0.8 % vaginal cream, Place 1 applicator vaginally at bedtime. (Patient not taking: Reported on 06/01/2018), Disp: 20 g, Rfl: 0 .  traMADol (ULTRAM) 50 MG tablet, Take 1 tablet (50 mg total) by mouth every 6 (six) hours as needed. (Patient not taking: Reported on 06/01/2018), Disp: 15 tablet, Rfl: 0  The following portions of the patient's history were reviewed and updated as appropriate: allergies, current medications, past family history, past medical history, past social history, past surgical history and problem list.   Review of Systems:  Pertinent items noted in HPI and  remainder of comprehensive ROS otherwise negative.   Objective:  Physical Exam BP 134/80   Pulse 85   Wt 184 lb 3.2 oz (83.6 kg)   LMP 05/11/2018 (Exact Date)   BMI 35.97 kg/m  CONSTITUTIONAL: Well-developed, well-nourished female in no acute distress.  HENT:  Normocephalic, atraumatic. External right and left ear normal. Oropharynx is clear and moist EYES: Conjunctivae and EOM are normal. Pupils are equal, round, and reactive to light. No scleral icterus.  NECK: Normal range of motion, supple, no masses SKIN: Skin is warm and dry. No rash noted. Not diaphoretic. No erythema. No pallor. NEUROLOGIC: Alert  and oriented to person, place, and time. Normal reflexes, muscle tone coordination. No cranial nerve deficit noted. PSYCHIATRIC: Normal mood and affect. Normal behavior. Normal judgment and thought content. CARDIOVASCULAR: Normal heart rate noted RESPIRATORY: Effort normal, no problems with respiration noted ABDOMEN: Soft, no distention noted.   PELVIC: normal appearing vaginal mucosa and cervix, bulkly fibroid uterus, please see separate EMB note for procedure details MUSCULOSKELETAL: Normal range of motion. No edema noted.  Labs and Imaging US Pelvic Complete With Transvaginal  Result Date: 05/07/2018 CLINICAL DATA:  Leiomyoma. EXAM: TRANSABDOMINAL AND TRANSVAGINAL ULTRASOUND OF PELVIS TECHNIQUE: Both transabdominal and transvaginal ultrasound examinations of the pelvis were performed. Transabdominal technique was performed for global imaging of the pelvis including uterus, ovaries, adnexal regions, and pelvic cul-de-sac. It was necessary to proceed with endovaginal exam following the transabdominal exam to visualize the uterus and adnexal region. COMPARISON:  None FINDINGS: Uterus Measurements: At least 14.9 x 1.9 x 11.6 centimeters = volume: 1075 mL. Multiple fibroids are present. Largest: Largest measure 5.5 x 4.5 x 5.9 centimeters, 3.4 x 3.0 x 3.9 centimeters, and 5.6 x 4.9 x 6.3 centimeters. Endometrium Thickness: The endometrium is obscured. Right ovary Measurements: The ovary is not visualized, either absent or obscured Left ovary Measurements: The ovary is not visualized, either absent or obscured Other findings No abnormal free fluid. IMPRESSION: 1. Enlarged uterus with numerous fibroids. 2. Nonvisualized endometrial stripe and ovaries. Electronically Signed   By: Nolon Nations M.D.   On: 05/07/2018 14:39    Assessment & Plan:   1. Abnormal uterine bleeding (AUB) Recommended for EMB today given AUB and minimal improvement in bleeding - Surgical pathology( [place]) -  reviewed options for management including hormonal management, [place], hysterectomy. Reviewed hysterectomy would be definitive management, that fibroids are likely source of bleeding and may not be able to be managed hormonally. Given that megace worked somewhat, will attempt different progestin for hormonal management. Patient asked about ablation, reviewed would not recommend ablation due to bulky fibroids. Reviewed hysterectomy would include uterus and fallopian tubes, would recommend removal of cervix given pap with +HPV. Reviewed risks/benefits of taking vs leaving ovaries in place, reviewed risk of ovarian cancer versus risks of increased CV issues and bone density issues with removal. Reviewed that with removal she will go into surgical menopause. Offered genetic testing to see if she carries BRCA as mom has ovarian "cysts" and breast cancer, patient would like to think about this and decide later.  - pt would like to move forward with hysterectomy given her ongoing bleeding, although will attempt medical management in the interim. Reviewed that with COVID-19, surgeries are being scheduled several months out. Will plan for abdominal hysterectomy, bilateral salpingectomy, possible oophorectomy. Will hvae patient return for pre-op appt approx 2 weeks prior to surgery.   2. Pelvic pain See above  3. Uterine leiomyoma, unspecified location See above  Routine preventative health maintenance measures emphasized. Please refer to After Visit Summary for other counseling recommendations.   Return in about 2 months (around 08/01/2018).  Total face-to-face time with patient: 25 minutes. Over 50% of encounter was spent on counseling and coordination of care.  Feliz Beam, M.D. Attending Center for [employer] farm manager)

## 2018-06-01 NOTE — Progress Notes (Signed)
Pt is here for follow up visit and endometrial biopsy. [place] on 05/07/18. Pt states that she has been on her menstrual cycle since 05/11/2018, bleeding is moderate.

## 2018-06-01 NOTE — Progress Notes (Signed)
ENDOMETRIAL BIOPSY      Valerie Bauer is a 48 y.o. [phone] here for endometrial biopsy.  The indications for endometrial biopsy were reviewed.  Risks of the biopsy including cramping, bleeding, infection, uterine perforation, inadequate specimen and need for additional procedures were discussed. The patient states she understands and agrees to undergo procedure today. Consent was signed. Time out was performed.   Indications: AUB Urine HCG: negative  A bivalve speculum was placed into the vagina and the cervix was easily visualized and was prepped with Betadine x2. A single-toothed tenaculum was placed on the anterior lip of the cervix to stabilize it. The 3 mm pipelle was introduced into the endometrial cavity without difficulty to a depth of 14 cm, and a moderate amount of tissue was obtained and sent to pathology. This was repeated for a total of 4 passes. The instruments were removed from the patient's vagina. Minimal bleeding from the cervix at the tenaculum was noted.   The patient tolerated the procedure well. Routine post-procedure instructions were given to the patient.     Feliz Beam, M.D. Attending Center for [employer] farm manager)

## 2018-06-09 ENCOUNTER — Telehealth: Payer: Self-pay | Admitting: Family Medicine

## 2018-06-09 ENCOUNTER — Encounter: Payer: Self-pay | Admitting: Family Medicine

## 2018-06-09 NOTE — Telephone Encounter (Signed)
Received MyChart msg from pt requesting I call to discuss endometrial biopsy results performed by Dr. Rosana Hoes with OBGYN on 06/01/2018. Report was read and look reassuring. Informed pt Dr. Shann Medal office should get in touch with her but no signs of hyperplasia or malignancy.  [name], [place], PGY-3

## 2018-07-01 ENCOUNTER — Encounter: Payer: Self-pay | Admitting: Family Medicine

## 2018-07-01 ENCOUNTER — Ambulatory Visit (INDEPENDENT_AMBULATORY_CARE_PROVIDER_SITE_OTHER): Payer: 59 | Admitting: Family Medicine

## 2018-07-01 ENCOUNTER — Other Ambulatory Visit: Payer: Self-pay

## 2018-07-01 VITALS — BP 145/80 | HR 72 | Wt 187.2 lb

## 2018-07-01 DIAGNOSIS — E1165 Type 2 diabetes mellitus with hyperglycemia: Secondary | ICD-10-CM | POA: Diagnosis not present

## 2018-07-01 DIAGNOSIS — IMO0001 Reserved for inherently not codable concepts without codable children: Secondary | ICD-10-CM

## 2018-07-01 LAB — POCT GLYCOSYLATED HEMOGLOBIN (HGB A1C): HbA1c, POC (controlled diabetic range): 8.7 % — AB (ref 0.0–7.0)

## 2018-07-01 NOTE — Assessment & Plan Note (Addendum)
-  A1c slightly improved to 8.7 today from 8.9 three months ago -Advised to take Metformin BID instead of once a day.  Has not been doing so to avoid GI side effects however she denies current side effects and is agreeable to taking this as prescribed  -Counseled extensively on diet and exercise, healthy eating options reviewed  -due for eye exam, pt is to call and schedule  -Plan to recheck A1c in 3 months

## 2018-07-01 NOTE — Progress Notes (Signed)
   Subjective:   Patient ID: Valerie Bauer    DOB: Jun 07, 1970, 48 y.o. female   MRN: [id number]  CC: diabetes f/u   HPI: Valerie Bauer is a 48 y.o. female who presents to clinic today for the following issue.  T2DM  -feels like she is doing well overall but could do better with her blood sugar control -Checking CBGs at home daily in the morning  -fasting CBGs are 170-190 -Current medication regimen: Metformin 1000 mg BID.  She is not taking her Trulicity anymore due to side effects.  -Reports that she takes this once a day because she has not been taking this as directed, she plans to start taking this the way she should in order to help better control her sugars prior to a fibroid surgery later this summer  -Denies symptoms of hypoglycemia  -Diet:has been staying away from sodas and drinking more water now, eats salads for lunch, 3 meals a day, does not limit carbohydrates much  -Exercise: walking at home  -Eye exam: 2016  -Foot exam: performed recently 3 months ago  ROS: See HPI for pertinent ROS.  Social: pt is a never smoker.  Medications reviewed. Objective:   BP (!) 145/80   Pulse 72   Wt 187 lb 3.2 oz (84.9 kg)   SpO2 99%   BMI 36.56 kg/m  Vitals and nursing note reviewed.  General: 48 yo female, NAD  Neck: supple CV: RRR no MRG  Lungs: CTAB, normal effort  Abdomen: soft, +bs  Skin: warm, dry Extremities: warm and well perfused Neuro: alert, oriented x3, no focal deficits   Assessment & Plan:   Uncontrolled type 2 diabetes mellitus without complication, without long-term current use of insulin (HCC) -A1c slightly improved to 8.7 today from 8.9 three months ago -Advised to take Metformin BID instead of once a day.  Has not been doing so to avoid GI side effects however she denies current side effects and is agreeable to taking this as prescribed  -Counseled extensively on diet and exercise, healthy eating options reviewed  -due for eye exam, pt is to  call and schedule  -Plan to recheck A1c in 3 months   Orders Placed This Encounter  Procedures  . HgB A1c   Lovenia Kim, MD [place] PGY-3

## 2018-08-03 ENCOUNTER — Other Ambulatory Visit: Payer: Self-pay

## 2018-08-03 ENCOUNTER — Encounter: Payer: Self-pay | Admitting: Obstetrics and Gynecology

## 2018-08-03 ENCOUNTER — Ambulatory Visit: Payer: 59 | Admitting: Obstetrics and Gynecology

## 2018-08-03 VITALS — BP 129/73 | HR 86 | Temp 97.3°F | Ht 60.0 in | Wt 185.7 lb

## 2018-08-03 DIAGNOSIS — Z01818 Encounter for other preprocedural examination: Secondary | ICD-10-CM

## 2018-08-03 DIAGNOSIS — N939 Abnormal uterine and vaginal bleeding, unspecified: Secondary | ICD-10-CM

## 2018-08-03 DIAGNOSIS — D259 Leiomyoma of uterus, unspecified: Secondary | ICD-10-CM | POA: Diagnosis not present

## 2018-08-03 NOTE — Progress Notes (Signed)
GYNECOLOGY OFFICE FOLLOW UP NOTE  History:  48 y.o. [id number] here today for follow up for AUB and pre-op visit. LMP was 07/12/18, had 2 heavy days, then was normal. Lasted 5 days. Has not had bleeding since. Stopped taking the aygestin when her bleeding went back to normal. Has not had pain outside of her periods.   Past Medical History:  Diagnosis Date   Diabetes mellitus without complication ([place])    HSV infection    dx 2011   Hypertension     Past Surgical History:  Procedure Laterality Date   BREAST BIOPSY Left 11/20/2009   CRYOABLATION     cervical cryoablation ~ 2014   TUBAL LIGATION  1999     Current Outpatient Medications:    atorvastatin (LIPITOR) 40 MG tablet, Take 1 tablet (40 mg total) by mouth daily., Disp: 90 tablet, Rfl: 3   ferrous sulfate 325 (65 FE) MG EC tablet, Take 1 tablet (325 mg total) by mouth 3 (three) times daily with meals. (Patient taking differently: Take 325 mg by mouth daily. ), Disp: 90 tablet, Rfl: 3   glucose blood (ONETOUCH VERIO) test strip, Use up to twice daily testing. E11.9, Disp: 100 each, Rfl: 12   ibuprofen (ADVIL,MOTRIN) 600 MG tablet, Take 1 tablet (600 mg total) by mouth every 8 (eight) hours as needed., Disp: 90 tablet, Rfl: 0   ibuprofen (ADVIL,MOTRIN) 800 MG tablet, Take 1 tablet (800 mg total) by mouth every 8 (eight) hours as needed for moderate pain., Disp: 90 tablet, Rfl: 0   Lancet Devices (ONE TOUCH DELICA LANCING DEV) MISC, Use up to twice daily testing. E11.9, Disp: 100 each, Rfl: 3   levothyroxine (SYNTHROID, LEVOTHROID) 125 MCG tablet, Take 1 tablet (125 mcg total) by mouth daily., Disp: 90 tablet, Rfl: 3   losartan (COZAAR) 50 MG tablet, Take 1 tablet (50 mg total) by mouth daily., Disp: 90 tablet, Rfl: 3   metFORMIN (GLUCOPHAGE-XR) 500 MG 24 hr tablet, Take 2 tablets (1,000 mg total) by mouth 2 (two) times daily. Titrate as discussed in clinic up to 1000mg  twice daily, Disp: 180 tablet, Rfl: 3    norethindrone (AYGESTIN) 5 MG tablet, Take 1 tablet (5 mg total) by mouth daily., Disp: 30 tablet, Rfl: 2   ONETOUCH DELICA LANCETS 24P MISC, Use up to twice daily testing. E11.9, Disp: 100 each, Rfl: 3   terconazole (TERAZOL 3) 0.8 % vaginal cream, Place 1 applicator vaginally at bedtime., Disp: 20 g, Rfl: 0   traMADol (ULTRAM) 50 MG tablet, Take 1 tablet (50 mg total) by mouth every 6 (six) hours as needed., Disp: 15 tablet, Rfl: 0   valACYclovir (VALTREX) 500 MG tablet, Take 1 tablet (500 mg total) by mouth daily. Can increase to twice a day for 5 days in the event of a recurrence, Disp: 30 tablet, Rfl: 12   Dulaglutide (TRULICITY) 8.09 XI/3.3AS SOPN, Inject 0.75 mg into the skin once a week. (Patient not taking: Reported on 06/01/2018), Disp: 4 pen, Rfl: 1   megestrol (MEGACE) 40 MG tablet, Take 1 tablet (40 mg total) by mouth 2 (two) times daily. Start with once daily, increase to twice daily if no improvement in bleeding. (Patient not taking: Reported on 08/03/2018), Disp: 60 tablet, Rfl: 2  The following portions of the patient's history were reviewed and updated as appropriate: allergies, current medications, past family history, past medical history, past social history, past surgical history and problem list.   Review of Systems:  Pertinent items noted in  HPI and remainder of comprehensive ROS otherwise negative.   Objective:  Physical Exam BP 129/73    Pulse 86    Temp (!) 97.3 F (36.3 C)    Ht 5' (1.524 m)    Wt 185 lb 11.2 oz (84.2 kg)    LMP 07/12/2018    BMI 36.27 kg/m  CONSTITUTIONAL: Well-developed, well-nourished female in no acute distress.  HENT:  Normocephalic, atraumatic. External right and left ear normal. Oropharynx is clear and moist EYES: Conjunctivae and EOM are normal. Pupils are equal, round, and reactive to light. No scleral icterus.  NECK: Normal range of motion, supple, no masses SKIN: Skin is warm and dry. No rash noted. Not diaphoretic. No erythema. No  pallor. NEUROLOGIC: Alert and oriented to person, place, and time. Normal reflexes, muscle tone coordination. No cranial nerve deficit noted. PSYCHIATRIC: Normal mood and affect. Normal behavior. Normal judgment and thought content. CARDIOVASCULAR: Normal heart rate noted RESPIRATORY: Effort normal, no problems with respiration noted ABDOMEN: Soft, no distention noted.   PELVIC: deferred MUSCULOSKELETAL: Normal range of motion. No edema noted.  Labs and Imaging Diagnosis Endometrium, biopsy - FRAGMENT OF BENIGN ENDOCERVICAL POLYP - OTHER FRAGMENTS OF BENIGN POLYPOID ENDOMETRIUM WITH HORMONE-EFFECT - NO EVIDENCE OF HYPERPLASIA OR MALIGNANCY Jaquita Folds MD Pathologist, Electronic Signature (Case signed 06/03/2018)  Assessment & Plan:   1. Uterine leiomyoma, unspecified location Reviewed benign path and polyp on EMB.  - Patient still desires hysterectomy despite normalized bleeding - Reviewed recommendation for abdominal hysterectomy and that given size of uterus, would be very difficult to do minimally invasive hysterectomy. The risks of abdominal hysterectomy were discussed with the patient; including but not limited to: infection which may require antibiotics; bleeding which may require transfusion or re-operation; injury to bowel, bladder, ureters or other surrounding organs; need for additional procedures, incisional problems, thromboembolic phenomenon and other postoperative/anesthesia complications. Reviewed that with a hysterectomy, she will not be able to have children in the future. Reviewed recommendation to remove fallopian tubes to reduce risk of ovarian cancer, but as she is 7, would not recommend removal of ovaries at this time. Gave option for genetic testing, she is agreeable to this. Reviewed that ovaries may be removed if they are abnormal appearing, anatomy requires it or if there is damage to blood supply. Reviewed that removal of ovaries would put her into surgical  menopause and we will attempt to avoid this if possible. Reviewed risks/benefits of keeping cervix and need for continued pap smears if she desires to keep her cervix, with +HPV on her pap, would recommend removal of cervix. Reviewed expected post surgery course and hospital stay.  Patient verbalized understanding of the above and consents to blood transfusion in the event of a life-threatening hemorrhage. Answered all questions.  - reviewed risks of surgery with uncontrolled diabetes, importance of improving glycemic control prior to surgery, she verbalizes understanding - will proceed with abdominal hysterectomy, bilateral salpingectomy - Invitae test and will decide on oophorectomy pending results of invitae  2. Abnormal uterine bleeding (AUB) See above  3. Pre-op exam See above  Routine preventative health maintenance measures emphasized. Please refer to After Visit Summary for other counseling recommendations.   No follow-ups on file.  Total face-to-face time with patient: 30 minutes. Over 50% of encounter was spent on counseling and coordination of care.  Feliz Beam, M.D. Attending Center for [employer] Fish farm manager)

## 2018-08-03 NOTE — Progress Notes (Signed)
Pt presents for pre op hyst.

## 2018-08-06 ENCOUNTER — Telehealth: Payer: Self-pay

## 2018-08-06 NOTE — Telephone Encounter (Signed)
TC to pt. We have a hyst form for pt to complete prior to surgery.  She will come to the office tomorrow to complete the form.

## 2018-08-10 ENCOUNTER — Telehealth: Payer: Self-pay

## 2018-08-10 NOTE — Telephone Encounter (Signed)
Per Dr. Rosana Hoes, she has requested for [facility] patient to get a Invitae drawn.  Called [facility] and they do not have testing to offer for the pt.  I called pt and informed pt that Dr. Rosana Hoes wants her to genetic testing.  Pt was hesitant and then agreed that she would come in on 08/14/18 for Invitae drawing.  [place] office notified.

## 2018-08-13 ENCOUNTER — Telehealth: Payer: Self-pay | Admitting: Family Medicine

## 2018-08-13 NOTE — Telephone Encounter (Signed)
Patient was called and instructed about her visit for 08/14/2018. Patient was screened for COVID-19 symptoms and denied any symptoms. Instructed to use the provided hand sanitizer when entering building. Patient was instructed about wearing a mask for their entire visit, and no visitors.

## 2018-08-14 ENCOUNTER — Other Ambulatory Visit: Payer: Self-pay

## 2018-08-14 ENCOUNTER — Other Ambulatory Visit: Payer: 59

## 2018-08-14 DIAGNOSIS — Z809 Family history of malignant neoplasm, unspecified: Secondary | ICD-10-CM

## 2018-08-27 ENCOUNTER — Encounter: Payer: Self-pay | Admitting: Family Medicine

## 2018-08-27 DIAGNOSIS — IMO0001 Reserved for inherently not codable concepts without codable children: Secondary | ICD-10-CM

## 2018-08-30 MED ORDER — METFORMIN HCL ER 500 MG PO TB24: 500.0000 mg | ORAL_TABLET | Freq: Two times a day (BID) | ORAL | 0 refills | Status: DC

## 2018-08-30 NOTE — Telephone Encounter (Signed)
Spoke to patient in regards to recall of Metformin. Patient is open to continuing the Metformin as long as it is from a different manufacturer. [facility] on [address]. They have 1 month supply in stock of alternative Metformin with future orders on back order at this time. Will call in 1 month supply at this time. Patient is open to receiving rx's from [facility] on [address] if needed in future.

## 2018-09-09 ENCOUNTER — Other Ambulatory Visit: Payer: Self-pay

## 2018-09-09 ENCOUNTER — Telehealth (INDEPENDENT_AMBULATORY_CARE_PROVIDER_SITE_OTHER): Payer: 59 | Admitting: Family Medicine

## 2018-09-09 DIAGNOSIS — R509 Fever, unspecified: Secondary | ICD-10-CM

## 2018-09-09 DIAGNOSIS — U071 COVID-19: Secondary | ICD-10-CM | POA: Insufficient documentation

## 2018-09-09 DIAGNOSIS — R6883 Chills (without fever): Secondary | ICD-10-CM | POA: Diagnosis not present

## 2018-09-09 DIAGNOSIS — R52 Pain, unspecified: Secondary | ICD-10-CM

## 2018-09-09 DIAGNOSIS — Z20828 Contact with and (suspected) exposure to other viral communicable diseases: Secondary | ICD-10-CM | POA: Diagnosis not present

## 2018-09-09 DIAGNOSIS — Z20822 Contact with and (suspected) exposure to covid-19: Secondary | ICD-10-CM

## 2018-09-09 NOTE — Assessment & Plan Note (Signed)
COVID Drive-Up Test Referral Criteria  Patient age: 48 y.o.  Symptoms: Fever, Cough, Chills and Muscle pain,   Underlying Conditions: HTN and Diabetes  Is the patient a first responder? No  Does the patient live or work in a high risk or high density environment: No  Is the patient a COVID convalescent patient who is 14-28 days symptom-free and interested in donating plasma for use as a therapeutic product? No

## 2018-09-09 NOTE — Progress Notes (Signed)
[place] Telemedicine Visit  Patient consented to have virtual visit. Method of visit: Telephone  Encounter participants: Patient: Naleah Kofoed - located at home Provider: Lyndee Hensen - located at [facility]  Others (if applicable):   Chief Complaint: Body Aches, Fever  HPI: Raphael Espe is a 48 y.o. female with hx of HTN, DM, HLD and hypothyroidism complains of body aches, fever, chills and non productive cough. Symptoms started this morning shortly after awakening.  Mrs. Peppers was getting dressed for work and states she had chills.  She drank some hot tea but the chills did not resolve. She denies any sick contacts. She works for [employer] and was screened before walking into work and did not have a fever.  She now has a fever, 100.9 F, cough, myalgias and chills.  There have been no known sick contacts.   Denies rhinorrhea, sore throat, nausea/vomiting and headache.  Prior to this morning Ms. Brass was well.      ROS: per HPI  Pertinent PMHx:   Past Medical History:  Diagnosis Date  . Diabetes mellitus without complication ([place])   . Hypertension      Exam:  Respiratory: Speaking full sentences without pausing to take a breath.   Assessment/Plan:  Close Exposure to Covid-19 Virus COVID Drive-Up Test Referral Criteria  Patient age: 48 y.o.  Symptoms: Fever, Cough, Chills and Muscle pain,   Underlying Conditions: HTN and Diabetes  Is the patient a first responder? No  Does the patient live or work in a high risk or high density environment: No  Is the patient a COVID convalescent patient who is 14-28 days symptom-free and interested in donating plasma for use as a therapeutic product? No      Time spent during visit with patient: 15 minutes

## 2018-09-09 NOTE — Patient Instructions (Signed)
Prevent the Spread of COVID-19 if You Are Sick If you are sick with COVID-19 or think you might have COVID-19, follow the steps below to help protect other people in your home and community. Stay home except to get medical care.  Stay home. Most people with COVID-19 have mild illness and are able to recover at home without medical care. Do not leave your home, except to get medical care. Do not visit public areas.  Take care of yourself. Get rest and stay hydrated.  Get medical care when needed. Call your doctor before you go to their office for care. But, if you have trouble breathing or other concerning symptoms, call 911 for immediate help.  Avoid public transportation, ride-sharing, or taxis. Separate yourself from other people and pets in your home.  As much as possible, stay in a specific room and away from other people and pets in your home. Also, you should use a separate bathroom, if available. If you need to be around other people or animals in or outside of the home, wear a cloth face covering. ? See COVID-19 and Animals if you have questions about pets: https://www.cdc.gov/coronavirus/2019-ncov/faq.html#COVID19animals Monitor your symptoms.  Common symptoms of COVID-19 include fever and cough. Trouble breathing is a more serious symptom that means you should get medical attention.  Follow care instructions from your healthcare provider and local health department. Your local health authorities will give instructions on checking your symptoms and reporting information. If you develop emergency warning signs for COVID-19 get medical attention immediately.  Emergency warning signs include*:  Trouble breathing  Persistent pain or pressure in the chest  New confusion or not able to be woken  Bluish lips or face *This list is not all inclusive. Please consult your medical provider for any other symptoms that are severe or concerning to you. Call 911 if you have a medical  emergency. If you have a medical emergency and need to call 911, notify the operator that you have or think you might have, COVID-19. If possible, put on a facemask before medical help arrives. Call ahead before visiting your doctor.  Call ahead. Many medical visits for routine care are being postponed or done by phone or telemedicine.  If you have a medical appointment that cannot be postponed, call your doctor's office. This will help the office protect themselves and other patients. If you are sick, wear a cloth covering over your nose and mouth.  You should wear a cloth face covering over your nose and mouth if you must be around other people or animals, including pets (even at home).  You don't need to wear the cloth face covering if you are alone. If you can't put on a cloth face covering (because of trouble breathing for example), cover your coughs and sneezes in some other way. Try to stay at least 6 feet away from other people. This will help protect the people around you. Note: During the COVID-19 pandemic, medical grade facemasks are reserved for healthcare workers and some first responders. You may need to make a cloth face covering using a scarf or bandana. Cover your coughs and sneezes.  Cover your mouth and nose with a tissue when you cough or sneeze.  Throw used tissues in a lined trash can.  Immediately wash your hands with soap and water for at least 20 seconds. If soap and water are not available, clean your hands with an alcohol-based hand sanitizer that contains at least 60% alcohol. Clean your hands often.    Wash your hands often with soap and water for at least 20 seconds. This is especially important after blowing your nose, coughing, or sneezing; going to the bathroom; and before eating or preparing food.  Use hand sanitizer if soap and water are not available. Use an alcohol-based hand sanitizer with at least 60% alcohol, covering all surfaces of your hands and rubbing  them together until they feel dry.  Soap and water are the best option, especially if your hands are visibly dirty.  Avoid touching your eyes, nose, and mouth with unwashed hands. Avoid sharing personal household items.  Do not share dishes, drinking glasses, cups, eating utensils, towels, or bedding with other people in your home.  Wash these items thoroughly after using them with soap and water or put them in the dishwasher. Clean all "high-touch" surfaces everyday.  Clean and disinfect high-touch surfaces in your "sick room" and bathroom. Let someone else clean and disinfect surfaces in common areas, but not your bedroom and bathroom.  If a caregiver or other person needs to clean and disinfect a sick person's bedroom or bathroom, they should do so on an as-needed basis. The caregiver/other person should wear a mask and wait as long as possible after the sick person has used the bathroom. High-touch surfaces include phones, remote controls, counters, tabletops, doorknobs, bathroom fixtures, toilets, keyboards, tablets, and bedside tables.  Clean and disinfect areas that may have blood, stool, or body fluids on them.  Use household cleaners and disinfectants. Clean the area or item with soap and water or another detergent if it is dirty. Then use a household disinfectant. ? Be sure to follow the instructions on the label to ensure safe and effective use of the product. Many products recommend keeping the surface wet for several minutes to ensure germs are killed. Many also recommend precautions such as wearing gloves and making sure you have good ventilation during use of the product. ? Most EPA-registered household disinfectants should be effective. How to discontinue home isolation  People with COVID-19 who have stayed home (home isolated) can stop home isolation under the following conditions: ? If you will not have a test to determine if you are still contagious, you can leave home  after these three things have happened:  You have had no fever for at least 72 hours (that is three full days of no fever without the use of medicine that reduces fevers) AND  other symptoms have improved (for example, when your cough or shortness of breath has improved) AND  at least 10 days have passed since your symptoms first appeared. ? If you will be tested to determine if you are still contagious, you can leave home after these three things have happened:  You no longer have a fever (without the use of medicine that reduces fevers) AND  other symptoms have improved (for example, when your cough or shortness of breath has improved) AND  you received two negative tests in a row, 24 hours apart. Your doctor will follow CDC guidelines. In all cases, follow the guidance of your healthcare provider and local health department. The decision to stop home isolation should be made in consultation with your healthcare provider and state and local health departments. Local decisions depend on local circumstances. cdc.gov/coronavirus 06/21/2018 This information is not intended to replace advice given to you by your health care provider. Make sure you discuss any questions you have with your health care provider. Document Released: 06/02/2018 Document Revised: 07/01/2018 Document Reviewed: 06/02/2018   Elsevier Patient Education  2020 Elsevier Inc.  

## 2018-09-11 ENCOUNTER — Telehealth (INDEPENDENT_AMBULATORY_CARE_PROVIDER_SITE_OTHER): Payer: 59 | Admitting: Lactation Services

## 2018-09-11 DIAGNOSIS — Z0001 Encounter for general adult medical examination with abnormal findings: Secondary | ICD-10-CM

## 2018-09-11 NOTE — Telephone Encounter (Signed)
Called to inform pt of her results of Genetic Testing for Cancer Markers. Relayed information per Dr. Rosana Hoes. Pt with no questions/concerns at this time.

## 2018-09-11 NOTE — Telephone Encounter (Signed)
-----   Message from Sloan Leiter, MD sent at 09/09/2018  7:33 PM EDT ----- Please call and let patient know that she was negative for any known cancer causing genetic markers, but that she did have two that are abnormal but considered unknown significance because we don't know what, if anything, that they will do. We will discuss further at her pre-op aappt.

## 2018-09-13 LAB — NOVEL CORONAVIRUS, NAA: SARS-CoV-2, NAA: DETECTED — AB

## 2018-09-14 ENCOUNTER — Encounter (INDEPENDENT_AMBULATORY_CARE_PROVIDER_SITE_OTHER): Payer: Self-pay

## 2018-09-14 NOTE — Addendum Note (Signed)
Addended by: Zenia Resides on: 09/14/2018 09:11 AM   Modules accepted: Orders

## 2018-09-14 NOTE — Progress Notes (Signed)
Called patient to ensure she had results.  She was already aware and had no questions.  Fortunately, she is feeling OK.  Only low grade fever and body aches.

## 2018-09-15 ENCOUNTER — Encounter (INDEPENDENT_AMBULATORY_CARE_PROVIDER_SITE_OTHER): Payer: Self-pay

## 2018-09-16 ENCOUNTER — Encounter (INDEPENDENT_AMBULATORY_CARE_PROVIDER_SITE_OTHER): Payer: Self-pay

## 2018-09-17 ENCOUNTER — Encounter (INDEPENDENT_AMBULATORY_CARE_PROVIDER_SITE_OTHER): Payer: Self-pay

## 2018-09-18 ENCOUNTER — Other Ambulatory Visit: Payer: Self-pay

## 2018-09-18 ENCOUNTER — Encounter (INDEPENDENT_AMBULATORY_CARE_PROVIDER_SITE_OTHER): Payer: Self-pay

## 2018-09-18 DIAGNOSIS — IMO0001 Reserved for inherently not codable concepts without codable children: Secondary | ICD-10-CM

## 2018-09-19 ENCOUNTER — Inpatient Hospital Stay (HOSPITAL_COMMUNITY)
Admission: EM | Admit: 2018-09-19 | Discharge: 2018-09-24 | DRG: 177 | Disposition: A | Discharge status: Home / Self Care | Payer: 59 | Attending: Internal Medicine | Admitting: Internal Medicine

## 2018-09-19 ENCOUNTER — Emergency Department (HOSPITAL_COMMUNITY): Payer: 59

## 2018-09-19 ENCOUNTER — Encounter (HOSPITAL_COMMUNITY): Payer: Self-pay

## 2018-09-19 ENCOUNTER — Other Ambulatory Visit: Payer: Self-pay

## 2018-09-19 ENCOUNTER — Encounter (INDEPENDENT_AMBULATORY_CARE_PROVIDER_SITE_OTHER): Payer: Self-pay

## 2018-09-19 DIAGNOSIS — B009 Herpesviral infection, unspecified: Secondary | ICD-10-CM | POA: Diagnosis present

## 2018-09-19 DIAGNOSIS — I10 Essential (primary) hypertension: Secondary | ICD-10-CM | POA: Diagnosis present

## 2018-09-19 DIAGNOSIS — E1169 Type 2 diabetes mellitus with other specified complication: Secondary | ICD-10-CM | POA: Diagnosis present

## 2018-09-19 DIAGNOSIS — J1289 Other viral pneumonia: Secondary | ICD-10-CM | POA: Diagnosis present

## 2018-09-19 DIAGNOSIS — Z794 Long term (current) use of insulin: Secondary | ICD-10-CM

## 2018-09-19 DIAGNOSIS — J1282 Pneumonia due to coronavirus disease 2019: Secondary | ICD-10-CM | POA: Diagnosis present

## 2018-09-19 DIAGNOSIS — E1165 Type 2 diabetes mellitus with hyperglycemia: Secondary | ICD-10-CM | POA: Diagnosis present

## 2018-09-19 DIAGNOSIS — U071 COVID-19: Secondary | ICD-10-CM | POA: Diagnosis present

## 2018-09-19 DIAGNOSIS — D500 Iron deficiency anemia secondary to blood loss (chronic): Secondary | ICD-10-CM | POA: Diagnosis not present

## 2018-09-19 DIAGNOSIS — D259 Leiomyoma of uterus, unspecified: Secondary | ICD-10-CM | POA: Diagnosis present

## 2018-09-19 DIAGNOSIS — E039 Hypothyroidism, unspecified: Secondary | ICD-10-CM | POA: Diagnosis present

## 2018-09-19 DIAGNOSIS — IMO0001 Reserved for inherently not codable concepts without codable children: Secondary | ICD-10-CM | POA: Diagnosis present

## 2018-09-19 DIAGNOSIS — E876 Hypokalemia: Secondary | ICD-10-CM | POA: Diagnosis present

## 2018-09-19 DIAGNOSIS — J988 Other specified respiratory disorders: Secondary | ICD-10-CM | POA: Diagnosis not present

## 2018-09-19 DIAGNOSIS — J9601 Acute respiratory failure with hypoxia: Secondary | ICD-10-CM | POA: Diagnosis present

## 2018-09-19 DIAGNOSIS — E0500 Thyrotoxicosis with diffuse goiter without thyrotoxic crisis or storm: Secondary | ICD-10-CM | POA: Diagnosis present

## 2018-09-19 DIAGNOSIS — Z79890 Hormone replacement therapy (postmenopausal): Secondary | ICD-10-CM

## 2018-09-19 DIAGNOSIS — D509 Iron deficiency anemia, unspecified: Secondary | ICD-10-CM | POA: Diagnosis present

## 2018-09-19 DIAGNOSIS — E785 Hyperlipidemia, unspecified: Secondary | ICD-10-CM | POA: Diagnosis present

## 2018-09-19 DIAGNOSIS — J90 Pleural effusion, not elsewhere classified: Secondary | ICD-10-CM

## 2018-09-19 LAB — BASIC METABOLIC PANEL
Anion gap: 12 (ref 5–15)
BUN: 8 mg/dL (ref 6–20)
CO2: 20 mmol/L — ABNORMAL LOW (ref 22–32)
Calcium: 8.6 mg/dL — ABNORMAL LOW (ref 8.9–10.3)
Chloride: 101 mmol/L (ref 98–111)
Creatinine, Ser: 0.65 mg/dL (ref 0.44–1.00)
GFR calc Af Amer: >60 mL/min (ref >60)
GFR calc non Af Amer: >60 mL/min (ref >60)
Glucose, Bld: 350 mg/dL — ABNORMAL HIGH (ref 70–99)
Potassium: 3.7 mmol/L (ref 3.5–5.1)
Sodium: 133 mmol/L — ABNORMAL LOW (ref 135–145)

## 2018-09-19 LAB — CBC
HCT: 33.6 % — ABNORMAL LOW (ref 36.0–46.0)
Hemoglobin: 9.5 g/dL — ABNORMAL LOW (ref 12.0–15.0)
MCH: 21.2 pg — ABNORMAL LOW (ref 26.0–34.0)
MCHC: 28.3 g/dL — ABNORMAL LOW (ref 30.0–36.0)
MCV: 74.8 fL — ABNORMAL LOW (ref 80.0–100.0)
Platelets: 325 10*3/uL (ref 150–400)
RBC: 4.49 MIL/uL (ref 3.87–5.11)
RDW: 18.7 % — ABNORMAL HIGH (ref 11.5–15.5)
WBC: 4.7 10*3/uL (ref 4.0–10.5)
nRBC: 0 % (ref 0.0–0.2)

## 2018-09-19 LAB — C-REACTIVE PROTEIN: CRP: 6.7 mg/dL — ABNORMAL HIGH (ref <1.0)

## 2018-09-19 LAB — CBG MONITORING, ED: Glucose-Capillary: 292 mg/dL — ABNORMAL HIGH (ref 70–99)

## 2018-09-19 LAB — TRIGLYCERIDES: Triglycerides: 167 mg/dL — ABNORMAL HIGH (ref <150)

## 2018-09-19 LAB — I-STAT BETA HCG BLOOD, ED (MC, WL, AP ONLY): I-stat hCG, quantitative: <5 m[IU]/mL (ref <5)

## 2018-09-19 LAB — FIBRINOGEN: Fibrinogen: 538 mg/dL — ABNORMAL HIGH (ref 210–475)

## 2018-09-19 LAB — D-DIMER, QUANTITATIVE: D-Dimer, Quant: 0.5 ug/mL-FEU (ref 0.00–0.50)

## 2018-09-19 LAB — LACTIC ACID, PLASMA
Lactic Acid, Venous: 1 mmol/L (ref 0.5–1.9)
Lactic Acid, Venous: 0.9 mmol/L (ref 0.5–1.9)

## 2018-09-19 LAB — LACTATE DEHYDROGENASE: LDH: 232 U/L — ABNORMAL HIGH (ref 98–192)

## 2018-09-19 LAB — FERRITIN: Ferritin: 16 ng/mL (ref 11–307)

## 2018-09-19 LAB — PROCALCITONIN: Procalcitonin: <0.1 ng/mL

## 2018-09-19 IMAGING — DX PORTABLE CHEST - 1 VIEW
1 series · 1 of 1 positions shown · non-contrast
Comparison: None.

CLINICAL DATA: Shortness of breath and cough.  [BW] positive

EXAM:
PORTABLE CHEST 1 VIEW

[chest]
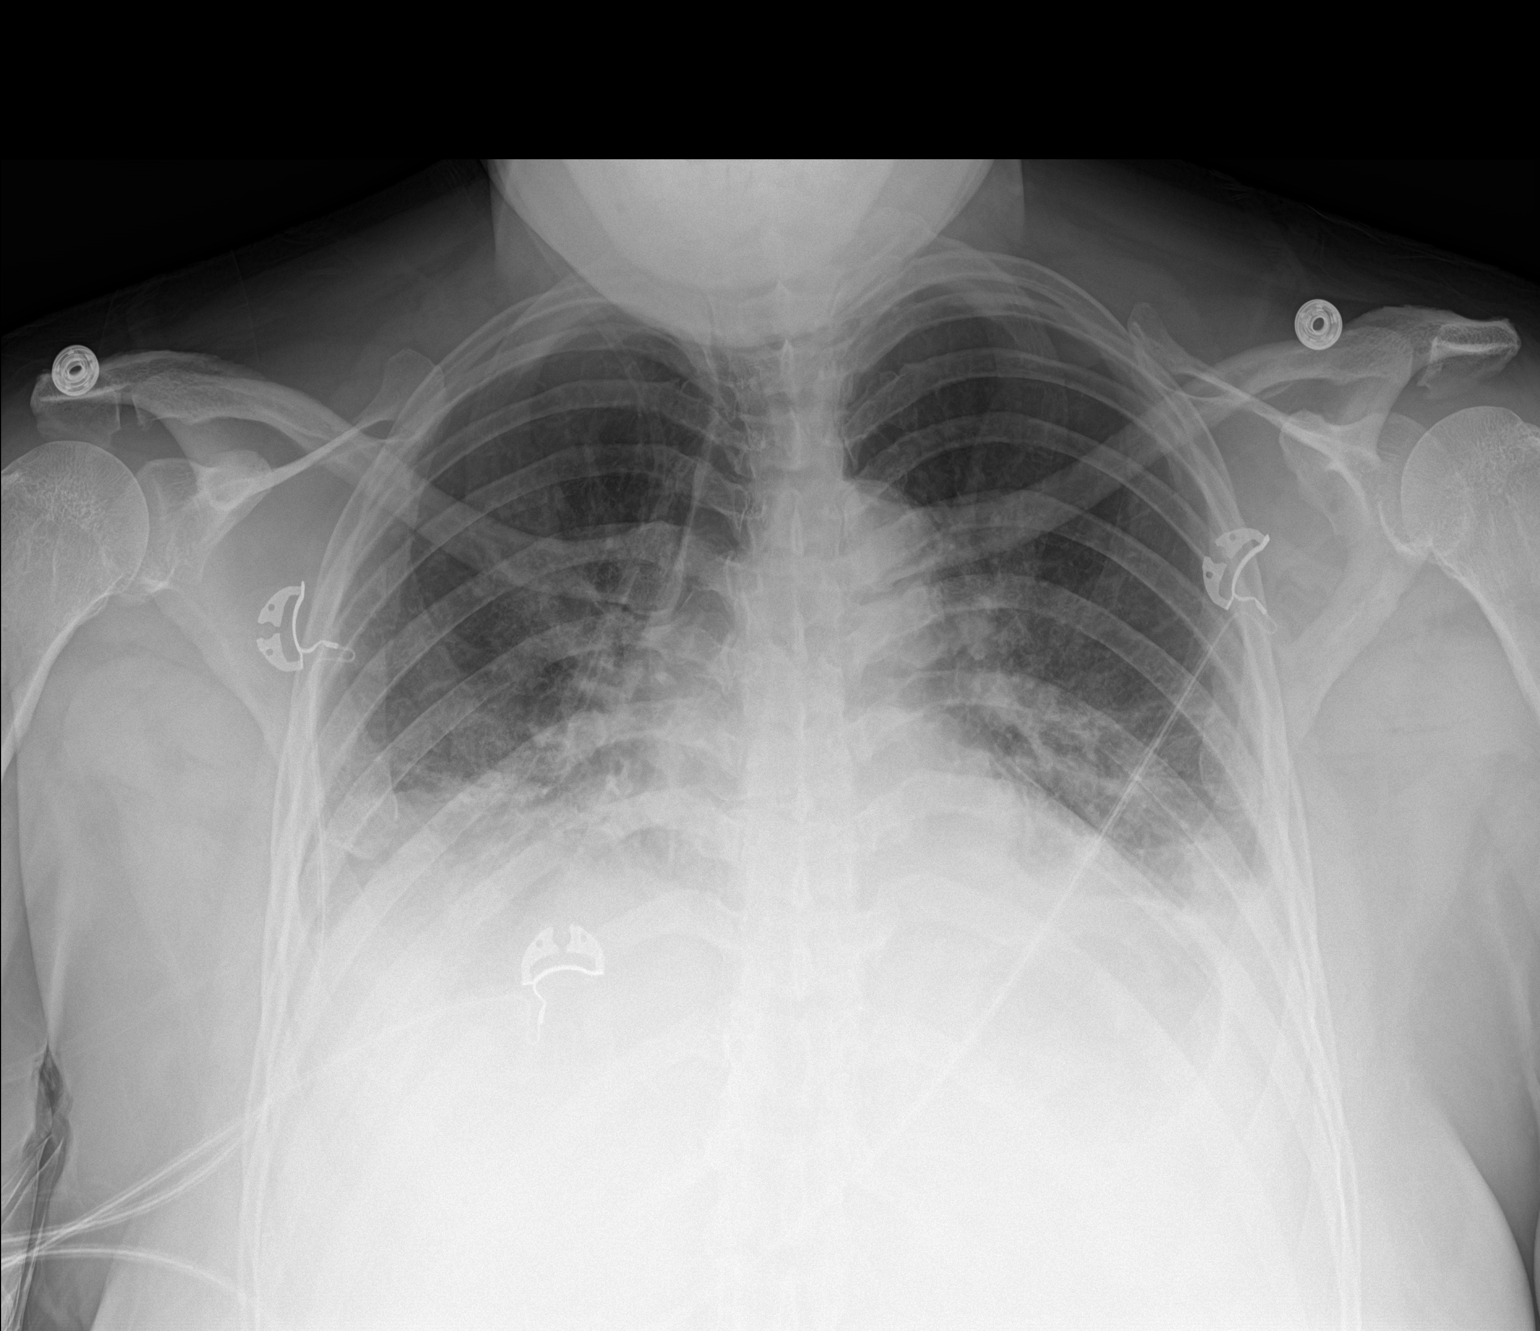

[1 of 1 positions shown; findings below may reference images not displayed]

FINDINGS: There is patchy airspace opacity in the lung bases with small
pleural effusions bilaterally. The lungs elsewhere are clear. Heart
is upper normal in size with pulmonary vascularity normal. No
adenopathy. No bone lesions.
IMPRESSION: Bibasilar airspace opacity consistent with pneumonia with small
pleural effusions bilaterally. Lungs elsewhere clear. No adenopathy.
Heart upper normal in size.

## 2018-09-19 MED ORDER — DULAGLUTIDE 0.75 MG/0.5ML ~~LOC~~ SOAJ: 0.7500 mg | SUBCUTANEOUS | Status: DC

## 2018-09-19 MED ORDER — SODIUM CHLORIDE 0.9 % IV SOLN: 250.0000 mL | INTRAVENOUS | Status: DC | PRN

## 2018-09-19 MED ORDER — SODIUM CHLORIDE 0.9% FLUSH
3.0000 mL | Freq: Once | INTRAVENOUS | Status: AC
  Administered 2018-09-19: 3 mL via INTRAVENOUS

## 2018-09-19 MED ORDER — ACETAMINOPHEN 325 MG PO TABS
650.0000 mg | ORAL_TABLET | Freq: Four times a day (QID) | ORAL | Status: DC | PRN
  Filled 2018-09-19: qty 2

## 2018-09-19 MED ORDER — INSULIN ASPART 100 UNIT/ML ~~LOC~~ SOLN
0.0000 [IU] | SUBCUTANEOUS | Status: DC
  Administered 2018-09-19 – 2018-09-20 (×2): 11 [IU] via SUBCUTANEOUS

## 2018-09-19 MED ORDER — SODIUM CHLORIDE 0.9% FLUSH
3.0000 mL | INTRAVENOUS | Status: DC | PRN
  Administered 2018-09-21 (×2): 3 mL via INTRAVENOUS
  Filled 2018-09-19 (×2): qty 3

## 2018-09-19 MED ORDER — LOSARTAN POTASSIUM 25 MG PO TABS
50.0000 mg | ORAL_TABLET | Freq: Every day | ORAL | Status: DC
  Administered 2018-09-20 – 2018-09-24 (×4): 50 mg via ORAL
  Filled 2018-09-19 (×5): qty 2

## 2018-09-19 MED ORDER — ATORVASTATIN CALCIUM 40 MG PO TABS
40.0000 mg | ORAL_TABLET | Freq: Every day | ORAL | Status: DC
  Administered 2018-09-20 – 2018-09-24 (×5): 40 mg via ORAL
  Filled 2018-09-19 (×5): qty 1

## 2018-09-19 MED ORDER — ZOLPIDEM TARTRATE 5 MG PO TABS
5.0000 mg | ORAL_TABLET | Freq: Every evening | ORAL | Status: DC | PRN
  Filled 2018-09-19: qty 1

## 2018-09-19 MED ORDER — TRAMADOL HCL 50 MG PO TABS: 50.0000 mg | ORAL_TABLET | Freq: Four times a day (QID) | ORAL | Status: DC | PRN

## 2018-09-19 MED ORDER — ENOXAPARIN SODIUM 40 MG/0.4ML ~~LOC~~ SOLN
40.0000 mg | Freq: Every day | SUBCUTANEOUS | Status: DC
  Administered 2018-09-20 – 2018-09-24 (×5): 40 mg via SUBCUTANEOUS
  Filled 2018-09-19 (×5): qty 0.4

## 2018-09-19 MED ORDER — SODIUM CHLORIDE 0.9% FLUSH
3.0000 mL | Freq: Two times a day (BID) | INTRAVENOUS | Status: DC
  Administered 2018-09-20 – 2018-09-23 (×7): 3 mL via INTRAVENOUS

## 2018-09-19 MED ORDER — LEVOTHYROXINE SODIUM 75 MCG PO TABS
125.0000 ug | ORAL_TABLET | Freq: Every day | ORAL | Status: DC
  Administered 2018-09-21 – 2018-09-24 (×4): 125 ug via ORAL
  Filled 2018-09-19 (×4): qty 1

## 2018-09-19 MED ORDER — FERROUS SULFATE 325 (65 FE) MG PO TABS
325.0000 mg | ORAL_TABLET | Freq: Every day | ORAL | Status: DC
  Administered 2018-09-20 – 2018-09-24 (×5): 325 mg via ORAL
  Filled 2018-09-19 (×5): qty 1

## 2018-09-19 MED ORDER — DOCUSATE SODIUM 100 MG PO CAPS
100.0000 mg | ORAL_CAPSULE | Freq: Two times a day (BID) | ORAL | Status: DC
  Administered 2018-09-20 – 2018-09-23 (×8): 100 mg via ORAL
  Filled 2018-09-19 (×9): qty 1

## 2018-09-19 NOTE — ED Triage Notes (Signed)
Pt reports worsening SOB since she was tested positive for COVID on Sunday. 90% on room air, 2L [place] applied. Resp unlabored at this time

## 2018-09-19 NOTE — H&P (Signed)
History and Physical    Valerie Bauer BHA:[id number] DOB: Jul 25, 1970 DOA: 09/19/2018  PCP: Danna Hefty, DO (Confirm with patient/family/NH records and if not entered, this has to be entered at [facility] point of entry) Patient coming from: Patient is coming from home  I have personally briefly reviewed patient's old medical records in [place]  Chief Complaint: Known COVID positive patient presents complaining of increased shortness of breath  HPI: Valerie Bauer is a 48 y.o. female with medical history significant of non-insulin-dependent diabetes, fibroid uterus, Graves' disease.  She is followed by the family practice clinic.  She tested positive for COVID-19 on September 09, 2018.  He presents on the day of admission with increasing shortness of breath however O2 saturation was 90% on room air.  She does complain of dyspnea on exertion.  She does have tachypnea.  Chest x-ray did reveal small bilateral effusions as well as typical viral infiltrates.  Inflammatory markers were positive.  Due to the patient's increasing shortness of breath and COVID positive status, abnormal chest x-ray, she is now referred to [facility] for [facility].   ED Course: Patient was stabilized in the emergency department.  She was hemodynamically stable.  Laboratories were drawn and this x-ray was ordered.  X-ray was abnormal as noted above.  Review of Systems: As per HPI otherwise 10 point review of systems negative.  Specifically denies loss of taste, loss of smell.  She has had myalgias.  Patient denies any neuropathy.   Past Medical History:  Diagnosis Date  . Diabetes mellitus without complication ([place])   . HSV infection    dx 2011  . Hypertension     Past Surgical History:  Procedure Laterality Date  . BREAST BIOPSY Left 11/20/2009  . CRYOABLATION     cervical cryoablation ~ 2014  . [employer] graduate. Works for [employer] Has 2 duaghters, 1 son,  6 grandchildren. She was married 2002 - 2006 divorced. Her children are from previous relationships. She lives alone but has family close by.   reports that she has never smoked. She has never used smokeless tobacco. She reports that she does not drink alcohol or use drugs.     No Known Allergies  Family History  Problem Relation Age of Onset  . Breast cancer Mother 3     Prior to Admission medications   Medication Sig Start Date End Date Taking? Authorizing Provider  acetaminophen (TYLENOL) 500 MG tablet Take 500 mg by mouth every 6 (six) hours as needed for mild pain or fever.   Yes [provider]  atorvastatin (LIPITOR) 40 MG tablet Take 1 tablet (40 mg total) by mouth daily. 04/01/18  Yes [place] Bing, DO  Dulaglutide (TRULICITY) 9.73 ZH/2.9JM SOPN Inject 0.75 mg into the skin once a week. Patient taking differently: Inject 0.75 mg into the skin every Sunday.  04/01/18  Yes [place] Bing, DO  ferrous sulfate 325 (65 FE) MG EC tablet Take 1 tablet (325 mg total) by mouth 3 (three) times daily with meals. Patient taking differently: Take 325 mg by mouth daily with breakfast.  07/19/16  Yes [place] Bing, DO  ibuprofen (ADVIL,MOTRIN) 800 MG tablet Take 1 tablet (800 mg total) by mouth every 8 (eight) hours as needed for moderate pain. 04/03/18  Yes  Bing, DO  levothyroxine (SYNTHROID, LEVOTHROID) 125 MCG tablet Take 1 tablet (125 mcg total) by mouth daily. 04/01/18  Yes [place] Bing, DO  losartan (COZAAR) 50 MG tablet Take 1 tablet (50 mg total) by mouth daily. 04/01/18  Yes [place] Bing, DO  metFORMIN (GLUCOPHAGE-XR) 500 MG 24 hr tablet Take 1 tablet (500 mg total) by mouth 2 (two) times a day. 08/30/18  Yes Mullis, Kiersten P, DO  valACYclovir (VALTREX) 500 MG tablet Take 1 tablet (500 mg total) by mouth daily. Can increase to twice a day for 5 days in the event of a recurrence Patient taking differently: Take 500 mg by mouth See admin instructions.  Take 500 mg by mouth once a day for 5 days in the event of a recurrence- may increase to 500 mg two times a day, as directed 04/30/18  Yes Sloan Leiter, MD  glucose blood (ONETOUCH VERIO) test strip Use up to twice daily testing. E11.9 04/01/18   [place] Bing, DO  ibuprofen (ADVIL,MOTRIN) 600 MG tablet Take 1 tablet (600 mg total) by mouth every 8 (eight) hours as needed. Patient not taking: Reported on 09/19/2018 12/16/16   Carlyle Dolly, MD  Lancet Devices (ONE TOUCH DELICA LANCING DEV) MISC Use up to twice daily testing. E11.9 07/18/16   Zenia Resides, MD  megestrol (MEGACE) 40 MG tablet Take 1 tablet (40 mg total) by mouth 2 (two) times daily. Start with once daily, increase to twice daily if no improvement in bleeding. Patient not taking: Reported on 09/19/2018 04/30/18   Sloan Leiter, MD  norethindrone (AYGESTIN) 5 MG tablet Take 1 tablet (5 mg total) by mouth daily. Patient not taking: Reported on 09/19/2018 06/01/18   Sloan Leiter, MD  [facility] DELICA LANCETS 31D MISC Use up to twice daily testing. E11.9 04/01/18   [place] Bing, DO  terconazole (TERAZOL 3) 0.8 % vaginal cream Place 1 applicator vaginally at bedtime. Patient not taking: Reported on 09/19/2018 05/05/18   Sloan Leiter, MD  traMADol (ULTRAM) 50 MG tablet Take 1 tablet (50 mg total) by mouth every 6 (six) hours as needed. Patient not taking: Reported on 09/19/2018 12/14/16   Lacretia Leigh, MD    Physical Exam: Vitals:   09/19/18 1648 09/19/18 1700 09/19/18 1817 09/19/18 1931  BP: (!) 136/91 (!) 141/89 (!) 142/90   Pulse: 89 91 86   Resp: 18 (!) 24 (!) 22   Temp:      TempSrc:      SpO2: 100% 98% 98%   Weight:    78.9 kg  Height:    5\' 5"  (1.651 m)    Constitutional: NAD, calm, comfortable Vitals:   09/19/18 1648 09/19/18 1700 09/19/18 1817 09/19/18 1931  BP: (!) 136/91 (!) 141/89 (!) 142/90   Pulse: 89 91 86   Resp: 18 (!) 24 (!) 22   Temp:      TempSrc:      SpO2: 100% 98% 98%   Weight:    78.9  kg  Height:    5\' 5"  (1.651 m)   General appearance: WNWD overweight woman in no acute distress Eyes: PERRL, lids and conjunctivae normal. Mild exopthalmos ENMT: Mucous membranes are moist. Posterior pharynx clear of any exudate or lesions.Normal dentition with recenlty pulled premolar left mandible..  Neck: normal, supple, no masses, no thyromegaly Respiratory:Decrease breathsound, worse at the bases. No wheezing, no Crackles Cardiovascular: Regular rate and rhythm, no murmurs / rubs / gallops. No extremity edema. 2+ pedal pulses. No carotid bruits.  Abdomen: protruberant no tenderness, no masses palpated. No hepatosplenomegaly. Bowel sounds positive.  Musculoskeletal: no clubbing / cyanosis. No joint deformity upper and lower extremities. Good ROM, no contractures. Normal muscle tone.  Skin: no rashes, lesions, ulcers. No induration Neurologic: CN 2-12 grossly intact. Sensation intact. Strength 5/5 in all 4.  Psychiatric: Normal judgment and insight. Alert and oriented x 3. Normal mood.     Labs on Admission: I have personally reviewed following labs and imaging studies  CBC: Recent Labs  Lab 09/19/18 1606  WBC 4.7  HGB 9.5*  HCT 33.6*  MCV 74.8*  PLT 400   Basic Metabolic Panel: Recent Labs  Lab 09/19/18 1606  NA 133*  K 3.7  CL 101  CO2 20*  GLUCOSE 350*  BUN 8  CREATININE 0.65  CALCIUM 8.6*   GFR: Estimated Creatinine Clearance: 89.3 mL/min (by C-G formula based on SCr of 0.65 mg/dL). Liver Function Tests: No results for input(s): AST, ALT, ALKPHOS, BILITOT, PROT, ALBUMIN in the last 168 hours. No results for input(s): LIPASE, AMYLASE in the last 168 hours. No results for input(s): AMMONIA in the last 168 hours. Coagulation Profile: No results for input(s): INR, PROTIME in the last 168 hours. Cardiac Enzymes: No results for input(s): CKTOTAL, CKMB, CKMBINDEX, TROPONINI in the last 168 hours. BNP (last 3 results) No results for input(s): PROBNP in the last  8760 hours. HbA1C: No results for input(s): HGBA1C in the last 72 hours. CBG: No results for input(s): GLUCAP in the last 168 hours. Lipid Profile: Recent Labs    09/19/18 1728  TRIG 167*   Thyroid Function Tests: No results for input(s): TSH, T4TOTAL, FREET4, T3FREE, THYROIDAB in the last 72 hours. Anemia Panel: Recent Labs    09/19/18 1728  FERRITIN 16   Urine analysis:    Component Value Date/Time   BILIRUBINUR negative 08/13/2017 1453   KETONESUR negative 08/13/2017 1453   PROTEINUR negative 08/13/2017 1453   UROBILINOGEN 0.2 08/13/2017 1453   NITRITE Negative 08/13/2017 1453   LEUKOCYTESUR Negative 08/13/2017 1453    Radiological Exams on Admission: Dg Chest Port 1 View  Result Date: 09/19/2018 CLINICAL DATA:  Shortness of breath and cough.  COVID-19 positive EXAM: PORTABLE CHEST 1 VIEW COMPARISON:  None. FINDINGS: There is patchy airspace opacity in the lung bases with small pleural effusions bilaterally. The lungs elsewhere are clear. Heart is upper normal in size with pulmonary vascularity normal. No adenopathy. No bone lesions. IMPRESSION: Bibasilar airspace opacity consistent with pneumonia with small pleural effusions bilaterally. Lungs elsewhere clear. No adenopathy. Heart upper normal in size. Electronically Signed   By: Lowella Grip III M.D.   On: 09/19/2018 17:21    EKG: Independently reviewed. Sinus tachycardia, no definitive acute changes.  Assessment/Plan Active Problems:   Pneumonia due to COVID-19 virus   Primary hypertension   Uncontrolled type 2 diabetes mellitus without complication, without long-term current use of insulin ([place])   Hyperlipidemia associated with type 2 diabetes mellitus ([place])   Iron deficiency anemia   Hypothyroidism (acquired)   COVID-19 virus detected  (please populate well all problems here in Problem List. (For example, if patient is on BP meds at home and you resume or decide to hold them, it is a problem that needs to be  her. Same for CAD, COPD, HLD and so on)   1.  COVID-19 related pneumonia -patient with abnormal chest x-ray with viral type infiltrates and bilateral pleural effusions.  Inflammatory markers are elevated.  Patient is oxygenating well both on room air and also on 2 L.  Does admit to mild  dyspnea on exertion.  She is not tachypneic at my exam Plan observation admission to a MedSurg bed [facility]  Continue low flow oxygen at 2 L per nasal cannula  Follow-up laboratory in a.m.  Follow-up chest x-ray before discharge or if her condition worsens  2. Primary hypertension -well-controlled on current medications.  We will continue her home medications  3.  Diabetes -last hemoglobin A1c from 07/01/2018 was 8.7%. Plan continue Trulicity  Sliding scale with no basal insulin coverage no before bedtime coverage  4.  Abnormal EKG -patient with sinus tachycardia.  No evidence of acute injury. Plan follow-up EKG in the morning  5.  Hypothyroidism -TSH was normal.  Continue the patient's home medication  This patient was examined with the examiner wearing full PPE including N 95 mask, double gloved, full gown, face shield.  Appropriate gowning and doffing were carried out.  DVT prophylaxis: Lovenox (Lovenox/Heparin/SCD's/anticoagulated/None (if comfort care) Code Status: Full code (Full/Partial (specify details) Family Communication: She is been communicating with her daughter and does not request that I contact her at this time (Specify name, relationship. Do not write "discussed with patient". Specify tel # if discussed over the phone) Disposition Plan: Home after 2 midnights (specify when and where you expect patient to be discharged) Consults called: None (with names) Admission status: Observation (inpatient / obs / tele / medical floor / SDU)   Adella Hare MD [facility] Pager [phone]  If 7PM-7AM, please contact night-coverage [email or web address] Password [facility]  09/19/2018, 7:55 PM

## 2018-09-19 NOTE — ED Notes (Signed)
[facility] called for transport to [facility]

## 2018-09-19 NOTE — ED Notes (Signed)
ED TO INPATIENT HANDOFF REPORT  ED Nurse Name and Phone #: Tyliah Schlereth [phone]  S Name/Age/Gender Valerie Bauer 48 y.o. female Room/Bed: 026C/026C  Code Status   Code Status: Full Code  Home/SNF/Other Home Patient oriented to: self, place, time and situation Is this baseline? Yes   Triage Complete: Triage complete  Chief Complaint covid positive  Triage Note Pt reports worsening SOB since she was tested positive for COVID on Sunday. 90% on room air, 2L [place] applied. Resp unlabored at this time   Allergies No Known Allergies  Level of Care/Admitting Diagnosis ED Disposition    ED Disposition Condition [facility] Area: [place] [[id number]]  Level of Care: Med-Surg [16]  Covid Evaluation: Confirmed COVID Positive  Diagnosis: Pneumonia due to COVID-19 virus [[id number]]  Admitting Physician: Neena Rhymes [[id number]]  Attending Physician: Adella Hare E [[id number]]  Estimated length of stay: past midnight tomorrow  Certification:: I certify this patient will need inpatient services for at least 2 midnights  PT Class (Do Not Modify): Inpatient [101]  PT Acc Code (Do Not Modify): Private [1]       B Medical/Surgery History Past Medical History:  Diagnosis Date  . Diabetes mellitus without complication ([place])   . HSV infection    dx 2011  . Hypertension    Past Surgical History:  Procedure Laterality Date  . BREAST BIOPSY Left 11/20/2009  . CRYOABLATION     cervical cryoablation ~ 2014  . TUBAL LIGATION  1999     A IV Location/Drains/Wounds Patient Lines/Drains/Airways Status   Active Line/Drains/Airways    Name:   Placement date:   Placement time:   Site:   Days:   Peripheral IV 09/19/18 Antecubital   09/19/18    1830    Antecubital   less than 1          Intake/Output Last 24 hours No intake or output data in the 24 hours ending 09/19/18 2055  Labs/Imaging Results for orders placed or performed during the hospital  encounter of 09/19/18 (from the past 48 hour(s))  Basic metabolic panel     Status: Abnormal   Collection Time: 09/19/18  4:06 PM  Result Value Ref Range   Sodium 133 (L) 135 - 145 mmol/L   Potassium 3.7 3.5 - 5.1 mmol/L   Chloride 101 98 - 111 mmol/L   CO2 20 (L) 22 - 32 mmol/L   Glucose, Bld 350 (H) 70 - 99 mg/dL   BUN 8 6 - 20 mg/dL   Creatinine, Ser 0.65 0.44 - 1.00 mg/dL   Calcium 8.6 (L) 8.9 - 10.3 mg/dL   GFR calc non Af Amer >60 >60 mL/min   GFR calc Af Amer >60 >60 mL/min   Anion gap 12 5 - 15    Comment: Performed at [facility], [place] [address] [place], [place] [address]  CBC     Status: Abnormal   Collection Time: 09/19/18  4:06 PM  Result Value Ref Range   WBC 4.7 4.0 - 10.5 K/uL   RBC 4.49 3.87 - 5.11 MIL/uL   Hemoglobin 9.5 (L) 12.0 - 15.0 g/dL   HCT 33.6 (L) 36.0 - 46.0 %   MCV 74.8 (L) 80.0 - 100.0 fL   MCH 21.2 (L) 26.0 - 34.0 pg   MCHC 28.3 (L) 30.0 - 36.0 g/dL   RDW 18.7 (H) 11.5 - 15.5 %   Platelets 325 150 - 400 K/uL   nRBC  0.0 0.0 - 0.2 %    Comment: Performed at [facility], [place] [address] [place], [place] [address]  I-Stat beta hCG blood, ED     Status: None   Collection Time: 09/19/18  4:21 PM  Result Value Ref Range   I-stat hCG, quantitative <5.0 <5 mIU/mL   Comment 3            Comment:   GEST. AGE      CONC.  (mIU/mL)   <=1 WEEK        5 - 50     2 WEEKS       50 - 500     3 WEEKS       100 - 10,000     4 WEEKS     1,000 - 30,000        FEMALE AND NON-PREGNANT FEMALE:     LESS THAN 5 mIU/mL   D-dimer, quantitative     Status: None   Collection Time: 09/19/18  5:28 PM  Result Value Ref Range   D-Dimer, Quant 0.50 0.00 - 0.50 ug/mL-FEU    Comment: (NOTE) At the manufacturer cut-off of 0.50 ug/mL FEU, this assay has been documented to exclude PE with a sensitivity and negative predictive value of 97 to 99%.  At this time, this assay has not been approved by the FDA to exclude DVT/VTE. Results should be correlated with  clinical presentation. Performed at [facility], [place] [address], [place], [place] [address]   Procalcitonin     Status: None   Collection Time: 09/19/18  5:28 PM  Result Value Ref Range   Procalcitonin <0.10 ng/mL    Comment:        Interpretation: PCT (Procalcitonin) <= 0.5 ng/mL: Systemic infection (sepsis) is not likely. Local bacterial infection is possible. (NOTE)       Sepsis PCT Algorithm           Lower Respiratory Tract                                      Infection PCT Algorithm    ----------------------------     ----------------------------         PCT < 0.25 ng/mL                PCT < 0.10 ng/mL         Strongly encourage             Strongly discourage   discontinuation of antibiotics    initiation of antibiotics    ----------------------------     -----------------------------       PCT 0.25 - 0.50 ng/mL            PCT 0.10 - 0.25 ng/mL               OR       >80% decrease in PCT            Discourage initiation of                                            antibiotics      Encourage discontinuation           of antibiotics    ----------------------------     -----------------------------  PCT >= 0.50 ng/mL              PCT 0.26 - 0.50 ng/mL               AND        <80% decrease in PCT             Encourage initiation of                                             antibiotics       Encourage continuation           of antibiotics    ----------------------------     -----------------------------        PCT >= 0.50 ng/mL                  PCT > 0.50 ng/mL               AND         increase in PCT                  Strongly encourage                                      initiation of antibiotics    Strongly encourage escalation           of antibiotics                                     -----------------------------                                           PCT <= 0.25 ng/mL                                                 OR                                         > 80% decrease in PCT                                     Discontinue / Do not initiate                                             antibiotics Performed at [facility], [address] [place], [place] [address]   Lactate dehydrogenase     Status: Abnormal   Collection Time: 09/19/18  5:28 PM  Result Value Ref Range   LDH 232 (H) 98 - 192 U/L    Comment: Performed at [facility], [place] [address] [place], [place] [address]  Ferritin  Status: None   Collection Time: 09/19/18  5:28 PM  Result Value Ref Range   Ferritin 16 11 - 307 ng/mL    Comment: Performed at [facility], [place] [address], [place], [place] [address]  Fibrinogen     Status: Abnormal   Collection Time: 09/19/18  5:28 PM  Result Value Ref Range   Fibrinogen 538 (H) 210 - 475 mg/dL    Comment: Performed at [place] [address] [place], [place] [address]  C-reactive protein     Status: Abnormal   Collection Time: 09/19/18  5:28 PM  Result Value Ref Range   CRP 6.7 (H) <1.0 mg/dL    Comment: Performed at [facility], [place] [address] [place], [place] [address]  Triglycerides     Status: Abnormal   Collection Time: 09/19/18  5:28 PM  Result Value Ref Range   Triglycerides 167 (H) <150 mg/dL    Comment: Performed at [facility] [address], [place], [place] [address]  Lactic acid, plasma     Status: None   Collection Time: 09/19/18  5:29 PM  Result Value Ref Range   Lactic Acid, Venous 1.0 0.5 - 1.9 mmol/L    Comment: Performed at [facility] [address], [place], [place] [address]  Lactic acid, plasma     Status: None   Collection Time: 09/19/18  6:44 PM  Result Value Ref Range   Lactic Acid, Venous 0.9 0.5 - 1.9 mmol/L    Comment: Performed at [place] [address], [place], [place] [address]   Dg Chest Port 1 View  Result Date: 09/19/2018 CLINICAL DATA:  Shortness of breath and cough.  COVID-19 positive EXAM: PORTABLE  CHEST 1 VIEW COMPARISON:  None. FINDINGS: There is patchy airspace opacity in the lung bases with small pleural effusions bilaterally. The lungs elsewhere are clear. Heart is upper normal in size with pulmonary vascularity normal. No adenopathy. No bone lesions. IMPRESSION: Bibasilar airspace opacity consistent with pneumonia with small pleural effusions bilaterally. Lungs elsewhere clear. No adenopathy. Heart upper normal in size. Electronically Signed   By: Lowella Grip III M.D.   On: 09/19/2018 17:21    Pending Labs Unresulted Labs (From admission, onward)    Start     Ordered   09/26/18 0500  Creatinine, serum  (enoxaparin (LOVENOX)    CrCl >/= 30 ml/min)  Weekly,   R    Comments: while on enoxaparin therapy    09/19/18 1955   09/20/18 0500  CBC with Differential/Platelet  Daily,   R     09/19/18 1955   09/20/18 0500  Comprehensive metabolic panel  Daily,   R     09/19/18 1955   09/19/18 1945  HIV antibody (Routine Testing)  Once,   STAT     09/19/18 1955   09/19/18 1945  ABO/Rh  Once,   STAT     09/19/18 1955   09/19/18 1945  Creatinine, serum  (enoxaparin (LOVENOX)    CrCl >/= 30 ml/min)  Once,   STAT    Comments: Baseline for enoxaparin therapy IF NOT ALREADY DRAWN.    09/19/18 1955   09/19/18 1650  Blood Culture (routine x 2)  BLOOD CULTURE X 2,   STAT     09/19/18 1649          Vitals/Pain Today's Vitals   09/19/18 1830 09/19/18 1930 09/19/18 1930 09/19/18 1931  BP:  Pulse:      Resp:      Temp:      TempSrc:      SpO2:      Weight:    78.9 kg  Height:    5\' 5"  (1.651 m)  PainSc: 0-No pain 0-No pain 0-No pain     Isolation Precautions Airborne and Contact precautions  Medications Medications  atorvastatin (LIPITOR) tablet 40 mg (has no administration in time range)  losartan (COZAAR) tablet 50 mg (has no administration in time range)  Dulaglutide SOPN 0.75 mg (has no administration in time range)  levothyroxine (SYNTHROID) tablet 125 mcg (has no  administration in time range)  ferrous sulfate EC tablet 325 mg (has no administration in time range)  enoxaparin (LOVENOX) injection 40 mg (has no administration in time range)  sodium chloride flush (NS) 0.9 % injection 3 mL (has no administration in time range)  sodium chloride flush (NS) 0.9 % injection 3 mL (has no administration in time range)  0.9 %  sodium chloride infusion (has no administration in time range)  acetaminophen (TYLENOL) tablet 650 mg (has no administration in time range)  traMADol (ULTRAM) tablet 50 mg (has no administration in time range)  zolpidem (AMBIEN) tablet 5 mg (has no administration in time range)  docusate sodium (COLACE) capsule 100 mg (has no administration in time range)  insulin aspart (novoLOG) injection 0-20 Units (has no administration in time range)  sodium chloride flush (NS) 0.9 % injection 3 mL (3 mLs Intravenous Given 09/19/18 1830)    Mobility walks Low fall risk   Focused Assessments Pulmonary Assessment Handoff:  Lung sounds: Bilateral Breath Sounds: Clear O2 Device: Nasal Cannula O2 Flow Rate (L/min): 2 L/min      R Recommendations: See Admitting Provider Note  Report given to:   Additional Notes:

## 2018-09-19 NOTE — ED Notes (Signed)
Report called to [place]

## 2018-09-19 NOTE — ED Notes (Signed)
Attempted to call report x 2, RN will return call

## 2018-09-19 NOTE — ED Notes (Signed)
Pt ambulated in room on o2 with steady gait and saturation at 97%.  Pt ambulated in room on room air with steady gait and saturation at 91%.

## 2018-09-20 ENCOUNTER — Encounter (HOSPITAL_COMMUNITY): Payer: Self-pay | Admitting: *Deleted

## 2018-09-20 DIAGNOSIS — J988 Other specified respiratory disorders: Secondary | ICD-10-CM

## 2018-09-20 DIAGNOSIS — J9601 Acute respiratory failure with hypoxia: Secondary | ICD-10-CM

## 2018-09-20 DIAGNOSIS — D500 Iron deficiency anemia secondary to blood loss (chronic): Secondary | ICD-10-CM

## 2018-09-20 LAB — FERRITIN: Ferritin: 13 ng/mL (ref 11–307)

## 2018-09-20 LAB — COMPREHENSIVE METABOLIC PANEL
ALT: 12 U/L (ref 0–44)
AST: 14 U/L — ABNORMAL LOW (ref 15–41)
Albumin: 3.6 g/dL (ref 3.5–5.0)
Alkaline Phosphatase: 45 U/L (ref 38–126)
Anion gap: 9 (ref 5–15)
BUN: 12 mg/dL (ref 6–20)
CO2: 27 mmol/L (ref 22–32)
Calcium: 8.5 mg/dL — ABNORMAL LOW (ref 8.9–10.3)
Chloride: 102 mmol/L (ref 98–111)
Creatinine, Ser: 0.53 mg/dL (ref 0.44–1.00)
GFR calc Af Amer: >60 mL/min (ref >60)
GFR calc non Af Amer: >60 mL/min (ref >60)
Glucose, Bld: 199 mg/dL — ABNORMAL HIGH (ref 70–99)
Potassium: 3.4 mmol/L — ABNORMAL LOW (ref 3.5–5.1)
Sodium: 138 mmol/L (ref 135–145)
Total Bilirubin: 0.3 mg/dL (ref 0.3–1.2)
Total Protein: 7.3 g/dL (ref 6.5–8.1)

## 2018-09-20 LAB — CBC WITH DIFFERENTIAL/PLATELET
Abs Immature Granulocytes: 0.02 10*3/uL (ref 0.00–0.07)
Basophils Absolute: 0 10*3/uL (ref 0.0–0.1)
Basophils Relative: 0 %
Eosinophils Absolute: 0 10*3/uL (ref 0.0–0.5)
Eosinophils Relative: 0 %
HCT: 34.5 % — ABNORMAL LOW (ref 36.0–46.0)
Hemoglobin: 9.6 g/dL — ABNORMAL LOW (ref 12.0–15.0)
Immature Granulocytes: 0 %
Lymphocytes Relative: 41 %
Lymphs Abs: 1.9 10*3/uL (ref 0.7–4.0)
MCH: 21.1 pg — ABNORMAL LOW (ref 26.0–34.0)
MCHC: 27.8 g/dL — ABNORMAL LOW (ref 30.0–36.0)
MCV: 75.8 fL — ABNORMAL LOW (ref 80.0–100.0)
Monocytes Absolute: 0.5 10*3/uL (ref 0.1–1.0)
Monocytes Relative: 11 %
Neutro Abs: 2.1 10*3/uL (ref 1.7–7.7)
Neutrophils Relative %: 48 %
Platelets: 307 10*3/uL (ref 150–400)
RBC: 4.55 MIL/uL (ref 3.87–5.11)
RDW: 18.6 % — ABNORMAL HIGH (ref 11.5–15.5)
WBC: 4.5 10*3/uL (ref 4.0–10.5)
nRBC: 0 % (ref 0.0–0.2)

## 2018-09-20 LAB — HEMOGLOBIN A1C
Hgb A1c MFr Bld: 10.5 % — ABNORMAL HIGH (ref 4.8–5.6)
Mean Plasma Glucose: 254.65 mg/dL

## 2018-09-20 LAB — C-REACTIVE PROTEIN: CRP: 3.5 mg/dL — ABNORMAL HIGH (ref <1.0)

## 2018-09-20 LAB — D-DIMER, QUANTITATIVE: D-Dimer, Quant: 2.03 ug/mL-FEU — ABNORMAL HIGH (ref 0.00–0.50)

## 2018-09-20 LAB — LACTATE DEHYDROGENASE: LDH: 207 U/L — ABNORMAL HIGH (ref 98–192)

## 2018-09-20 LAB — GLUCOSE, CAPILLARY
Glucose-Capillary: 273 mg/dL — ABNORMAL HIGH (ref 70–99)
Glucose-Capillary: 187 mg/dL — ABNORMAL HIGH (ref 70–99)
Glucose-Capillary: 138 mg/dL — ABNORMAL HIGH (ref 70–99)
Glucose-Capillary: 263 mg/dL — ABNORMAL HIGH (ref 70–99)

## 2018-09-20 MED ORDER — VITAMIN C 500 MG PO TABS
500.0000 mg | ORAL_TABLET | Freq: Three times a day (TID) | ORAL | Status: DC
  Administered 2018-09-20 – 2018-09-24 (×13): 500 mg via ORAL
  Filled 2018-09-20 (×12): qty 1

## 2018-09-20 MED ORDER — INSULIN ASPART 100 UNIT/ML ~~LOC~~ SOLN
6.0000 [IU] | Freq: Three times a day (TID) | SUBCUTANEOUS | Status: DC
  Administered 2018-09-20 (×3): 6 [IU] via SUBCUTANEOUS

## 2018-09-20 MED ORDER — INSULIN DETEMIR 100 UNIT/ML ~~LOC~~ SOLN
10.0000 [IU] | Freq: Two times a day (BID) | SUBCUTANEOUS | Status: DC
  Administered 2018-09-20 (×2): 10 [IU] via SUBCUTANEOUS
  Filled 2018-09-20 (×3): qty 0.1

## 2018-09-20 MED ORDER — SODIUM CHLORIDE 0.9 % IV SOLN
100.0000 mg | INTRAVENOUS | Status: AC
  Administered 2018-09-21 – 2018-09-24 (×4): 100 mg via INTRAVENOUS
  Filled 2018-09-20 (×4): qty 20

## 2018-09-20 MED ORDER — SODIUM CHLORIDE 0.9 % IV SOLN
200.0000 mg | Freq: Once | INTRAVENOUS | Status: AC
  Administered 2018-09-20: 200 mg via INTRAVENOUS
  Filled 2018-09-20: qty 40

## 2018-09-20 MED ORDER — DEXAMETHASONE 6 MG PO TABS
6.0000 mg | ORAL_TABLET | Freq: Every day | ORAL | Status: DC
  Administered 2018-09-20 – 2018-09-23 (×4): 6 mg via ORAL
  Filled 2018-09-20 (×4): qty 1

## 2018-09-20 MED ORDER — INSULIN ASPART 100 UNIT/ML ~~LOC~~ SOLN
0.0000 [IU] | Freq: Every day | SUBCUTANEOUS | Status: DC
  Administered 2018-09-20: 3 [IU] via SUBCUTANEOUS
  Administered 2018-09-21: 2 [IU] via SUBCUTANEOUS

## 2018-09-20 MED ORDER — ZINC SULFATE 220 (50 ZN) MG PO CAPS
220.0000 mg | ORAL_CAPSULE | Freq: Every day | ORAL | Status: DC
  Administered 2018-09-20 – 2018-09-24 (×5): 220 mg via ORAL
  Filled 2018-09-20 (×5): qty 1

## 2018-09-20 MED ORDER — INSULIN ASPART 100 UNIT/ML ~~LOC~~ SOLN
0.0000 [IU] | Freq: Three times a day (TID) | SUBCUTANEOUS | Status: DC
  Administered 2018-09-20: 11 [IU] via SUBCUTANEOUS
  Administered 2018-09-20: 4 [IU] via SUBCUTANEOUS
  Administered 2018-09-20: 3 [IU] via SUBCUTANEOUS
  Administered 2018-09-21: 7 [IU] via SUBCUTANEOUS
  Administered 2018-09-21: 15 [IU] via SUBCUTANEOUS
  Administered 2018-09-21: 7 [IU] via SUBCUTANEOUS

## 2018-09-20 NOTE — Progress Notes (Signed)
Pharmacy Antibiotic Note  Valerie Bauer is a 48 y.o. female admitted on 09/19/2018 with cellulitis and COVID 19.  Pharmacy has been consulted for Remdesivir dosing.  CXR shows Bibasilar airspace opacity consistent with pneumonia with small pleural effusions bilaterally Pt requiring 2L [place] ALT 12  Plan: Remdesivir 200mg  IV now then 100mg  IV daily x 4 days Will f/u ALT and pt's clinical condition   Height: 5\' 5"  (165.1 cm) Weight: 174 lb (78.9 kg) IBW/kg (Calculated) : 57  Temp (24hrs), Avg:99.1 F (37.3 C), Min:98.8 F (37.1 C), Max:99.3 F (37.4 C)  Recent Labs  Lab 09/19/18 1606 09/19/18 1729 09/19/18 1844 09/20/18 0845  WBC 4.7  --   --  4.5  CREATININE 0.65  --   --  0.53  LATICACIDVEN  --  1.0 0.9  --     Estimated Creatinine Clearance: 89.3 mL/min (by C-G formula based on SCr of 0.53 mg/dL).    No Known Allergies  Antimicrobials this admission: 8/2 Remdesivir >> 8/6   Thank you for allowing pharmacy to be a part of this patient's care.  Sherlon Handing, PharmD, BCPS [facility] Clinical pharmacist phone [phone] 09/20/2018 10:25 AM

## 2018-09-20 NOTE — ED Provider Notes (Signed)
[facility] EMERGENCY DEPARTMENT Provider Note   CSN: [id number] Arrival date & time: 09/19/18  1542     History   Chief Complaint Chief Complaint  Patient presents with   Shortness of Breath    HPI Valerie Bauer is a 48 y.o. female presenting for evaluation of shortness of breath.  Patient states she first started to have COVID symptoms 10 days ago.  She reports headache, generalized body aches, fevers, shortness of breath.  She tested positive for COVID 1 week ago.  Yesterday she developed acute worsening shortness of breath, which is persisting today.  Patient states it is difficult for her to get around her house due to shortness of breath.  Her fever resolved 2 days ago.  She reports cough.  She denies sore throat, chest pain, nausea, vomiting, bone pain, urinary symptoms, normal bowel movements.  Patient has a history of diabetes and hypertension.  She has a planned gyn surgery for later this month due to fibroids.      HPI  Past Medical History:  Diagnosis Date   Diabetes mellitus without complication ([place])    HSV infection    dx 2011   Hypertension     Patient Active Problem List   Diagnosis Date Noted   Pneumonia due to COVID-19 virus 09/19/2018   COVID-19 virus detected 09/09/2018   HSV infection    Hypothyroidism (acquired) 04/01/2018   Fatigue 08/13/2017   Uterine fibroid 12/16/2016   Iron deficiency anemia 07/19/2016   Hyperlipidemia associated with type 2 diabetes mellitus ([place]) 07/18/2016   Encounter for preventative adult health care exam with abnormal findings 07/17/2016   Uncontrolled type 2 diabetes mellitus without complication, without long-term current use of insulin ([place]) 05/09/2015   Cervical high risk HPV (human papillomavirus) test positive 03/09/2014   Genital herpes 12/26/2009   Mass of multiple sites of left breast 11/14/2009   Obesity (BMI 30-39.9) 12/21/2007   Primary hypertension 12/21/2007    Graves disease 04/17/2006    Past Surgical History:  Procedure Laterality Date   BREAST BIOPSY Left 11/20/2009   CRYOABLATION     cervical cryoablation ~ 2014   TUBAL LIGATION  1999     OB History    Gravida  3   Para  3   Term  3   Preterm      AB      Living  3     SAB      TAB      Ectopic      Multiple      Live Births  3            Home Medications    Prior to Admission medications   Medication Sig Start Date End Date Taking? Authorizing Provider  acetaminophen (TYLENOL) 500 MG tablet Take 500 mg by mouth every 6 (six) hours as needed for mild pain or fever.   Yes [provider]  atorvastatin (LIPITOR) 40 MG tablet Take 1 tablet (40 mg total) by mouth daily. 04/01/18  Yes [place] Bing, DO  Dulaglutide (TRULICITY) 0.24 OX/7.3ZH SOPN Inject 0.75 mg into the skin once a week. Patient taking differently: Inject 0.75 mg into the skin every Sunday.  04/01/18  Yes [place] Bing, DO  ferrous sulfate 325 (65 FE) MG EC tablet Take 1 tablet (325 mg total) by mouth 3 (three) times daily with meals. Patient taking differently: Take 325 mg by mouth daily with breakfast.  07/19/16  Yes [name]  J, DO  ibuprofen (ADVIL,MOTRIN) 800 MG tablet Take 1 tablet (800 mg total) by mouth every 8 (eight) hours as needed for moderate pain. 04/03/18  Yes [place] Bing, DO  levothyroxine (SYNTHROID, LEVOTHROID) 125 MCG tablet Take 1 tablet (125 mcg total) by mouth daily. 04/01/18  Yes [place] Bing, DO  losartan (COZAAR) 50 MG tablet Take 1 tablet (50 mg total) by mouth daily. 04/01/18  Yes [place] Bing, DO  metFORMIN (GLUCOPHAGE-XR) 500 MG 24 hr tablet Take 1 tablet (500 mg total) by mouth 2 (two) times a day. 08/30/18  Yes Mullis, Kiersten P, DO  valACYclovir (VALTREX) 500 MG tablet Take 1 tablet (500 mg total) by mouth daily. Can increase to twice a day for 5 days in the event of a recurrence Patient taking differently: Take 500 mg by mouth See  admin instructions. Take 500 mg by mouth once a day for 5 days in the event of a recurrence- may increase to 500 mg two times a day, as directed 04/30/18  Yes Sloan Leiter, MD  glucose blood (ONETOUCH VERIO) test strip Use up to twice daily testing. E11.9 04/01/18   [place] Bing, DO  ibuprofen (ADVIL,MOTRIN) 600 MG tablet Take 1 tablet (600 mg total) by mouth every 8 (eight) hours as needed. Patient not taking: Reported on 09/19/2018 12/16/16   Carlyle Dolly, MD  Lancet Devices (ONE TOUCH DELICA LANCING DEV) MISC Use up to twice daily testing. E11.9 07/18/16   Zenia Resides, MD  megestrol (MEGACE) 40 MG tablet Take 1 tablet (40 mg total) by mouth 2 (two) times daily. Start with once daily, increase to twice daily if no improvement in bleeding. Patient not taking: Reported on 09/19/2018 04/30/18   Sloan Leiter, MD  norethindrone (AYGESTIN) 5 MG tablet Take 1 tablet (5 mg total) by mouth daily. Patient not taking: Reported on 09/19/2018 06/01/18   Sloan Leiter, MD  [facility] DELICA LANCETS 85I MISC Use up to twice daily testing. E11.9 04/01/18   [place] Bing, DO  terconazole (TERAZOL 3) 0.8 % vaginal cream Place 1 applicator vaginally at bedtime. Patient not taking: Reported on 09/19/2018 05/05/18   Sloan Leiter, MD  traMADol (ULTRAM) 50 MG tablet Take 1 tablet (50 mg total) by mouth every 6 (six) hours as needed. Patient not taking: Reported on 09/19/2018 12/14/16   Lacretia Leigh, MD    Family History Family History  Problem Relation Age of Onset   Breast cancer Mother 60    Social History Social History   Tobacco Use   Smoking status: Never Smoker   Smokeless tobacco: Never Used  Substance Use Topics   Alcohol use: No   Drug use: No     Allergies   Patient has no known allergies.   Review of Systems Review of Systems  Constitutional: Positive for fever.  Respiratory: Positive for cough and shortness of breath.   All other systems reviewed and are  negative.    Physical Exam Updated Vital Signs BP 130/82    Pulse 86    Temp 99.3 F (37.4 C) (Oral)    Resp (!) 25    Ht 5\' 5"  (1.651 m)    Wt 78.9 kg    SpO2 96%    BMI 28.96 kg/m   Physical Exam Vitals signs and nursing note reviewed.  Constitutional:      General: She is not in acute distress.    Appearance: She is well-developed.  HENT:  Head: Normocephalic and atraumatic.  Eyes:     Extraocular Movements: Extraocular movements intact.     Conjunctiva/sclera: Conjunctivae normal.     Pupils: Pupils are equal, round, and reactive to light.  Neck:     Musculoskeletal: Normal range of motion and neck supple.  Cardiovascular:     Rate and Rhythm: Normal rate and regular rhythm.  Pulmonary:     Effort: Pulmonary effort is normal. No respiratory distress.     Breath sounds: Normal breath sounds. No wheezing.     Comments: Speaking in short sentences. On 2 L O2 via [place]. No respiratory distress. When talking, pt becomes tachypneic to 35 Abdominal:     General: There is no distension.     Palpations: Abdomen is soft. There is no mass.     Tenderness: There is no abdominal tenderness. There is no guarding or rebound.  Musculoskeletal: Normal range of motion.  Skin:    General: Skin is warm and dry.     Capillary Refill: Capillary refill takes less than 2 seconds.  Neurological:     Mental Status: She is alert and oriented to person, place, and time.      ED Treatments / Results  Labs (all labs ordered are listed, but only abnormal results are displayed) Labs Reviewed  BASIC METABOLIC PANEL - Abnormal; Notable for the following components:      Result Value   Sodium 133 (*)    CO2 20 (*)    Glucose, Bld 350 (*)    Calcium 8.6 (*)    All other components within normal limits  CBC - Abnormal; Notable for the following components:   Hemoglobin 9.5 (*)    HCT 33.6 (*)    MCV 74.8 (*)    MCH 21.2 (*)    MCHC 28.3 (*)    RDW 18.7 (*)    All other components within  normal limits  LACTATE DEHYDROGENASE - Abnormal; Notable for the following components:   LDH 232 (*)    All other components within normal limits  FIBRINOGEN - Abnormal; Notable for the following components:   Fibrinogen 538 (*)    All other components within normal limits  C-REACTIVE PROTEIN - Abnormal; Notable for the following components:   CRP 6.7 (*)    All other components within normal limits  TRIGLYCERIDES - Abnormal; Notable for the following components:   Triglycerides 167 (*)    All other components within normal limits  CBG MONITORING, ED - Abnormal; Notable for the following components:   Glucose-Capillary 292 (*)    All other components within normal limits  CULTURE, BLOOD (ROUTINE X 2)  CULTURE, BLOOD (ROUTINE X 2)  LACTIC ACID, PLASMA  LACTIC ACID, PLASMA  D-DIMER, QUANTITATIVE (NOT AT [facility])  PROCALCITONIN  FERRITIN  HIV ANTIBODY (ROUTINE TESTING W REFLEX)  CREATININE, SERUM  CBC WITH DIFFERENTIAL/PLATELET  COMPREHENSIVE METABOLIC PANEL  I-STAT BETA HCG BLOOD, ED ([facility], [facility], [facility] ONLY)  ABO/RH    EKG EKG Interpretation  Date/Time:  Saturday September 19 2018 16:01:02 EDT Ventricular Rate:  101 PR Interval:  158 QRS Duration: 80 QT Interval:  364 QTC Calculation: 471 R Axis:   55 Text Interpretation:  Sinus tachycardia Cannot rule out Anterior infarct , age undetermined Abnormal ECG When compared with ECG of 10/13/2006, No significant change was found Confirmed by Delora Fuel ([id number]) on 09/20/2018 12:25:34 AM   Radiology Dg Chest Port 1 View  Result Date: 09/19/2018 CLINICAL DATA:  Shortness of breath and cough.  COVID-19 positive EXAM: PORTABLE CHEST 1 VIEW COMPARISON:  None. FINDINGS: There is patchy airspace opacity in the lung bases with small pleural effusions bilaterally. The lungs elsewhere are clear. Heart is upper normal in size with pulmonary vascularity normal. No adenopathy. No bone lesions. IMPRESSION: Bibasilar airspace opacity consistent with  pneumonia with small pleural effusions bilaterally. Lungs elsewhere clear. No adenopathy. Heart upper normal in size. Electronically Signed   By: Lowella Grip III M.D.   On: 09/19/2018 17:21    Procedures Procedures (including critical care time)  Medications Ordered in ED Medications  atorvastatin (LIPITOR) tablet 40 mg (has no administration in time range)  losartan (COZAAR) tablet 50 mg (has no administration in time range)  Dulaglutide SOPN 0.75 mg (has no administration in time range)  levothyroxine (SYNTHROID) tablet 125 mcg (has no administration in time range)  ferrous sulfate tablet 325 mg (has no administration in time range)  enoxaparin (LOVENOX) injection 40 mg (has no administration in time range)  sodium chloride flush (NS) 0.9 % injection 3 mL (has no administration in time range)  sodium chloride flush (NS) 0.9 % injection 3 mL (has no administration in time range)  0.9 %  sodium chloride infusion (has no administration in time range)  acetaminophen (TYLENOL) tablet 650 mg (has no administration in time range)  traMADol (ULTRAM) tablet 50 mg (has no administration in time range)  zolpidem (AMBIEN) tablet 5 mg (has no administration in time range)  docusate sodium (COLACE) capsule 100 mg (has no administration in time range)  insulin aspart (novoLOG) injection 0-20 Units (11 Units Subcutaneous Given 09/19/18 2216)  sodium chloride flush (NS) 0.9 % injection 3 mL (3 mLs Intravenous Given 09/19/18 1830)     Initial Impression / Assessment and Plan / ED Course  I have reviewed the triage vital signs and the nursing notes.  Pertinent labs & imaging results that were available during my care of the patient were reviewed by me and considered in my medical decision making (see chart for details).     Patient presenting for evaluation of worsening shortness of breath in the setting of COVID infection.  Physical exam shows patient who appears nontoxic.  However, she does  become tachypneic just with speaking.  Upon arrival to the ER, patient was satting at 90% on room air.  2 L of oxygen placed, sats in the upper 90s.  I am concerned about patient's worsening respiratory status.  Will obtain x-ray and labs.  X-ray viewed interpreted by me, shows patchy opacities consistent with pneumonia, which in the setting of a COVID infection is likely a viral pneumonia.  Also showing bilateral pleural effusions, likely contributing to her worsening respiratory status.  Labs show elevation in inflammatory markers including fibrinogen, CRP, LDH.  Hemoglobin stable at 9.5, patient with chronic anemia.  CBG elevated at 350, but no DKA.  Considering worsening respiratory status and changes noted on the x-ray, will call for admission.  Discussed with Dr. Linda Hedges from [facility], pt to be admitted.   Tamla Winkels was evaluated in Emergency Department on 09/20/2018 for the symptoms described in the history of present illness. She was evaluated in the context of the global COVID-19 pandemic, which necessitated consideration that the patient might be at risk for infection with the SARS-CoV-2 virus that causes COVID-19. Institutional protocols and algorithms that pertain to the evaluation of patients at risk for COVID-19 are in a state of rapid change based on information released by regulatory bodies including the CDC  and federal and state organizations. These policies and algorithms were followed during the patient's care in the ED.       Final Clinical Impressions(s) / ED Diagnoses   Final diagnoses:  COVID-19  Pleural effusion    ED Discharge Orders    None       Franchot Heidelberg, PA-C 09/20/18 0059    Fredia Sorrow, MD 09/23/18 [phone]

## 2018-09-20 NOTE — ED Notes (Signed)
Pt being transported by carelink at this time to [facility]. Pt verbalized consent for transport.

## 2018-09-20 NOTE — Progress Notes (Signed)
[facility] PROGRESS NOTE    Progress Note  Valerie Bauer  QBH:[id number] DOB: Jun 27, 1970 DOA: 09/19/2018 PCP: Danna Hefty, DO     Brief Narrative:   Valerie Bauer is an 48 y.o. female past medical history significant for non-insulin diabetes mellitus, Graves' disease who tested positive on 09/09/2018 presents on the day of admission saturating around 90% on room air complaining of dyspnea on exertion and a productive cough chest x-ray revealed bilateral infiltrate with small bilateral pleural effusions.  Assessment/Plan:   Acute respiratory failure with hypoxia (HCC) due to COVID-19 viral infection: She still requiring 2 L of oxygen to keep saturations above 94%.  Procalcitonin less than 0.1. We will start her empirically on IV Remdesivir for 5 days and I oral dexamethasone for 10 days. Check her inflammatory markers today. Cont. flutter valve. We will start her on vitamin C and zinc. Keep the patient peripherally 16 hours a day.  Essential hypertension Blood pressure is fairly controlled. Continue ARB.  Uncontrolled type 2 diabetes mellitus without complication, without long-term current use of insulin ([place]) Discontinue metformin agree with sliding scale and long-acting insulin A1c is pending. Blood glucose is poorly controlled we will try to be more aggressive control of blood glucose.  Hyperlipidemia associated with type 2 diabetes mellitus (HCC) Continue statins.  Microcytic anemia: Likely iron deficiency in a 48 year old fertile female.  MCV is low with RDW high.  Pregnancy test is negative.  Hypothyroidism (acquired) Synthroid.  Pseudohyponatremia: Likely due to elevated blood glucose.   DVT prophylaxis: lovenox Family Communication:none Disposition Plan/Barrier to D/C: Once off oxygen Code Status:     Code Status Orders  (From admission, onward)         Start     Ordered   09/19/18 1946  Full code  Continuous     09/19/18 1955         Code Status History    This patient has a current code status but no historical code status.   Advance Care Planning Activity        IV Access:    Peripheral IV   Procedures and diagnostic studies:   Dg Chest Port 1 View  Result Date: 09/19/2018 CLINICAL DATA:  Shortness of breath and cough.  COVID-19 positive EXAM: PORTABLE CHEST 1 VIEW COMPARISON:  None. FINDINGS: There is patchy airspace opacity in the lung bases with small pleural effusions bilaterally. The lungs elsewhere are clear. Heart is upper normal in size with pulmonary vascularity normal. No adenopathy. No bone lesions. IMPRESSION: Bibasilar airspace opacity consistent with pneumonia with small pleural effusions bilaterally. Lungs elsewhere clear. No adenopathy. Heart upper normal in size. Electronically Signed   By: Lowella Grip III M.D.   On: 09/19/2018 17:21     Medical Consultants:    None.  Anti-Infectives:   None  Subjective:    Valerie Bauer relates she still feels short of breath but is better than yesterday.  Objective:    Vitals:   09/20/18 0200 09/20/18 0215 09/20/18 0230 09/20/18 0305  BP: 133/85 (!) 146/88 136/86 (!) 151/87  Pulse: 77 76 81   Resp: (!) 25 (!) 23 (!) 22 (!) 35  Temp:    98.8 F (37.1 C)  TempSrc:    Oral  SpO2: 98% 98% 97% 96%  Weight:      Height:       SpO2: 96 % O2 Flow Rate (L/min): 2 L/min  No intake or output data in the 24 hours ending  09/20/18 0757 Filed Weights   09/19/18 1931  Weight: 78.9 kg    Exam: General exam: In no acute distress. Respiratory system: Good air movement and clear to auscultation. Cardiovascular system: S1 & S2 heard, RRR. No JVD. Gastrointestinal system: Abdomen is nondistended, soft and nontender.  Central nervous system: Alert and oriented. No focal neurological deficits. Extremities: No pedal edema. Skin: No rashes, lesions or ulcers Psychiatry: Judgement and insight appear normal. Mood & affect appropriate.     Data Reviewed:    Labs: Basic Metabolic Panel: Recent Labs  Lab 09/19/18 1606  NA 133*  K 3.7  CL 101  CO2 20*  GLUCOSE 350*  BUN 8  CREATININE 0.65  CALCIUM 8.6*   GFR Estimated Creatinine Clearance: 89.3 mL/min (by C-G formula based on SCr of 0.65 mg/dL). Liver Function Tests: No results for input(s): AST, ALT, ALKPHOS, BILITOT, PROT, ALBUMIN in the last 168 hours. No results for input(s): LIPASE, AMYLASE in the last 168 hours. No results for input(s): AMMONIA in the last 168 hours. Coagulation profile No results for input(s): INR, PROTIME in the last 168 hours. COVID-19 Labs  Recent Labs    09/19/18 1728  DDIMER 0.50  FERRITIN 16  LDH 232*  CRP 6.7*    Lab Results  Component Value Date   SARSCOV2NAA Detected (A) 09/09/2018    CBC: Recent Labs  Lab 09/19/18 1606  WBC 4.7  HGB 9.5*  HCT 33.6*  MCV 74.8*  PLT 325   Cardiac Enzymes: No results for input(s): CKTOTAL, CKMB, CKMBINDEX, TROPONINI in the last 168 hours. BNP (last 3 results) No results for input(s): PROBNP in the last 8760 hours. CBG: Recent Labs  Lab 09/19/18 2210 09/20/18 0312  GLUCAP 292* 273*   D-Dimer: Recent Labs    09/19/18 1728  DDIMER 0.50   Hgb A1c: No results for input(s): HGBA1C in the last 72 hours. Lipid Profile: Recent Labs    09/19/18 1728  TRIG 167*   Thyroid function studies: No results for input(s): TSH, T4TOTAL, T3FREE, THYROIDAB in the last 72 hours.  Invalid input(s): FREET3 Anemia work up: Recent Labs    09/19/18 1728  FERRITIN 16   Sepsis Labs: Recent Labs  Lab 09/19/18 1606 09/19/18 1728 09/19/18 1729 09/19/18 1844  PROCALCITON  --  <0.10  --   --   WBC 4.7  --   --   --   LATICACIDVEN  --   --  1.0 0.9   Microbiology Recent Results (from the past 240 hour(s))  Blood Culture (routine x 2)     Status: None (Preliminary result)   Collection Time: 09/19/18  4:55 PM   Specimen: BLOOD  Result Value Ref Range Status   Specimen  Description BLOOD LEFT ANTECUBITAL  Final   Special Requests   Final    BOTTLES DRAWN AEROBIC AND ANAEROBIC Blood Culture results may not be optimal due to an excessive volume of blood received in culture bottles   Culture   Final    NO GROWTH < 12 HOURS Performed at [facility], [place] [address], [place], [place] [address]    Report Status PENDING  Incomplete  Blood Culture (routine x 2)     Status: None (Preliminary result)   Collection Time: 09/19/18  5:22 PM   Specimen: BLOOD  Result Value Ref Range Status   Specimen Description BLOOD RIGHT ANTECUBITAL  Final   Special Requests   Final    BOTTLES DRAWN AEROBIC AND ANAEROBIC Blood Culture adequate  volume   Culture   Final    NO GROWTH < 12 HOURS Performed at [facility], [place] [address], [place], [place] [address]    Report Status PENDING  Incomplete     Medications:   . atorvastatin  40 mg Oral Daily  . docusate sodium  100 mg Oral BID  . enoxaparin (LOVENOX) injection  40 mg Subcutaneous Daily  . ferrous sulfate  325 mg Oral Q breakfast  . insulin aspart  0-20 Units Subcutaneous Q4H  . levothyroxine  125 mcg Oral Q0600  . losartan  50 mg Oral Daily  . sodium chloride flush  3 mL Intravenous Q12H   Continuous Infusions: . sodium chloride      LOS: 1 day   Valerie Bauer  [facility]  09/20/2018, 7:57 AM

## 2018-09-21 LAB — COMPREHENSIVE METABOLIC PANEL
ALT: 13 U/L (ref 0–44)
AST: 13 U/L — ABNORMAL LOW (ref 15–41)
Albumin: 3.4 g/dL — ABNORMAL LOW (ref 3.5–5.0)
Alkaline Phosphatase: 45 U/L (ref 38–126)
Anion gap: 11 (ref 5–15)
BUN: 15 mg/dL (ref 6–20)
CO2: 23 mmol/L (ref 22–32)
Calcium: 8.6 mg/dL — ABNORMAL LOW (ref 8.9–10.3)
Chloride: 103 mmol/L (ref 98–111)
Creatinine, Ser: 0.61 mg/dL (ref 0.44–1.00)
GFR calc Af Amer: >60 mL/min (ref >60)
GFR calc non Af Amer: >60 mL/min (ref >60)
Glucose, Bld: 230 mg/dL — ABNORMAL HIGH (ref 70–99)
Potassium: 3.5 mmol/L (ref 3.5–5.1)
Sodium: 137 mmol/L (ref 135–145)
Total Bilirubin: 0.3 mg/dL (ref 0.3–1.2)
Total Protein: 6.9 g/dL (ref 6.5–8.1)

## 2018-09-21 LAB — CBC WITH DIFFERENTIAL/PLATELET
Abs Immature Granulocytes: 0.08 10*3/uL — ABNORMAL HIGH (ref 0.00–0.07)
Basophils Absolute: 0 10*3/uL (ref 0.0–0.1)
Basophils Relative: 0 %
Eosinophils Absolute: 0 10*3/uL (ref 0.0–0.5)
Eosinophils Relative: 0 %
HCT: 33.7 % — ABNORMAL LOW (ref 36.0–46.0)
Hemoglobin: 9.2 g/dL — ABNORMAL LOW (ref 12.0–15.0)
Immature Granulocytes: 1 %
Lymphocytes Relative: 26 %
Lymphs Abs: 1.9 10*3/uL (ref 0.7–4.0)
MCH: 20.7 pg — ABNORMAL LOW (ref 26.0–34.0)
MCHC: 27.3 g/dL — ABNORMAL LOW (ref 30.0–36.0)
MCV: 75.9 fL — ABNORMAL LOW (ref 80.0–100.0)
Monocytes Absolute: 0.7 10*3/uL (ref 0.1–1.0)
Monocytes Relative: 9 %
Neutro Abs: 4.7 10*3/uL (ref 1.7–7.7)
Neutrophils Relative %: 64 %
Platelets: 313 10*3/uL (ref 150–400)
RBC: 4.44 MIL/uL (ref 3.87–5.11)
RDW: 18.6 % — ABNORMAL HIGH (ref 11.5–15.5)
WBC: 7.4 10*3/uL (ref 4.0–10.5)
nRBC: 0 % (ref 0.0–0.2)

## 2018-09-21 LAB — C-REACTIVE PROTEIN: CRP: 2.1 mg/dL — ABNORMAL HIGH (ref <1.0)

## 2018-09-21 LAB — GLUCOSE, CAPILLARY
Glucose-Capillary: 226 mg/dL — ABNORMAL HIGH (ref 70–99)
Glucose-Capillary: 221 mg/dL — ABNORMAL HIGH (ref 70–99)
Glucose-Capillary: 315 mg/dL — ABNORMAL HIGH (ref 70–99)
Glucose-Capillary: 245 mg/dL — ABNORMAL HIGH (ref 70–99)

## 2018-09-21 LAB — D-DIMER, QUANTITATIVE: D-Dimer, Quant: 2.36 ug/mL-FEU — ABNORMAL HIGH (ref 0.00–0.50)

## 2018-09-21 LAB — FERRITIN: Ferritin: 17 ng/mL (ref 11–307)

## 2018-09-21 LAB — LACTATE DEHYDROGENASE: LDH: 199 U/L — ABNORMAL HIGH (ref 98–192)

## 2018-09-21 MED ORDER — INSULIN DETEMIR 100 UNIT/ML ~~LOC~~ SOLN
15.0000 [IU] | Freq: Two times a day (BID) | SUBCUTANEOUS | Status: DC
  Administered 2018-09-21 – 2018-09-24 (×7): 15 [IU] via SUBCUTANEOUS
  Filled 2018-09-21 (×8): qty 0.15

## 2018-09-21 MED ORDER — POTASSIUM CHLORIDE CRYS ER 20 MEQ PO TBCR
40.0000 meq | EXTENDED_RELEASE_TABLET | Freq: Once | ORAL | Status: AC
  Administered 2018-09-21: 40 meq via ORAL
  Filled 2018-09-21: qty 2

## 2018-09-21 MED ORDER — INSULIN ASPART 100 UNIT/ML ~~LOC~~ SOLN
10.0000 [IU] | Freq: Three times a day (TID) | SUBCUTANEOUS | Status: DC
  Administered 2018-09-21 (×3): 10 [IU] via SUBCUTANEOUS

## 2018-09-21 NOTE — Progress Notes (Signed)
Inpatient Diabetes Program Recommendations  AACE/ADA: New Consensus Statement on Inpatient Glycemic Control (2015)  Target Ranges:  Prepandial:   less than 140 mg/dL      Peak postprandial:   less than 180 mg/dL (1-2 hours)      Critically ill patients:  140 - 180 mg/dL   Lab Results  Component Value Date   GLUCAP 226 (H) 09/21/2018   HGBA1C 10.5 (H) 09/20/2018    Review of Glycemic Control  Diabetes history:  DM 2 Outpatient Diabetes medications: Metformin 959 mg bid, Trulicity 7.47 mg QSunday Current orders for Inpatient glycemic control: Levemir 15 units bid, Novolog 0-20 units tid, Novolog 0-5 units qhs, Novolog 10 units tid meal coverage  Decadron 6 mg Daily  Inpatient Diabetes Program Recommendations:    A1c 10.5%. Patient on Metformin 185 mg bid and Trulicity injection QSunday.  Patient would benefit from insulin at time of d/c if agreeable. Will follow patient for appropriate time for education and discussion on glucose control and insulin.  Thanks,  Tama Headings RN, MSN, BC-ADM Inpatient Diabetes Coordinator Team Pager [phone] (8a-5p)

## 2018-09-21 NOTE — Progress Notes (Signed)
[facility] PROGRESS NOTE    Progress Note  Jaylie Neaves  NWG:[id number] DOB: 03/05/1970 DOA: 09/19/2018 PCP: Danna Hefty, DO     Brief Narrative:   Juliya Magill is an 48 y.o. female past medical history significant for non-insulin diabetes mellitus, Graves' disease who tested positive on 09/09/2018 presents on the day of admission saturating around 90% on room air complaining of dyspnea on exertion and a productive cough chest x-ray revealed bilateral infiltrate with small bilateral pleural effusions.  Assessment/Plan:   Acute respiratory failure with hypoxia (HCC) due to COVID-19 viral infection: Continues to require 2 L of oxygen to keep saturations above 94%. Continue IV Remdesivir for 5 days, oral dexamethasone for 10 days. Her inflammatory markers are improving today. Continue to use flutter valve. Try to keep the patient prone for at least 16 hours a day of the case she cannot tolerated 2 to 3 hours at a time. Continue oral vitamin C and zinc.  Essential hypertension Pressure has been well controlled continue ARB.  Uncontrolled type 2 diabetes mellitus without complication, without long-term current use of insulin (HCC) Hemoglobin A1c is 10.5, increase long-acting insulin and meal coverage.  Continue CBGs before meals and at bedtime.  Hyperlipidemia associated with type 2 diabetes mellitus (HCC) Continue statins.  Microcytic anemia: Likely iron deficiency in a 48 year old fertile female.  MCV is low with RDW high.  Pregnancy test is negative.  Hypothyroidism (acquired) Synthroid.  Pseudohyponatremia: Resolved, likely due to elevated blood glucose.  Hypokalemia: Repleted orally now improved.  Try to keep above 4.   DVT prophylaxis: lovenox Family Communication:none Disposition Plan/Barrier to D/C: Once off oxygen Code Status:     Code Status Orders  (From admission, onward)         Start     Ordered   09/19/18 1946  Full code   Continuous     09/19/18 1955        Code Status History    This patient has a current code status but no historical code status.   Advance Care Planning Activity        IV Access:    Peripheral IV   Procedures and diagnostic studies:   Dg Chest Port 1 View  Result Date: 09/19/2018 CLINICAL DATA:  Shortness of breath and cough.  COVID-19 positive EXAM: PORTABLE CHEST 1 VIEW COMPARISON:  None. FINDINGS: There is patchy airspace opacity in the lung bases with small pleural effusions bilaterally. The lungs elsewhere are clear. Heart is upper normal in size with pulmonary vascularity normal. No adenopathy. No bone lesions. IMPRESSION: Bibasilar airspace opacity consistent with pneumonia with small pleural effusions bilaterally. Lungs elsewhere clear. No adenopathy. Heart upper normal in size. Electronically Signed   By: Lowella Grip III M.D.   On: 09/19/2018 17:21     Medical Consultants:    None.  Anti-Infectives:   None  Subjective:    [name] relates her breathing is better than yesterday, she feels she has more energy.  Objective:    Vitals:   09/20/18 0215 09/20/18 0230 09/20/18 0305 09/20/18 0800  BP: (!) 146/88 136/86 (!) 151/87 126/86  Pulse: 76 81  79  Resp: (!) 23 (!) 22 (!) 35 (!) 21  Temp:   98.8 F (37.1 C)   TempSrc:   Oral   SpO2: 98% 97% 96% 95%  Weight:      Height:       SpO2: 95 % O2 Flow Rate (L/min): 2 L/min  No intake or output data in the 24 hours ending 09/21/18 0726 Filed Weights   09/19/18 1931  Weight: 78.9 kg    Exam: General exam: In no acute distress. Respiratory system: Good air movement and use crackles at bases. Cardiovascular system: S1 & S2 heard, RRR. No JVD. Gastrointestinal system: Abdomen is nondistended, soft and nontender.  Central nervous system: Alert and oriented. No focal neurological deficits. Extremities: No pedal edema. Skin: No rashes, lesions or ulcers Psychiatry: Judgement and insight  appear normal. Mood & affect appropriate.    Data Reviewed:    Labs: Basic Metabolic Panel: Recent Labs  Lab 09/19/18 1606 09/20/18 0845 09/21/18 0200  NA 133* 138 137  K 3.7 3.4* 3.5  CL 101 102 103  CO2 20* 27 23  GLUCOSE 350* 199* 230*  BUN 8 12 15   CREATININE 0.65 0.53 0.61  CALCIUM 8.6* 8.5* 8.6*   GFR Estimated Creatinine Clearance: 89.3 mL/min (by C-G formula based on SCr of 0.61 mg/dL). Liver Function Tests: Recent Labs  Lab 09/20/18 0845 09/21/18 0200  AST 14* 13*  ALT 12 13  ALKPHOS 45 45  BILITOT 0.3 0.3  PROT 7.3 6.9  ALBUMIN 3.6 3.4*   No results for input(s): LIPASE, AMYLASE in the last 168 hours. No results for input(s): AMMONIA in the last 168 hours. Coagulation profile No results for input(s): INR, PROTIME in the last 168 hours. COVID-19 Labs  Recent Labs    09/19/18 1728 09/20/18 0845 09/21/18 0200  DDIMER 0.50 2.03* 2.36*  FERRITIN 16 13 17   LDH 232* 207* 199*  CRP 6.7* 3.5* 2.1*    Lab Results  Component Value Date   SARSCOV2NAA Detected (A) 09/09/2018    CBC: Recent Labs  Lab 09/19/18 1606 09/20/18 0845 09/21/18 0200  WBC 4.7 4.5 7.4  NEUTROABS  --  2.1 PENDING  HGB 9.5* 9.6* 9.2*  HCT 33.6* 34.5* 33.7*  MCV 74.8* 75.8* 75.9*  PLT 325 307 313   Cardiac Enzymes: No results for input(s): CKTOTAL, CKMB, CKMBINDEX, TROPONINI in the last 168 hours. BNP (last 3 results) No results for input(s): PROBNP in the last 8760 hours. CBG: Recent Labs  Lab 09/19/18 2210 09/20/18 0312 09/20/18 0758 09/20/18 1207 09/20/18 1649  GLUCAP 292* 273* 187* 138* 263*   D-Dimer: Recent Labs    09/20/18 0845 09/21/18 0200  DDIMER 2.03* 2.36*   Hgb A1c: Recent Labs    09/20/18 0845  HGBA1C 10.5*   Lipid Profile: Recent Labs    09/19/18 1728  TRIG 167*   Thyroid function studies: No results for input(s): TSH, T4TOTAL, T3FREE, THYROIDAB in the last 72 hours.  Invalid input(s): FREET3 Anemia work up: Recent Labs     09/20/18 0845 09/21/18 0200  FERRITIN 13 17   Sepsis Labs: Recent Labs  Lab 09/19/18 1606 09/19/18 1728 09/19/18 1729 09/19/18 1844 09/20/18 0845 09/21/18 0200  PROCALCITON  --  <0.10  --   --   --   --   WBC 4.7  --   --   --  4.5 7.4  LATICACIDVEN  --   --  1.0 0.9  --   --    Microbiology Recent Results (from the past 240 hour(s))  Blood Culture (routine x 2)     Status: None (Preliminary result)   Collection Time: 09/19/18  4:55 PM   Specimen: BLOOD  Result Value Ref Range Status   Specimen Description BLOOD LEFT ANTECUBITAL  Final   Special Requests   Final  BOTTLES DRAWN AEROBIC AND ANAEROBIC Blood Culture results may not be optimal due to an excessive volume of blood received in culture bottles   Culture   Final    NO GROWTH < 12 HOURS Performed at [facility] [address], [place], [place] [address]    Report Status PENDING  Incomplete  Blood Culture (routine x 2)     Status: None (Preliminary result)   Collection Time: 09/19/18  5:22 PM   Specimen: BLOOD  Result Value Ref Range Status   Specimen Description BLOOD RIGHT ANTECUBITAL  Final   Special Requests   Final    BOTTLES DRAWN AEROBIC AND ANAEROBIC Blood Culture adequate volume   Culture   Final    NO GROWTH < 12 HOURS Performed at [facility], [place] [address], [place],  [address]    Report Status PENDING  Incomplete     Medications:   . atorvastatin  40 mg Oral Daily  . dexamethasone  6 mg Oral Daily  . docusate sodium  100 mg Oral BID  . enoxaparin (LOVENOX) injection  40 mg Subcutaneous Daily  . ferrous sulfate  325 mg Oral Q breakfast  . insulin aspart  0-20 Units Subcutaneous TID WC  . insulin aspart  0-5 Units Subcutaneous QHS  . insulin aspart  6 Units Subcutaneous TID WC  . insulin detemir  10 Units Subcutaneous BID  . levothyroxine  125 mcg Oral Q0600  . losartan  50 mg Oral Daily  . sodium chloride flush  3 mL Intravenous Q12H  . vitamin C  500 mg Oral TID   . zinc sulfate  220 mg Oral Daily   Continuous Infusions: . sodium chloride    . remdesivir 100 mg in NS 250 mL      LOS: 2 days   Charlynne Cousins  [facility]  09/21/2018, 7:26 AM

## 2018-09-22 LAB — CBC WITH DIFFERENTIAL/PLATELET
Abs Immature Granulocytes: 0.12 10*3/uL — ABNORMAL HIGH (ref 0.00–0.07)
Basophils Absolute: 0 10*3/uL (ref 0.0–0.1)
Basophils Relative: 0 %
Eosinophils Absolute: 0 10*3/uL (ref 0.0–0.5)
Eosinophils Relative: 0 %
HCT: 33.9 % — ABNORMAL LOW (ref 36.0–46.0)
Hemoglobin: 9.2 g/dL — ABNORMAL LOW (ref 12.0–15.0)
Immature Granulocytes: 1 %
Lymphocytes Relative: 29 %
Lymphs Abs: 2.8 10*3/uL (ref 0.7–4.0)
MCH: 20.8 pg — ABNORMAL LOW (ref 26.0–34.0)
MCHC: 27.1 g/dL — ABNORMAL LOW (ref 30.0–36.0)
MCV: 76.7 fL — ABNORMAL LOW (ref 80.0–100.0)
Monocytes Absolute: 0.9 10*3/uL (ref 0.1–1.0)
Monocytes Relative: 9 %
Neutro Abs: 5.8 10*3/uL (ref 1.7–7.7)
Neutrophils Relative %: 61 %
Platelets: 349 10*3/uL (ref 150–400)
RBC: 4.42 MIL/uL (ref 3.87–5.11)
RDW: 18.8 % — ABNORMAL HIGH (ref 11.5–15.5)
WBC: 9.5 10*3/uL (ref 4.0–10.5)
nRBC: 0.3 % — ABNORMAL HIGH (ref 0.0–0.2)

## 2018-09-22 LAB — COMPREHENSIVE METABOLIC PANEL
ALT: 12 U/L (ref 0–44)
AST: 12 U/L — ABNORMAL LOW (ref 15–41)
Albumin: 3.4 g/dL — ABNORMAL LOW (ref 3.5–5.0)
Alkaline Phosphatase: 45 U/L (ref 38–126)
Anion gap: 8 (ref 5–15)
BUN: 16 mg/dL (ref 6–20)
CO2: 26 mmol/L (ref 22–32)
Calcium: 8.6 mg/dL — ABNORMAL LOW (ref 8.9–10.3)
Chloride: 104 mmol/L (ref 98–111)
Creatinine, Ser: 0.59 mg/dL (ref 0.44–1.00)
GFR calc Af Amer: >60 mL/min (ref >60)
GFR calc non Af Amer: >60 mL/min (ref >60)
Glucose, Bld: 134 mg/dL — ABNORMAL HIGH (ref 70–99)
Potassium: 3.6 mmol/L (ref 3.5–5.1)
Sodium: 138 mmol/L (ref 135–145)
Total Bilirubin: 0.2 mg/dL — ABNORMAL LOW (ref 0.3–1.2)
Total Protein: 6.9 g/dL (ref 6.5–8.1)

## 2018-09-22 LAB — GLUCOSE, CAPILLARY
Glucose-Capillary: 90 mg/dL (ref 70–99)
Glucose-Capillary: 128 mg/dL — ABNORMAL HIGH (ref 70–99)
Glucose-Capillary: 267 mg/dL — ABNORMAL HIGH (ref 70–99)
Glucose-Capillary: 372 mg/dL — ABNORMAL HIGH (ref 70–99)

## 2018-09-22 LAB — D-DIMER, QUANTITATIVE: D-Dimer, Quant: 1.91 ug/mL-FEU — ABNORMAL HIGH (ref 0.00–0.50)

## 2018-09-22 LAB — LACTATE DEHYDROGENASE: LDH: 207 U/L — ABNORMAL HIGH (ref 98–192)

## 2018-09-22 LAB — FERRITIN: Ferritin: 18 ng/mL (ref 11–307)

## 2018-09-22 LAB — C-REACTIVE PROTEIN: CRP: 1.1 mg/dL — ABNORMAL HIGH (ref <1.0)

## 2018-09-22 MED ORDER — INSULIN ASPART 100 UNIT/ML ~~LOC~~ SOLN
0.0000 [IU] | Freq: Every day | SUBCUTANEOUS | Status: DC
  Administered 2018-09-22: 5 [IU] via SUBCUTANEOUS

## 2018-09-22 MED ORDER — POTASSIUM CHLORIDE CRYS ER 20 MEQ PO TBCR
40.0000 meq | EXTENDED_RELEASE_TABLET | Freq: Three times a day (TID) | ORAL | Status: AC
  Administered 2018-09-22 (×3): 40 meq via ORAL
  Filled 2018-09-22 (×3): qty 2

## 2018-09-22 MED ORDER — INSULIN ASPART 100 UNIT/ML ~~LOC~~ SOLN
0.0000 [IU] | Freq: Three times a day (TID) | SUBCUTANEOUS | Status: DC
  Administered 2018-09-22: 3 [IU] via SUBCUTANEOUS
  Administered 2018-09-22: 11 [IU] via SUBCUTANEOUS

## 2018-09-22 MED ORDER — INSULIN ASPART 100 UNIT/ML ~~LOC~~ SOLN
12.0000 [IU] | Freq: Three times a day (TID) | SUBCUTANEOUS | Status: DC
  Administered 2018-09-22 – 2018-09-23 (×4): 12 [IU] via SUBCUTANEOUS

## 2018-09-22 NOTE — Progress Notes (Signed)
[facility] PROGRESS NOTE    Progress Note  Valerie Bauer  EQA:[id number] DOB: 03-26-1970 DOA: 09/19/2018 PCP: Danna Hefty, DO     Brief Narrative:   Valerie Bauer is an 48 y.o. female past medical history significant for non-insulin diabetes mellitus, Graves' disease who tested positive on 09/09/2018 presents on the day of admission saturating around 90% on room air complaining of dyspnea on exertion and a productive cough chest x-ray revealed bilateral infiltrate with small bilateral pleural effusions.  09/20/2018 IV Remdesivir 09/20/2018 oral dexamethasone  Assessment/Plan:   Acute respiratory failure with hypoxia (HCC) due to COVID-19 viral infection: Continue IV Remdesivir for 5 days, oral dexamethasone for 10 days. Her inflammatory markers are improved today, continues flutter valve. Saturations have remained greater than 92% on room air. Continue to use flutter valve. Patient is doing a great job pronating, continue to prone for at least 16 hours a day. Continue oral vitamin C and zinc.  Essential hypertension Blood pressure is fairly stable continue ARB.  Uncontrolled type 2 diabetes mellitus without complication, without long-term current use of insulin (HCC) Hemoglobin A1c is 10.5, blood glucose is well controlled, continue current regimen.  Hyperlipidemia associated with type 2 diabetes mellitus (HCC) Continue statins.  Microcytic anemia: MCV is low RDW is high, ferritin is unreliable, likely iron deficiency in a menstruating fertile female.  Hypothyroidism (acquired) Continue Synthroid  Pseudohyponatremia: Resolved, likely due to elevated blood glucose.  Hypokalemia: Try to keep her before, continue to replete orally.   DVT prophylaxis: lovenox Family Communication:none Disposition Plan/Barrier to D/C: When she complete her course of IV Remdesivir. Code Status:     Code Status Orders  (From admission, onward)         Start      Ordered   09/19/18 1946  Full code  Continuous     09/19/18 1955        Code Status History    This patient has a current code status but no historical code status.   Advance Care Planning Activity        IV Access:    Peripheral IV   Procedures and diagnostic studies:   No results found.   Medical Consultants:    None.  Anti-Infectives:   None  Subjective:    Valerie Bauer she relates she has more energy today had a good night sleep continues to tolerate her diet.  Objective:    Vitals:   09/20/18 0800 09/21/18 0810 09/21/18 1600 09/21/18 1942  BP: 126/86 (!) 157/91 (!) 141/73 135/85  Pulse: 79 78 87 84  Resp: (!) 21 (!) 21 (!) 22 (!) 28  Temp:  97.9 F (36.6 C) 99.1 F (37.3 C) 97.9 F (36.6 C)  TempSrc:    Oral  SpO2: 95% 98% 93% 96%  Weight:      Height:       SpO2: 96 % O2 Flow Rate (L/min): 2 L/min   Intake/Output Summary (Last 24 hours) at 09/22/2018 0731 Last data filed at 09/21/2018 1700 Gross per 24 hour  Intake 250.92 ml  Output --  Net 250.92 ml   Filed Weights   09/19/18 1931  Weight: 78.9 kg    Exam: General exam: In no acute distress. Respiratory system: Good air movement and crackles at bases. Cardiovascular system: S1 & S2 heard, RRR. No JVD. Gastrointestinal system: Abdomen is nondistended, soft and nontender.  Central nervous system: Alert and oriented. No focal neurological deficits. Extremities: No pedal edema. Skin: No  rashes, lesions or ulcers Psychiatry: Judgement and insight appear normal. Mood & affect appropriate.    Data Reviewed:    Labs: Basic Metabolic Panel: Recent Labs  Lab 09/19/18 1606 09/20/18 0845 09/21/18 0200 09/22/18 0200  NA 133* 138 137 138  K 3.7 3.4* 3.5 3.6  CL 101 102 103 104  CO2 20* 27 23 26   GLUCOSE 350* 199* 230* 134*  BUN 8 12 15 16   CREATININE 0.65 0.53 0.61 0.59  CALCIUM 8.6* 8.5* 8.6* 8.6*   GFR Estimated Creatinine Clearance: 89.3 mL/min (by C-G formula based  on SCr of 0.59 mg/dL). Liver Function Tests: Recent Labs  Lab 09/20/18 0845 09/21/18 0200 09/22/18 0200  AST 14* 13* 12*  ALT 12 13 12   ALKPHOS 45 45 45  BILITOT 0.3 0.3 0.2*  PROT 7.3 6.9 6.9  ALBUMIN 3.6 3.4* 3.4*   No results for input(s): LIPASE, AMYLASE in the last 168 hours. No results for input(s): AMMONIA in the last 168 hours. Coagulation profile No results for input(s): INR, PROTIME in the last 168 hours. COVID-19 Labs  Recent Labs    09/19/18 1728 09/20/18 0845 09/21/18 0200 09/22/18 0200  DDIMER 0.50 2.03* 2.36* 1.91*  FERRITIN 16 13 17   --   LDH 232* 207* 199* 207*  CRP 6.7* 3.5* 2.1*  --     Lab Results  Component Value Date   SARSCOV2NAA Detected (A) 09/09/2018    CBC: Recent Labs  Lab 09/19/18 1606 09/20/18 0845 09/21/18 0200 09/22/18 0200  WBC 4.7 4.5 7.4 9.5  NEUTROABS  --  2.1 4.7 5.8  HGB 9.5* 9.6* 9.2* 9.2*  HCT 33.6* 34.5* 33.7* 33.9*  MCV 74.8* 75.8* 75.9* 76.7*  PLT 325 307 313 349   Cardiac Enzymes: No results for input(s): CKTOTAL, CKMB, CKMBINDEX, TROPONINI in the last 168 hours. BNP (last 3 results) No results for input(s): PROBNP in the last 8760 hours. CBG: Recent Labs  Lab 09/20/18 1649 09/21/18 0812 09/21/18 1154 09/21/18 1558 09/21/18 2110  GLUCAP 263* 226* 221* 315* 245*   D-Dimer: Recent Labs    09/21/18 0200 09/22/18 0200  DDIMER 2.36* 1.91*   Hgb A1c: Recent Labs    09/20/18 0845  HGBA1C 10.5*   Lipid Profile: Recent Labs    09/19/18 1728  TRIG 167*   Thyroid function studies: No results for input(s): TSH, T4TOTAL, T3FREE, THYROIDAB in the last 72 hours.  Invalid input(s): FREET3 Anemia work up: Recent Labs    09/20/18 0845 09/21/18 0200  FERRITIN 13 17   Sepsis Labs: Recent Labs  Lab 09/19/18 1606 09/19/18 1728 09/19/18 1729 09/19/18 1844 09/20/18 0845 09/21/18 0200 09/22/18 0200  PROCALCITON  --  <0.10  --   --   --   --   --   WBC 4.7  --   --   --  4.5 7.4 9.5    LATICACIDVEN  --   --  1.0 0.9  --   --   --    Microbiology Recent Results (from the past 240 hour(s))  Blood Culture (routine x 2)     Status: None (Preliminary result)   Collection Time: 09/19/18  4:55 PM   Specimen: BLOOD  Result Value Ref Range Status   Specimen Description BLOOD LEFT ANTECUBITAL  Final   Special Requests   Final    BOTTLES DRAWN AEROBIC AND ANAEROBIC Blood Culture results may not be optimal due to an excessive volume of blood received in culture bottles   Culture   Final  NO GROWTH 2 DAYS Performed at [facility], [place] [address], [place], [place] [address]    Report Status PENDING  Incomplete  Blood Culture (routine x 2)     Status: None (Preliminary result)   Collection Time: 09/19/18  5:22 PM   Specimen: BLOOD  Result Value Ref Range Status   Specimen Description BLOOD RIGHT ANTECUBITAL  Final   Special Requests   Final    BOTTLES DRAWN AEROBIC AND ANAEROBIC Blood Culture adequate volume   Culture   Final    NO GROWTH 2 DAYS Performed at [facility], [place] [address], [place], [place] [address]    Report Status PENDING  Incomplete     Medications:    atorvastatin  40 mg Oral Daily   dexamethasone  6 mg Oral Daily   docusate sodium  100 mg Oral BID   enoxaparin (LOVENOX) injection  40 mg Subcutaneous Daily   ferrous sulfate  325 mg Oral Q breakfast   insulin aspart  0-20 Units Subcutaneous TID WC   insulin aspart  0-5 Units Subcutaneous QHS   insulin aspart  10 Units Subcutaneous TID WC   insulin detemir  15 Units Subcutaneous BID   levothyroxine  125 mcg Oral Q0600   losartan  50 mg Oral Daily   sodium chloride flush  3 mL Intravenous Q12H   vitamin C  500 mg Oral TID   zinc sulfate  220 mg Oral Daily   Continuous Infusions:  sodium chloride     remdesivir 100 mg in NS 250 mL 100 mg (09/21/18 1211)    LOS: 3 days   Valerie Bauer  [facility]  09/22/2018, 7:31 AM

## 2018-09-23 LAB — COMPREHENSIVE METABOLIC PANEL
ALT: 12 U/L (ref 0–44)
AST: 13 U/L — ABNORMAL LOW (ref 15–41)
Albumin: 3.5 g/dL (ref 3.5–5.0)
Alkaline Phosphatase: 45 U/L (ref 38–126)
Anion gap: 5 (ref 5–15)
BUN: 15 mg/dL (ref 6–20)
CO2: 25 mmol/L (ref 22–32)
Calcium: 8.4 mg/dL — ABNORMAL LOW (ref 8.9–10.3)
Chloride: 108 mmol/L (ref 98–111)
Creatinine, Ser: 0.58 mg/dL (ref 0.44–1.00)
GFR calc Af Amer: >60 mL/min (ref >60)
GFR calc non Af Amer: >60 mL/min (ref >60)
Glucose, Bld: 119 mg/dL — ABNORMAL HIGH (ref 70–99)
Potassium: 4.3 mmol/L (ref 3.5–5.1)
Sodium: 138 mmol/L (ref 135–145)
Total Bilirubin: <0.1 mg/dL — ABNORMAL LOW (ref 0.3–1.2)
Total Protein: 6.8 g/dL (ref 6.5–8.1)

## 2018-09-23 LAB — CBC WITH DIFFERENTIAL/PLATELET
Abs Immature Granulocytes: 0.12 10*3/uL — ABNORMAL HIGH (ref 0.00–0.07)
Basophils Absolute: 0 10*3/uL (ref 0.0–0.1)
Basophils Relative: 0 %
Eosinophils Absolute: 0 10*3/uL (ref 0.0–0.5)
Eosinophils Relative: 0 %
HCT: 35 % — ABNORMAL LOW (ref 36.0–46.0)
Hemoglobin: 9.5 g/dL — ABNORMAL LOW (ref 12.0–15.0)
Immature Granulocytes: 1 %
Lymphocytes Relative: 32 %
Lymphs Abs: 2.9 10*3/uL (ref 0.7–4.0)
MCH: 20.9 pg — ABNORMAL LOW (ref 26.0–34.0)
MCHC: 27.1 g/dL — ABNORMAL LOW (ref 30.0–36.0)
MCV: 77.1 fL — ABNORMAL LOW (ref 80.0–100.0)
Monocytes Absolute: 0.9 10*3/uL (ref 0.1–1.0)
Monocytes Relative: 10 %
Neutro Abs: 5.3 10*3/uL (ref 1.7–7.7)
Neutrophils Relative %: 57 %
Platelets: 356 10*3/uL (ref 150–400)
RBC: 4.54 MIL/uL (ref 3.87–5.11)
RDW: 19.4 % — ABNORMAL HIGH (ref 11.5–15.5)
WBC: 9.3 10*3/uL (ref 4.0–10.5)
nRBC: 0 % (ref 0.0–0.2)

## 2018-09-23 LAB — GLUCOSE, CAPILLARY
Glucose-Capillary: 108 mg/dL — ABNORMAL HIGH (ref 70–99)
Glucose-Capillary: 111 mg/dL — ABNORMAL HIGH (ref 70–99)
Glucose-Capillary: 183 mg/dL — ABNORMAL HIGH (ref 70–99)
Glucose-Capillary: 191 mg/dL — ABNORMAL HIGH (ref 70–99)
Glucose-Capillary: 258 mg/dL — ABNORMAL HIGH (ref 70–99)
Glucose-Capillary: 287 mg/dL — ABNORMAL HIGH (ref 70–99)
Glucose-Capillary: 87 mg/dL (ref 70–99)

## 2018-09-23 LAB — C-REACTIVE PROTEIN: CRP: 1 mg/dL — ABNORMAL HIGH (ref <1.0)

## 2018-09-23 LAB — LACTATE DEHYDROGENASE: LDH: 200 U/L — ABNORMAL HIGH (ref 98–192)

## 2018-09-23 LAB — D-DIMER, QUANTITATIVE: D-Dimer, Quant: 1.83 ug/mL-FEU — ABNORMAL HIGH (ref 0.00–0.50)

## 2018-09-23 LAB — FERRITIN: Ferritin: 20 ng/mL (ref 11–307)

## 2018-09-23 MED ORDER — INSULIN ASPART 100 UNIT/ML ~~LOC~~ SOLN
0.0000 [IU] | Freq: Every day | SUBCUTANEOUS | Status: DC
  Administered 2018-09-23: 3 [IU] via SUBCUTANEOUS

## 2018-09-23 MED ORDER — INSULIN STARTER KIT- PEN NEEDLES (ENGLISH)
1.0000 | Freq: Once | Status: AC
  Administered 2018-09-23: 1
  Filled 2018-09-23: qty 1

## 2018-09-23 MED ORDER — DEXAMETHASONE 4 MG PO TABS
4.0000 mg | ORAL_TABLET | Freq: Every day | ORAL | Status: DC
  Administered 2018-09-24: 4 mg via ORAL
  Filled 2018-09-23: qty 1

## 2018-09-23 MED ORDER — INSULIN ASPART 100 UNIT/ML ~~LOC~~ SOLN
0.0000 [IU] | Freq: Three times a day (TID) | SUBCUTANEOUS | Status: DC
  Administered 2018-09-23: 8 [IU] via SUBCUTANEOUS
  Administered 2018-09-24: 2 [IU] via SUBCUTANEOUS
  Administered 2018-09-24: 5 [IU] via SUBCUTANEOUS

## 2018-09-23 MED ORDER — INSULIN ASPART 100 UNIT/ML ~~LOC~~ SOLN
5.0000 [IU] | Freq: Three times a day (TID) | SUBCUTANEOUS | Status: DC
  Administered 2018-09-23 – 2018-09-24 (×4): 5 [IU] via SUBCUTANEOUS

## 2018-09-23 NOTE — Progress Notes (Signed)
Patient has updated her family and will let staff know if they have any questions.

## 2018-09-23 NOTE — Progress Notes (Signed)
PROGRESS NOTE                                                                                                                                                                                                             Patient Demographics:    Valerie Bauer, is a 48 y.o. female, DOB - [birth year], MCR:[id number]  Outpatient Primary MD for the patient is Danna Hefty, DO    LOS - 4  Admit date - 09/19/2018    Chief Complaint  Patient presents with  . Shortness of Breath       Brief Narrative  Valerie Bauer is an 48 y.o. female past medical history significant for non-insulin diabetes mellitus, Graves' disease who tested positive on 09/09/2018 presents on the day of admission saturating around 90% on room air complaining of dyspnea on exertion and a productive cough chest x-ray revealed bilateral infiltrate with small bilateral pleural effusions.   Subjective:    Valerie Bauer today has, No headache, No chest pain, No abdominal pain - No Nausea, No new weakness tingling or numbness, no Cough - SOB.     Assessment  & Plan :     1. Acute Hypoxic Resp. Failure due to Acute Covid 19 Viral Pneumonitis during the ongoing 2020 Covid 19 Pandemic - she is much improved received IV steroids and Remdisvir combination with good improvement, now down to 1 L nasal cannula oxygen and will try to titrate her off of oxygen today, increase activity, inflammatory markers stable she is symptom-free, finish IV Remdisvir course and prepare for discharge tomorrow.  Start tapering steroids.   COVID-19 Labs  Recent Labs    09/21/18 0200 09/22/18 0200 09/23/18 0315  DDIMER 2.36* 1.91* 1.83*  FERRITIN '17 18 20  '$ LDH 199* 207* 200*  CRP 2.1* 1.1* 1.0*    Lab Results  Component Value Date   SARSCOV2NAA Detected (A) 09/09/2018     Hepatic Function Latest Ref Rng & Units 09/23/2018 09/22/2018 09/21/2018  Total Protein 6.5 - 8.1 g/dL 6.8  6.9 6.9  Albumin 3.5 - 5.0 g/dL 3.5 3.4(L) 3.4(L)  AST 15 - 41 U/L 13(L) 12(L) 13(L)  ALT 0 - 44 U/L '12 12 13  '$ Alk Phosphatase 38 - 126 U/L 45 45 45  Total Bilirubin 0.3 - 1.2 mg/dL <0.1(L) 0.2(L) 0.3     No  results found for: BNP    2.  Essential hypertension.  Stable on ARB.  3.  Dyslipidemia.  On statin.  4.  Chronic microcytic anemia.  Outpatient work-up by PCP.  5.  Hypothyroidism.  Stable on Synthroid.  6.  DM type II.  Poor outpatient control due to hyperglycemia A1c was 10.5.  Currently on long and short-acting insulin.  Will provide insulin education prior to discharge.  Insulin dose dropped today, steroid being tapered.  CBG (last 3)  Recent Labs    09/23/18 0019 09/23/18 0433 09/23/18 0757  GLUCAP 183* 108* 87     Condition - Fair  Family Communication  :  None  Code Status :  Full  Diet :   Diet Order            Diet heart healthy/carb modified Room service appropriate? Yes; Fluid consistency: Thin  Diet effective now               Disposition Plan  : Home in am  Consults  :  None  Procedures  : None  PUD Prophylaxis : None  DVT Prophylaxis  :  Lovenox    Lab Results  Component Value Date   PLT 356 09/23/2018    Inpatient Medications  Scheduled Meds: . atorvastatin  40 mg Oral Daily  . dexamethasone  6 mg Oral Daily  . docusate sodium  100 mg Oral BID  . enoxaparin (LOVENOX) injection  40 mg Subcutaneous Daily  . ferrous sulfate  325 mg Oral Q breakfast  . insulin aspart  0-20 Units Subcutaneous TID WC  . insulin aspart  0-5 Units Subcutaneous QHS  . insulin aspart  12 Units Subcutaneous TID WC  . insulin detemir  15 Units Subcutaneous BID  . levothyroxine  125 mcg Oral Q0600  . losartan  50 mg Oral Daily  . sodium chloride flush  3 mL Intravenous Q12H  . vitamin C  500 mg Oral TID  . zinc sulfate  220 mg Oral Daily   Continuous Infusions: . sodium chloride    . remdesivir 100 mg in NS 250 mL 100 mg (09/22/18 1216)   PRN  Meds:.sodium chloride, acetaminophen, sodium chloride flush, traMADol, zolpidem  Antibiotics  :    Anti-infectives (From admission, onward)   Start     Dose/Rate Route Frequency Ordered Stop   09/21/18 1200  remdesivir 100 mg in sodium chloride 0.9 % 250 mL IVPB     100 mg 500 mL/hr over 30 Minutes Intravenous Every 24 hours 09/20/18 1028 09/25/18 1159   09/20/18 1200  remdesivir 200 mg in sodium chloride 0.9 % 250 mL IVPB     200 mg 500 mL/hr over 30 Minutes Intravenous Once 09/20/18 1028 09/20/18 1257       Time Spent in minutes  30   Lala Lund M.D on 09/23/2018 at 9:44 AM  To page go to [email or web address] - password [email or web address]  [facility] -  Office  [phone]    See all Orders from today for further details    Objective:   Vitals:   09/21/18 1942 09/22/18 0810 09/23/18 0430 09/23/18 0826  BP: 135/85 138/90 127/76 97/68  Pulse: 84 77    Resp: (!) 28 15    Temp: 97.9 F (36.6 C) 98.3 F (36.8 C) 98.1 F (36.7 C) 97.6 F (36.4 C)  TempSrc: Oral  Oral Oral  SpO2: 96% 98%    Weight:      Height:  Wt Readings from Last 3 Encounters:  09/19/18 78.9 kg  08/03/18 84.2 kg  07/01/18 84.9 kg    No intake or output data in the 24 hours ending 09/23/18 0944   Physical Exam  Awake Alert, Oriented X 3, No new F.N deficits, Normal affect [place].AT,PERRAL Supple Neck,No JVD, No cervical lymphadenopathy appriciated.  Symmetrical Chest wall movement, Good air movement bilaterally, CTAB RRR,No Gallops,Rubs or new Murmurs, No Parasternal Heave +ve B.Sounds, Abd Soft, No tenderness, No organomegaly appriciated, No rebound - guarding or rigidity. No Cyanosis, Clubbing or edema, No new Rash or bruise       Data Review:    CBC Recent Labs  Lab 09/19/18 1606 09/20/18 0845 09/21/18 0200 09/22/18 0200 09/23/18 0315  WBC 4.7 4.5 7.4 9.5 9.3  HGB 9.5* 9.6* 9.2* 9.2* 9.5*  HCT 33.6* 34.5* 33.7* 33.9* 35.0*  PLT 325 307 313 349 356  MCV 74.8* 75.8* 75.9* 76.7*  77.1*  MCH 21.2* 21.1* 20.7* 20.8* 20.9*  MCHC 28.3* 27.8* 27.3* 27.1* 27.1*  RDW 18.7* 18.6* 18.6* 18.8* 19.4*  LYMPHSABS  --  1.9 1.9 2.8 2.9  MONOABS  --  0.5 0.7 0.9 0.9  EOSABS  --  0.0 0.0 0.0 0.0  BASOSABS  --  0.0 0.0 0.0 0.0    Chemistries  Recent Labs  Lab 09/19/18 1606 09/20/18 0845 09/21/18 0200 09/22/18 0200 09/23/18 0315  NA 133* 138 137 138 138  K 3.7 3.4* 3.5 3.6 4.3  CL 101 102 103 104 108  CO2 20* '27 23 26 25  '$ GLUCOSE 350* 199* 230* 134* 119*  BUN '8 12 15 16 15  '$ CREATININE 0.65 0.53 0.61 0.59 0.58  CALCIUM 8.6* 8.5* 8.6* 8.6* 8.4*  AST  --  14* 13* 12* 13*  ALT  --  '12 13 12 12  '$ ALKPHOS  --  45 45 45 45  BILITOT  --  0.3 0.3 0.2* <0.1*   ------------------------------------------------------------------------------------------------------------------ No results for input(s): CHOL, HDL, LDLCALC, TRIG, CHOLHDL, LDLDIRECT in the last 72 hours.  Lab Results  Component Value Date   HGBA1C 10.5 (H) 09/20/2018   ------------------------------------------------------------------------------------------------------------------ No results for input(s): TSH, T4TOTAL, T3FREE, THYROIDAB in the last 72 hours.  Invalid input(s): FREET3  Cardiac Enzymes No results for input(s): CKMB, TROPONINI, MYOGLOBIN in the last 168 hours.  Invalid input(s): CK ------------------------------------------------------------------------------------------------------------------ No results found for: BNP  Micro Results Recent Results (from the past 240 hour(s))  Blood Culture (routine x 2)     Status: None (Preliminary result)   Collection Time: 09/19/18  4:55 PM   Specimen: BLOOD  Result Value Ref Range Status   Specimen Description BLOOD LEFT ANTECUBITAL  Final   Special Requests   Final    BOTTLES DRAWN AEROBIC AND ANAEROBIC Blood Culture results may not be optimal due to an excessive volume of blood received in culture bottles   Culture   Final    NO GROWTH 3 DAYS  Performed at [facility], [place] [address], [place], [place] [address]    Report Status PENDING  Incomplete  Blood Culture (routine x 2)     Status: None (Preliminary result)   Collection Time: 09/19/18  5:22 PM   Specimen: BLOOD  Result Value Ref Range Status   Specimen Description BLOOD RIGHT ANTECUBITAL  Final   Special Requests   Final    BOTTLES DRAWN AEROBIC AND ANAEROBIC Blood Culture adequate volume   Culture   Final    NO GROWTH 3 DAYS Performed at  [facility], [place] [address], [place], [place] [address]    Report Status PENDING  Incomplete    Radiology Reports Dg Chest Port 1 View  Result Date: 09/19/2018 CLINICAL DATA:  Shortness of breath and cough.  COVID-19 positive EXAM: PORTABLE CHEST 1 VIEW COMPARISON:  None. FINDINGS: There is patchy airspace opacity in the lung bases with small pleural effusions bilaterally. The lungs elsewhere are clear. Heart is upper normal in size with pulmonary vascularity normal. No adenopathy. No bone lesions. IMPRESSION: Bibasilar airspace opacity consistent with pneumonia with small pleural effusions bilaterally. Lungs elsewhere clear. No adenopathy. Heart upper normal in size. Electronically Signed   By: Lowella Grip III M.D.   On: 09/19/2018 17:21

## 2018-09-23 NOTE — Progress Notes (Signed)
Insulin starter kit given, patient was familiar with using a pen for injections because she uses Trulicity at home. She was able to demonstrate proper use of insulin pen and all questions were answered.

## 2018-09-23 NOTE — Progress Notes (Signed)
Inpatient Diabetes Program Recommendations  AACE/ADA: New Consensus Statement on Inpatient Glycemic Control (2015)  Target Ranges:  Prepandial:   less than 140 mg/dL      Peak postprandial:   less than 180 mg/dL (1-2 hours)      Critically ill patients:  140 - 180 mg/dL   Results for Valerie Bauer, Valerie Bauer (MRN [id number]) as of 09/23/2018 12:43  Ref. Range 09/23/2018 00:19 09/23/2018 04:33 09/23/2018 07:57 09/23/2018 12:17  Glucose-Capillary Latest Ref Range: 70 - 99 mg/dL 183 (H) 108 (H) 87 111 (H)   Results for Valerie Bauer, Valerie Bauer (MRN [id number]) as of 09/23/2018 12:43  Ref. Range 04/01/2018 08:57 09/20/2018 08:45  Hemoglobin A1C Latest Ref Range: 4.8 - 5.6 % 8.9 (A) 10.5 (H)  (254 mg/dl)     Admit with: COVID +  History: DM, Graves Disease  Home DM Meds: Trulicity 6.29 mg Qweek       Metformin 500 mg BID  Current Orders: Levemir 15 units BID      Novolog Moderate Correction Scale/ SSI (0-15 units) TID AC + HS      Novolog 5 units TID with meals      Decadron reduced to 4 mg daily today.  Per Dr. Keturah Barre notes, pt likely to be discharged home on insulin given her A1c was 10.5%.     MD- Please consider reducing Levemir to 10 units BID  AM CBG today was down to 87 mg/dl  If you decide to send pt home on insulin, please give her Rxs for insulin pens as she has familiarity with giving injections with her Trulicity medication  Levemir insulin pen- Order # [id number]  Novolog insulin pen- Order # [id number]  Insulin Pen needles- Order # [id number]     Spoke with pt by phone this afternoon to discuss going home on insulin as Dr. Keturah Barre notes stated that she will likely be discharged home on insulin give her A1c is elevated to 10.5%.  Pt very surprised to hear that her A1c was elevated.  Told me her last A1c checked was 8.7% back in May.  Has been taking her Metformin consistently but has not been taking the Trulicity on a consistent basis.  Sees Dr. [name] for regular medical care.   I encouraged pt to seek a follow up visit with her PCP soon after d/c to discuss her A1c and any new meds that Dr. Candiss Norse prescribes at time of d/c.    We discussed the importance of good CBG control.  Pt slightly hesitant to take insulin at time of d/c, however, willing to do so if needed.  We discussed how using insulin pens will be very similar to using her Trulicity injectable medication at home (since Trulicity comes in a pen injection form).  I sent pt a Youtube video on insulin pen injections via her email address and asked her to review prior to d/c.  Pt would like a follow up call in the AM just to check on her and see if she has any additional questions regarding insulin before she goes home.  We also reviewed Hypoglycemia signs and symptoms and treatment at home and pt able to provide return teach back.  Also reviewed goal CBGs for home.   --Will follow patient during hospitalization--  Wyn Quaker RN, MSN, CDE Diabetes Coordinator Inpatient Glycemic Control Team Team Pager: [phone] (8a-5p)

## 2018-09-24 LAB — GLUCOSE, CAPILLARY
Glucose-Capillary: 168 mg/dL — ABNORMAL HIGH (ref 70–99)
Glucose-Capillary: 139 mg/dL — ABNORMAL HIGH (ref 70–99)
Glucose-Capillary: 229 mg/dL — ABNORMAL HIGH (ref 70–99)

## 2018-09-24 LAB — CULTURE, BLOOD (ROUTINE X 2)
Culture: NO GROWTH
Culture: NO GROWTH
Special Requests: ADEQUATE

## 2018-09-24 MED ORDER — METHYLPREDNISOLONE 4 MG PO TBPK: ORAL_TABLET | ORAL | 0 refills | Status: DC

## 2018-09-24 MED ORDER — FREESTYLE LITE TEST VI STRP: ORAL_STRIP | 0 refills | Status: DC

## 2018-09-24 MED ORDER — INSULIN GLARGINE 100 UNIT/ML ~~LOC~~ SOLN: 20.0000 [IU] | Freq: Every day | SUBCUTANEOUS | 0 refills | Status: DC

## 2018-09-24 MED ORDER — BLOOD GLUCOSE MONITOR KIT: PACK | 1 refills | Status: DC

## 2018-09-24 MED ORDER — INSULIN ASPART 100 UNIT/ML ~~LOC~~ SOLN: SUBCUTANEOUS | 0 refills | Status: DC

## 2018-09-24 NOTE — Discharge Instructions (Signed)
Follow with Primary MD Tarry Kos, Kiersten P, DO in 7 days   Get CBC, CMP, 2 view Chest X ray -  checked next visit within 1 week by Primary MD    Activity: As tolerated with Full fall precautions use walker/cane & assistance as needed  Disposition Home   Diet: Heart Healthy Low Carb.  Accuchecks 4 times/day, Once in AM empty stomach and then before each meal. Log in all results and show them to your Prim.MD in 3 days. If any glucose reading is under 80 or above 300 call your Prim MD immidiately. Follow Low glucose instructions for glucose under 80 as instructed.    Special Instructions: If you have smoked or chewed Tobacco  in the last 2 yrs please stop smoking, stop any regular Alcohol  and or any Recreational drug use.  On your next visit with your primary care physician please Get Medicines reviewed and adjusted.  Please request your Prim.MD to go over all Hospital Tests and Procedure/Radiological results at the follow up, please get all Hospital records sent to your Prim MD by signing hospital release before you go home.  If you experience worsening of your admission symptoms, develop shortness of breath, life threatening emergency, suicidal or homicidal thoughts you must seek medical attention immediately by calling 911 or calling your MD immediately  if symptoms less severe.  You Must read complete instructions/literature along with all the possible adverse reactions/side effects for all the Medicines you take and that have been prescribed to you. Take any new Medicines after you have completely understood and accpet all the possible adverse reactions/side effects.    Insulin Pen Video: [email or web address]   Low Blood Sugar Video: [email or web address]   Symptoms of Low Blood Sugar: Silly, Sweaty, Shaky Check sugar if you have your meter.  If near or less than 70 mg/dl, treat with 1/2 cup juice or soda or take glucose  tablets Check sugar 15 minutes after treatment.  If sugar still near or less than 70 mg/al and symptomatic, treat again and may need a snack with some protein (peanut butter with crackers, etc)    Fingerstick glucose (sugar) goals for home: Before meals: 80-130 mg/dl 2-Hours after meals: less than 180 mg/dl Hemoglobin A1c goal: 7% or less      Person Under Monitoring Name: Valerie Bauer  Location: [address] [place] [address]   Infection Prevention Recommendations for Individuals Confirmed to have, or Being Evaluated for, 2019 Novel Coronavirus (COVID-19) Infection Who Receive Care at Home  Individuals who are confirmed to have, or are being evaluated for, COVID-19 should follow the prevention steps below until a healthcare provider or local or state health department says they can return to normal activities.  Stay home except to get medical care You should restrict activities outside your home, except for getting medical care. Do not go to work, school, or public areas, and do not use public transportation or taxis.  Call ahead before visiting your doctor Before your medical appointment, call the healthcare provider and tell them that you have, or are being evaluated for, COVID-19 infection. This will help the healthcare providers office take steps to keep other people from getting infected. Ask your healthcare provider to call the local or state health department.  Monitor your symptoms Seek prompt medical attention if your illness is worsening (e.g., difficulty breathing). Before going to your medical appointment, call the healthcare provider and tell them that you have, or are being evaluated for, COVID-19  infection. Ask your healthcare provider to call the local or state health department.  Wear a facemask You should wear a facemask that covers your nose and mouth when you are in the same room with other people and when you visit a healthcare  provider. People who live with or visit you should also wear a facemask while they are in the same room with you.  Separate yourself from other people in your home As much as possible, you should stay in a different room from other people in your home. Also, you should use a separate bathroom, if available.  Avoid sharing household items You should not share dishes, drinking glasses, cups, eating utensils, towels, bedding, or other items with other people in your home. After using these items, you should wash them thoroughly with soap and water.  Cover your coughs and sneezes Cover your mouth and nose with a tissue when you cough or sneeze, or you can cough or sneeze into your sleeve. Throw used tissues in a lined trash can, and immediately wash your hands with soap and water for at least 20 seconds or use an alcohol-based hand rub.  Wash your [facility] your hands often and thoroughly with soap and water for at least 20 seconds. You can use an alcohol-based hand sanitizer if soap and water are not available and if your hands are not visibly dirty. Avoid touching your eyes, nose, and mouth with unwashed hands.   Prevention Steps for Caregivers and Household Members of Individuals Confirmed to have, or Being Evaluated for, COVID-19 Infection Being Cared for in the Home  If you live with, or provide care at home for, a person confirmed to have, or being evaluated for, COVID-19 infection please follow these guidelines to prevent infection:  Follow healthcare providers instructions Make sure that you understand and can help the patient follow any healthcare provider instructions for all care.  Provide for the patients basic needs You should help the patient with basic needs in the home and provide support for getting groceries, prescriptions, and other personal needs.  Monitor the patients symptoms If they are getting sicker, call his or her medical provider and tell them that the  patient has, or is being evaluated for, COVID-19 infection. This will help the healthcare providers office take steps to keep other people from getting infected. Ask the healthcare provider to call the local or state health department.  Limit the number of people who have contact with the patient  If possible, have only one caregiver for the patient.  Other household members should stay in another home or place of residence. If this is not possible, they should stay  in another room, or be separated from the patient as much as possible. Use a separate bathroom, if available.  Restrict visitors who do not have an essential need to be in the home.  Keep older adults, very young children, and other sick people away from the patient Keep older adults, very young children, and those who have compromised immune systems or chronic health conditions away from the patient. This includes people with chronic heart, lung, or kidney conditions, diabetes, and cancer.  Ensure good ventilation Make sure that shared spaces in the home have good air flow, such as from an air conditioner or an opened window, weather permitting.  Wash your hands often  Wash your hands often and thoroughly with soap and water for at least 20 seconds. You can use an alcohol based hand sanitizer if soap  and water are not available and if your hands are not visibly dirty.  Avoid touching your eyes, nose, and mouth with unwashed hands.  Use disposable paper towels to dry your hands. If not available, use dedicated cloth towels and replace them when they become wet.  Wear a facemask and gloves  Wear a disposable facemask at all times in the room and gloves when you touch or have contact with the patients blood, body fluids, and/or secretions or excretions, such as sweat, saliva, sputum, nasal mucus, vomit, urine, or feces.  Ensure the mask fits over your nose and mouth tightly, and do not touch it during use.  Throw out  disposable facemasks and gloves after using them. Do not reuse.  Wash your hands immediately after removing your facemask and gloves.  If your personal clothing becomes contaminated, carefully remove clothing and launder. Wash your hands after handling contaminated clothing.  Place all used disposable facemasks, gloves, and other waste in a lined container before disposing them with other household waste.  Remove gloves and wash your hands immediately after handling these items.  Do not share dishes, glasses, or other household items with the patient  Avoid sharing household items. You should not share dishes, drinking glasses, cups, eating utensils, towels, bedding, or other items with a patient who is confirmed to have, or being evaluated for, COVID-19 infection.  After the person uses these items, you should wash them thoroughly with soap and water.  Wash laundry thoroughly  Immediately remove and wash clothes or bedding that have blood, body fluids, and/or secretions or excretions, such as sweat, saliva, sputum, nasal mucus, vomit, urine, or feces, on them.  Wear gloves when handling laundry from the patient.  Read and follow directions on labels of laundry or clothing items and detergent. In general, wash and dry with the warmest temperatures recommended on the label.  Clean all areas the individual has used often  Clean all touchable surfaces, such as counters, tabletops, doorknobs, bathroom fixtures, toilets, phones, keyboards, tablets, and bedside tables, every day. Also, clean any surfaces that may have blood, body fluids, and/or secretions or excretions on them.  Wear gloves when cleaning surfaces the patient has come in contact with.  Use a diluted bleach solution (e.g., dilute bleach with 1 part bleach and 10 parts water) or a household disinfectant with a label that says EPA-registered for coronaviruses. To make a bleach solution at home, add 1 tablespoon of bleach to 1  quart (4 cups) of water. For a larger supply, add  cup of bleach to 1 gallon (16 cups) of water.  Read labels of cleaning products and follow recommendations provided on product labels. Labels contain instructions for safe and effective use of the cleaning product including precautions you should take when applying the product, such as wearing gloves or eye protection and making sure you have good ventilation during use of the product.  Remove gloves and wash hands immediately after cleaning.  Monitor yourself for signs and symptoms of illness Caregivers and household members are considered close contacts, should monitor their health, and will be asked to limit movement outside of the home to the extent possible. Follow the monitoring steps for close contacts listed on the symptom monitoring form.   ? If you have additional questions, contact your local health department or call the epidemiologist on call at [phone] (available 24/7). ? This guidance is subject to change. For the most up-to-date guidance from [facility], please refer to their website: [email or web address]

## 2018-09-24 NOTE — Progress Notes (Signed)
Inpatient Diabetes Program Recommendations  AACE/ADA: New Consensus Statement on Inpatient Glycemic Control (2015)  Target Ranges:  Prepandial:   less than 140 mg/dL      Peak postprandial:   less than 180 mg/dL (1-2 hours)      Critically ill patients:  140 - 180 mg/dL   Called patient on her cell phone to see if she had questions on what to do with her insulin. Asked her to restate what to do if she had hypoglycemia. Patient was able to identify symptoms and treatment for hypoglycemia. Spoke with patient to call the PCP she wants to follow up with and get an appointment soon. If she sees her numbers decrease to 70-80 range all the time told her to call PCP for possible insulin adjustments. Patient has no further questions or concerns at this time prior to discharge.  Thanks,  Tama Headings RN, MSN, BC-ADM Inpatient Diabetes Coordinator Team Pager [phone] (8a-5p)

## 2018-09-24 NOTE — Discharge Summary (Signed)
Valerie Bauer OAC:[id number] DOB: 05-18-1970 DOA: 09/19/2018  PCP: Danna Hefty, DO  Admit date: 09/19/2018  Discharge date: 09/24/2018  Admitted From: Home  Disposition:  Home   Recommendations for Outpatient Follow-up:   Follow up with PCP in 1-2 weeks  PCP Please obtain BMP/CBC, 2 view CXR in 1week,  (see Discharge instructions)   PCP Please follow up on the following pending results:    Home Health: None   Equipment/Devices: None  Consultations: None  Discharge Condition: Stable    CODE STATUS: Full    Diet Recommendation: Heart Healthy Low Carb  Diet Order            Diet heart healthy/carb modified Room service appropriate? Yes; Fluid consistency: Thin  Diet effective now               Chief Complaint  Patient presents with  . Shortness of Breath     Brief history of present illness from the day of admission and additional interim summary    Valerie Bauer an 48 y.o.femalepast medical history significant for non-insulin diabetes mellitus, Graves' disease who tested positive on 09/09/2018 presents on the day of admission saturating around 90% on room air complaining of dyspnea on exertion and a productive cough chest x-ray revealed bilateral infiltrate with small bilateral pleural effusions.                                                                  Hospital Course    1. Acute Hypoxic Resp. Failure due to Acute Covid 19 Viral Pneumonitis during the ongoing 2020 Covid 19 Pandemic - she is much improved received IV steroids and Remdisvir combination with good improvement, now asymptomatic on room air, inflammatory markers stable she is symptom-free, will be discharged home on low-dose oral steroid taper.   2.  Essential hypertension.  Stable on ARB.  3.  Dyslipidemia.   On statin.  4.  Chronic microcytic anemia.  Outpatient work-up by PCP.  5.  Hypothyroidism.  Stable on Synthroid.  6.  DM type II.  Poor outpatient control due to hyperglycemia A1c was 10.5.    Continue home regimen added Lantus and sliding scale insulin and diabetic education provided.  Testing supplies provided.  PCP to monitor.   Discharge diagnosis     Active Problems:   Primary hypertension   Uncontrolled type 2 diabetes mellitus without complication, without long-term current use of insulin (HCC)   Hyperlipidemia associated with type 2 diabetes mellitus ([place])   Iron deficiency anemia   Hypothyroidism (acquired)   COVID-19 virus detected   Pneumonia due to COVID-19 virus   Acute respiratory failure with hypoxia [facility])    Discharge instructions    Discharge Instructions    Discharge instructions   Complete by: As directed  Follow with Primary MD Tarry Kos, Kiersten P, DO in 7 days   Get CBC, CMP, 2 view Chest X ray -  checked next visit within 1 week by Primary MD    Activity: As tolerated with Full fall precautions use walker/cane & assistance as needed  Disposition Home   Diet: Heart Healthy Low Carb.  Accuchecks 4 times/day, Once in AM empty stomach and then before each meal. Log in all results and show them to your Prim.MD in 3 days. If any glucose reading is under 80 or above 300 call your Prim MD immidiately. Follow Low glucose instructions for glucose under 80 as instructed.    Special Instructions: If you have smoked or chewed Tobacco  in the last 2 yrs please stop smoking, stop any regular Alcohol  and or any Recreational drug use.  On your next visit with your primary care physician please Get Medicines reviewed and adjusted.  Please request your Prim.MD to go over all Hospital Tests and Procedure/Radiological results at the follow up, please get all Hospital records sent to your Prim MD by signing hospital release before you go home.  If you  experience worsening of your admission symptoms, develop shortness of breath, life threatening emergency, suicidal or homicidal thoughts you must seek medical attention immediately by calling 911 or calling your MD immediately  if symptoms less severe.  You Must read complete instructions/literature along with all the possible adverse reactions/side effects for all the Medicines you take and that have been prescribed to you. Take any new Medicines after you have completely understood and accpet all the possible adverse reactions/side effects.   Increase activity slowly   Complete by: As directed       Discharge Medications   Allergies as of 09/24/2018   No Known Allergies     Medication List    STOP taking these medications   ibuprofen 600 MG tablet Commonly known as: ADVIL   ibuprofen 800 MG tablet Commonly known as: ADVIL     TAKE these medications   acetaminophen 500 MG tablet Commonly known as: TYLENOL Take 500 mg by mouth every 6 (six) hours as needed for mild pain or fever.   atorvastatin 40 MG tablet Commonly known as: LIPITOR Take 1 tablet (40 mg total) by mouth daily.   blood glucose meter kit and supplies Kit Dispense based on patient and insurance preference. Use up to four times daily as directed. (FOR ICD-9 250.00, 250.01). For QAC - HS accuchecks.   Dulaglutide 0.75 MG/0.5ML Sopn Commonly known as: Trulicity Inject 2.02 mg into the skin once a week. What changed: when to take this   ferrous sulfate 325 (65 FE) MG EC tablet Take 1 tablet (325 mg total) by mouth 3 (three) times daily with meals. What changed: when to take this   glucose blood test strip Commonly known as: OneTouch Verio Use up to twice daily testing. E11.9 What changed: Another medication with the same name was added. Make sure you understand how and when to take each.   FREESTYLE LITE test strip Generic drug: glucose blood For glucose testing every before meals at bedtime. Diagnosis E  11.65  Can substitute to any accepted brand What changed: You were already taking a medication with the same name, and this prescription was added. Make sure you understand how and when to take each.   insulin aspart 100 UNIT/ML injection Commonly known as: NovoLOG Before each meal 3 times a day , 140-199 - 2 units, 200-250 -  4 units, 251-299 - 6 units,  300-349 - 8 units,  350 or above 10 units. Insulin PEN if approved, provide syringes and needles if needed. Can switch to any other cheaper alternative.   insulin glargine 100 UNIT/ML injection Commonly known as: Lantus Inject 0.2 mLs (20 Units total) into the skin daily. Dispense insulin pen if approved, if not dispense as needed syringes and needles for 1 month supply. Can switch to Levemir. Diagnosis E 11.65.   levothyroxine 125 MCG tablet Commonly known as: SYNTHROID Take 1 tablet (125 mcg total) by mouth daily.   losartan 50 MG tablet Commonly known as: COZAAR Take 1 tablet (50 mg total) by mouth daily.   megestrol 40 MG tablet Commonly known as: MEGACE Take 1 tablet (40 mg total) by mouth 2 (two) times daily. Start with once daily, increase to twice daily if no improvement in bleeding.   metFORMIN 500 MG 24 hr tablet Commonly known as: GLUCOPHAGE-XR Take 1 tablet (500 mg total) by mouth 2 (two) times a day.   methylPREDNISolone 4 MG Tbpk tablet Commonly known as: MEDROL DOSEPAK follow package directions   norethindrone 5 MG tablet Commonly known as: AYGESTIN Take 1 tablet (5 mg total) by mouth daily.   ONE TOUCH DELICA LANCING DEV Misc Use up to twice daily testing. D40.8   OneTouch Delica Lancets 14G Misc Use up to twice daily testing. E11.9   terconazole 0.8 % vaginal cream Commonly known as: Terazol 3 Place 1 applicator vaginally at bedtime.   traMADol 50 MG tablet Commonly known as: ULTRAM Take 1 tablet (50 mg total) by mouth every 6 (six) hours as needed.   valACYclovir 500 MG tablet Commonly known as:  VALTREX Take 1 tablet (500 mg total) by mouth daily. Can increase to twice a day for 5 days in the event of a recurrence What changed:   when to take this  additional instructions       Follow-up Information    Mullis, Kiersten P, DO. Schedule an appointment as soon as possible for a visit in 1 week(s).   Specialty: Family Medicine Contact information: [address] [place] [address] [phone]           Major procedures and Radiology Reports - PLEASE review detailed and final reports thoroughly  -       Dg Chest Port 1 View  Result Date: 09/19/2018 CLINICAL DATA:  Shortness of breath and cough.  COVID-19 positive EXAM: PORTABLE CHEST 1 VIEW COMPARISON:  None. FINDINGS: There is patchy airspace opacity in the lung bases with small pleural effusions bilaterally. The lungs elsewhere are clear. Heart is upper normal in size with pulmonary vascularity normal. No adenopathy. No bone lesions. IMPRESSION: Bibasilar airspace opacity consistent with pneumonia with small pleural effusions bilaterally. Lungs elsewhere clear. No adenopathy. Heart upper normal in size. Electronically Signed   By: Lowella Grip III M.D.   On: 09/19/2018 17:21    Micro Results     Recent Results (from the past 240 hour(s))  Blood Culture (routine x 2)     Status: None (Preliminary result)   Collection Time: 09/19/18  4:55 PM   Specimen: BLOOD  Result Value Ref Range Status   Specimen Description BLOOD LEFT ANTECUBITAL  Final   Special Requests   Final    BOTTLES DRAWN AEROBIC AND ANAEROBIC Blood Culture results may not be optimal due to an excessive volume of blood received in culture bottles   Culture   Final  NO GROWTH 4 DAYS Performed at [facility], [place] [address] [place], [place] [address]    Report Status PENDING  Incomplete  Blood Culture (routine x 2)     Status: None (Preliminary result)   Collection Time: 09/19/18  5:22 PM   Specimen: BLOOD  Result Value Ref  Range Status   Specimen Description BLOOD RIGHT ANTECUBITAL  Final   Special Requests   Final    BOTTLES DRAWN AEROBIC AND ANAEROBIC Blood Culture adequate volume   Culture   Final    NO GROWTH 4 DAYS Performed at [facility], [place] [address] [place], [place] [address]    Report Status PENDING  Incomplete    Today   Subjective    Valerie Bauer today has no headache,no chest abdominal pain,no new weakness tingling or numbness, feels much better wants to go home today.   Objective   Blood pressure 109/68, pulse 63, temperature 98.1 F (36.7 C), resp. rate 14, height '5\' 5"'$  (1.651 m), weight 78.9 kg, SpO2 95 %.  No intake or output data in the 24 hours ending 09/24/18 0849  Exam  Awake Alert, Oriented x 3, No new F.N deficits, Normal affect [place].AT,PERRAL Supple Neck,No JVD, No cervical lymphadenopathy appriciated.  Symmetrical Chest wall movement, Good air movement bilaterally, CTAB RRR,No Gallops,Rubs or new Murmurs, No Parasternal Heave +ve B.Sounds, Abd Soft, Non tender, No organomegaly appriciated, No rebound -guarding or rigidity. No Cyanosis, Clubbing or edema, No new Rash or bruise   Data Review   CBC w Diff:  Lab Results  Component Value Date   WBC 9.3 09/23/2018   HGB 9.5 (L) 09/23/2018   HGB 8.7 (L) 05/06/2018   HCT 35.0 (L) 09/23/2018   HCT 31.6 (L) 05/06/2018   PLT 356 09/23/2018   PLT 346 05/06/2018   LYMPHOPCT 32 09/23/2018   MONOPCT 10 09/23/2018   EOSPCT 0 09/23/2018   BASOPCT 0 09/23/2018    CMP:  Lab Results  Component Value Date   NA 138 09/23/2018   NA 135 05/06/2018   K 4.3 09/23/2018   CL 108 09/23/2018   CO2 25 09/23/2018   BUN 15 09/23/2018   BUN 12 05/06/2018   CREATININE 0.58 09/23/2018   CREATININE 0.68 05/04/2015   PROT 6.8 09/23/2018   PROT 6.7 05/06/2018   ALBUMIN 3.5 09/23/2018   ALBUMIN 4.4 05/06/2018   BILITOT <0.1 (L) 09/23/2018   BILITOT 0.2 05/06/2018   ALKPHOS 45 09/23/2018   AST 13 (L) 09/23/2018   ALT 12  09/23/2018   Lab Results  Component Value Date   HGBA1C 10.5 (H) 09/20/2018   COVID-19 Labs  Recent Labs    09/22/18 0200 09/23/18 0315  DDIMER 1.91* 1.83*  FERRITIN 18 20  LDH 207* 200*  CRP 1.1* 1.0*    Lab Results  Component Value Date   SARSCOV2NAA Detected (A) 09/09/2018      Total Time in preparing paper work, data evaluation and todays exam - 11 minutes  Lala Lund M.D on 09/24/2018 at 8:49 AM  [facility]   Office  [phone]

## 2018-09-24 NOTE — Telephone Encounter (Signed)
Called patient to check in. She is feeling much better in regards to the coronavirus infection and her respiratory status. Discussed diabetes management as internist discharged patient on Lantus/Novolog. After lengthy discussion, decided to continue current insulin instructions and hold off on restarting Trulicity at this time. Patient to keep log of blood sugars and take Lantus/Novolog as prescribed. She will follow up with me next week for further evaluation and modification of her diabetes medications. Will consider resetarting Trulicity at that time and weaning off insulin. Patient understood and agreed to this plan.

## 2018-09-24 NOTE — Telephone Encounter (Signed)
2nd request . Jessica Fleeger, CMA  

## 2018-09-27 ENCOUNTER — Encounter: Payer: Self-pay | Admitting: Family Medicine

## 2018-09-28 ENCOUNTER — Other Ambulatory Visit: Payer: Self-pay | Admitting: Family Medicine

## 2018-09-28 ENCOUNTER — Telehealth: Payer: Self-pay | Admitting: Family Medicine

## 2018-09-28 DIAGNOSIS — IMO0001 Reserved for inherently not codable concepts without codable children: Secondary | ICD-10-CM

## 2018-09-28 MED ORDER — TRULICITY 0.75 MG/0.5ML ~~LOC~~ SOAJ: 0.7500 mg | SUBCUTANEOUS | 1 refills | Status: DC

## 2018-09-28 NOTE — Telephone Encounter (Signed)
FMLA form dropped off for at front desk for completion.  Verified that patient section of form has been completed.  Last DOS/WCC with PCP was 07/01/18.  Placed form in team folder to be completed by clinical staff.  Crista Luria

## 2018-09-28 NOTE — Progress Notes (Signed)
At this time, will restart Trulicity and further discuss changes at follow up visit this week. Can consdier increasing Metformin. Patient is able to pick up needles OTC for her Lantus if we do start that. Insurance will cover Humalog. Will call in refill for Trulicity at this time.

## 2018-09-28 NOTE — Telephone Encounter (Signed)
Reviewed FMLA form and placed in PCP's box for completion.  .Slater Mcmanaman R Ante Arredondo, CMA  

## 2018-09-29 ENCOUNTER — Encounter: Payer: Self-pay | Admitting: Family Medicine

## 2018-09-30 ENCOUNTER — Telehealth: Payer: Self-pay

## 2018-09-30 DIAGNOSIS — Z3482 Encounter for supervision of other normal pregnancy, second trimester: Secondary | ICD-10-CM

## 2018-09-30 DIAGNOSIS — IMO0001 Reserved for inherently not codable concepts without codable children: Secondary | ICD-10-CM

## 2018-09-30 MED ORDER — INSULIN LISPRO (1 UNIT DIAL) 100 UNIT/ML (KWIKPEN): PEN_INJECTOR | SUBCUTANEOUS | 11 refills | Status: DC

## 2018-09-30 NOTE — Telephone Encounter (Signed)
Spoke to patient. She notes she is having elevated blood sugars up to 400's. She called ED last night and they informed her to come to be evaluated however she rechecked her blood sugar and it was trending down so she decided not to go. She woke up this AM and checked her blood sugar and it was 258. She also endorses increased thirst. She took her weekly Trulicity yesterday. She has her last dose of Methylprednisone tomorrow. Patient has telemedicine visit scheduled for tomorrow morning. Will call in rx for Humalog sliding scale to be taken TID before meals. These instructions were provided. She is to continue to keep a log of her blood sugars. Expect some improvement in her sugars once off the steroid taper.

## 2018-09-30 NOTE — Telephone Encounter (Signed)
Patient calls nurse line stating her insurance will only cover Humalog. Can this be sent in? Please advise.

## 2018-10-01 ENCOUNTER — Telehealth (INDEPENDENT_AMBULATORY_CARE_PROVIDER_SITE_OTHER): Payer: 59 | Admitting: Family Medicine

## 2018-10-01 ENCOUNTER — Other Ambulatory Visit: Payer: Self-pay

## 2018-10-01 DIAGNOSIS — J9601 Acute respiratory failure with hypoxia: Secondary | ICD-10-CM | POA: Diagnosis not present

## 2018-10-01 DIAGNOSIS — IMO0001 Reserved for inherently not codable concepts without codable children: Secondary | ICD-10-CM

## 2018-10-01 DIAGNOSIS — E1165 Type 2 diabetes mellitus with hyperglycemia: Secondary | ICD-10-CM | POA: Diagnosis not present

## 2018-10-01 MED ORDER — METFORMIN HCL ER 500 MG PO TB24: 1000.0000 mg | ORAL_TABLET | Freq: Two times a day (BID) | ORAL | 2 refills | Status: DC

## 2018-10-01 NOTE — Assessment & Plan Note (Signed)
Resolved and feeling back to baseline. No further symptoms. Last day of steroid taper today.

## 2018-10-01 NOTE — Telephone Encounter (Signed)
Patient contacted and informed FMLA up front for pick up. Copy made for batch scanning.

## 2018-10-01 NOTE — Progress Notes (Signed)
[place] Telemedicine Visit  Patient consented to have virtual visit. Method of visit: Telephone  Encounter participants: Patient: Valerie Bauer - located at Home Provider: Danna Hefty - located at [facility] Others (if applicable): None  Chief Complaint: COVID F/u and DM   HPI: COVID19 Infection: Hospitalized for SOB 2/2 to COVID 19 from 8/1-8/6. She was treated with IV steroids and Remdisvir combo with good improvement. She discharged on steroid taper.Today is her last day of the steroid. She is feeling much better. Denies any fevers, chills, SOB, or body aches. She is continuing self isolation.   Uncontrolled T2DM:  Last A1C on 09/20/2018 was 10.5. She is currently taking Metformin 500mg  BID. She was supposed to be on Trulicity but self discontinued until recently. She was discharged from hospital with instructions to take Lantus and sliding scale Novolog but due to difficulty with insurance this was not started. She restarted her weekly Trulicity on 1/74  and using Humalog (preferred) TID with meals. She started Humalog last night. CBG in 300's last night. This AM 264 - took 6U of insulin. Denies polyuria, polyphagia, polydipsia. Denies hypoglycemic episodes.    ROS: per HPI  Pertinent PMHx: Uncontrolled T2DM, HTN  Exam:  Gen: pleasant lady  Respiratory: Speaking in full sentences, breathing comfortably  Assessment/Plan:  Acute respiratory failure with hypoxia ([place]) Resolved and feeling back to baseline. No further symptoms. Last day of steroid taper today.   Uncontrolled type 2 diabetes mellitus without complication, without long-term current use of insulin (HCC) Uncontrolled. Steroid likely contributing to elevated blood sugars. CBGs in 200-400's.  - Increase Metformin to 1000mg  BID. Recommended 1500mg  QD x 2-3 days then increase to 2000mg  QD  - continue Trulicity 0.75mg   - continue Humalog sliding scale TID with meals  - log blood sugars -  Follow up Telemed visit on 8/17 - If continues to remain elevated, will plan to add SGLT-2 (empagliflozin) with close follow up. If she continues to remain elevated, then plan to increase Trulicity to 1.5mg . If she continues to remain elevated after this then may have to consider starting Lantus - will plan to check kidney function in 1 month, repeat A1C in 3 months    Time spent during visit with patient: 21 minutes  [name], [place], PGY2

## 2018-10-01 NOTE — Telephone Encounter (Signed)
FMLA form completed and placed in nurse forms/PA box

## 2018-10-01 NOTE — Assessment & Plan Note (Signed)
Uncontrolled. Steroid likely contributing to elevated blood sugars. CBGs in 200-400's.  - Increase Metformin to 1000mg  BID. Recommended 1500mg  QD x 2-3 days then increase to 2000mg  QD  - continue Trulicity 0.75mg   - continue Humalog sliding scale TID with meals  - log blood sugars - Follow up Telemed visit on 8/17 - If continues to remain elevated, will plan to add SGLT-2 (empagliflozin) with close follow up. If she continues to remain elevated, then plan to increase Trulicity to 1.5mg . If she continues to remain elevated after this then may have to consider starting Lantus - will plan to check kidney function in 1 month, repeat A1C in 3 months

## 2018-10-05 ENCOUNTER — Telehealth (INDEPENDENT_AMBULATORY_CARE_PROVIDER_SITE_OTHER): Payer: 59 | Admitting: Family Medicine

## 2018-10-05 ENCOUNTER — Other Ambulatory Visit: Payer: Self-pay

## 2018-10-05 DIAGNOSIS — E1165 Type 2 diabetes mellitus with hyperglycemia: Secondary | ICD-10-CM | POA: Diagnosis not present

## 2018-10-05 DIAGNOSIS — IMO0001 Reserved for inherently not codable concepts without codable children: Secondary | ICD-10-CM

## 2018-10-05 MED ORDER — EMPAGLIFLOZIN 10 MG PO TABS: 10.0000 mg | ORAL_TABLET | Freq: Every day | ORAL | 3 refills | Status: DC

## 2018-10-05 MED ORDER — NORETHINDRONE ACETATE 5 MG PO TABS: 5.0000 mg | ORAL_TABLET | Freq: Every day | ORAL | 2 refills | Status: DC

## 2018-10-05 NOTE — Assessment & Plan Note (Addendum)
Blood sugars improved but not ideal. No adverse effects with medications.  - increase metformin to 1000mg  BID - Continue Trulicity 0.75mg  TID q weekly - Begin Jardiance 10mg  QD  - Continue Humalog sliding scale TID and monitor blood sugars in log - Follow up telemed visit on 10/27/18 for DM follow up - will plan to check kidney function in 1 month, repeat A1C in 3 months - Consider increase in Trulicty to 1.5mg  if continues to remain elevated. Goal is to wean off sliding scale - return precautions discussed

## 2018-10-05 NOTE — Progress Notes (Signed)
[place] Telemedicine Visit  Patient consented to have virtual visit. Method of visit: Telephone  Encounter participants: Patient: Valerie Bauer - located at Home Provider: Danna Hefty - located at [facility] Others (if applicable): None  Chief Complaint: Diabetes Follow up  HPI: Patient had telemed visit on 8/13. Her metformin 1500mg  QD. She plans on increasing to 2000mg  today. She is also currently on Trulicity 0.75mg  q weekly and  Humalog TID with meals. Her CBG's 180-280. Denies any hypoglycemia. Denies any polyuria or polydipsia. Notes she is feeling much better than before.   ROS: per HPI  Pertinent PMHx: Uncontrolled T2DM  Exam:  Respiratory: Speaking in full sentences  Assessment/Plan:  Uncontrolled type 2 diabetes mellitus without complication, without long-term current use of insulin (HCC) Blood sugars improved but not ideal. No adverse effects with medications.  - increase metformin to 1000mg  BID - Continue Trulicity 0.75mg  TID q weekly - Begin Jardiance 10mg  QD  - Continue Humalog sliding scale TID and monitor blood sugars in log - Follow up telemed visit on 10/27/18 for DM follow up - will plan to check kidney function in 1 month, repeat A1C in 3 months - Consider increase in Trulicty to 1.5mg  if continues to remain elevated. Goal is to wean off sliding scale - return precautions discussed    Time spent during visit with patient: 21 minutes  [name], [place], PGY2 10/05/2018

## 2018-10-07 ENCOUNTER — Telehealth: Payer: Self-pay | Admitting: Obstetrics and Gynecology

## 2018-10-07 NOTE — Telephone Encounter (Signed)
Patient scheduled for TAH, BS on 10/14/18. Pre-op review of patient's chart shows she was hospitalized for COVID-19 pneumonia 81/20 - 09/24/18. She has been followed by her PCP since for uncontrolled Type 2 DM.  I called patient to advise her of my recommendation to delay surgery given the risks associated with elective surgery so soon after hospitalization for COVID-19 pneumonia. Reviewed much is unknown about recovery from COVID-19 pneumonia and even though she is feeling well, for her safety, will not proceed with an elective procedure at this time. Reviewed risks of anesthesia and prolonged recovery associated with an open procedure in a healthy patient and that they are presumed to be significantly elevated in a patient with recent pneumonia on top of poorly controlled diabetes and that the safest option would be to delay. She is understandably disappointed and upset at further delay in surgery but verbalizes understanding.   Will plan to reschedule, patient to call office with any paperwork/FMLA documentation she needs on our part to accommodate delay in surgery.    Feliz Beam, M.D. Attending Center for [employer] manager)

## 2018-10-08 ENCOUNTER — Encounter: Payer: Self-pay | Admitting: Family Medicine

## 2018-10-09 ENCOUNTER — Other Ambulatory Visit (HOSPITAL_COMMUNITY): Payer: 59

## 2018-10-09 ENCOUNTER — Inpatient Hospital Stay (HOSPITAL_COMMUNITY): Admission: RE | Admit: 2018-10-09 | Payer: 59 | Source: Ambulatory Visit

## 2018-10-13 ENCOUNTER — Encounter (HOSPITAL_COMMUNITY): Admission: RE | Payer: Self-pay | Source: Home / Self Care

## 2018-10-13 ENCOUNTER — Inpatient Hospital Stay (HOSPITAL_COMMUNITY): Admission: RE | Admit: 2018-10-13 | Payer: 59 | Source: Home / Self Care | Admitting: Obstetrics and Gynecology

## 2018-10-13 ENCOUNTER — Encounter: Payer: Self-pay | Admitting: Family Medicine

## 2018-10-13 SURGERY — HYSTERECTOMY, TOTAL, ABDOMINAL, WITH SALPINGECTOMY
Anesthesia: Choice | Laterality: Bilateral

## 2018-10-27 ENCOUNTER — Telehealth (INDEPENDENT_AMBULATORY_CARE_PROVIDER_SITE_OTHER): Payer: 59 | Admitting: Family Medicine

## 2018-10-27 ENCOUNTER — Other Ambulatory Visit: Payer: Self-pay

## 2018-10-27 VITALS — Wt 174.0 lb

## 2018-10-27 DIAGNOSIS — IMO0001 Reserved for inherently not codable concepts without codable children: Secondary | ICD-10-CM

## 2018-10-27 DIAGNOSIS — E1165 Type 2 diabetes mellitus with hyperglycemia: Secondary | ICD-10-CM

## 2018-10-27 NOTE — Assessment & Plan Note (Addendum)
Much better blood sugar ranges without any adverse medication effects. One episode of lower BS but otherwise no hypoglycemic episodes. Will plan to continue current regimen, check kidney function next month and follow up in November for A1C check.  - continue Metformin 1000mg  BID, Trulicity 0.75mg  q weekly, Jardiance 10mg  QD - STOP humalog sliding scale - Discussed less frequent sugar monitoring - recommended QD blood sugars, keep BS log, call if sugars become consistently >200 - BMP lab appt 11/23/18  - Follow up early November for repeat A1C and diabetes follow up - Return precautions discussed

## 2018-10-27 NOTE — Progress Notes (Signed)
[place] Telemedicine Visit  Patient consented to have virtual visit. Method of visit: Telephone  Encounter participants: Patient: Valerie Bauer - located at Work Provider: Danna Hefty - located at [facility] Others (if applicable): None  Chief Complaint: Diabetes Follow up  HPI: Patient seen on 8/17 for diabetes follow up. She is currently Metformin 1000mg  BID, Trulicity 0.75mg  Q weekly, Jardiance 10mg  QD. Denies any medication side effects. Denies any abdominal pain, polydipsia, polyuria, polyphagia. CBG's since last visit range from 115-175. She notes she had one episode of lower blood sugars in the 80's that she felt a little off, but improved with food. Has not been using the sliding scale insulin.   ROS: per HPI  Pertinent PMHx: uncontrolled T2DM  Exam:  Respiratory: Speaking in full sentences.   Assessment/Plan:  Uncontrolled type 2 diabetes mellitus without complication, without long-term current use of insulin (HCC) Much better blood sugar ranges without any adverse medication effects. One episode of lower BS but otherwise no hypoglycemic episodes. Will plan to continue current regimen, check kidney function next month and follow up in November for A1C check.  - continue Metformin 1000mg  BID, Trulicity 0.75mg  q weekly, Jardiance 10mg  QD - STOP humalog sliding scale - Discussed less frequent sugar monitoring - recommended QD blood sugars, keep BS log, call if sugars become consistently >200 - BMP lab appt 11/23/18  - Follow up early November for repeat A1C and diabetes follow up - Return precautions discussed   Time spent during visit with patient: 15 minutes  [name], DO [place], PGY2 10/27/2018

## 2018-11-06 ENCOUNTER — Telehealth: Payer: Self-pay | Admitting: Obstetrics and Gynecology

## 2018-11-06 NOTE — Telephone Encounter (Signed)
Returned patient call, she was wondering about other procedures that could be done to manage her fibroids, TAH delayed until her DM under better control. Reviewed option for [place], sent info, she is potentially interested in this. Will review with her next week.  -KMD

## 2018-11-23 ENCOUNTER — Other Ambulatory Visit: Payer: 59

## 2018-11-23 ENCOUNTER — Other Ambulatory Visit: Payer: Self-pay

## 2018-11-23 DIAGNOSIS — IMO0001 Reserved for inherently not codable concepts without codable children: Secondary | ICD-10-CM

## 2018-11-24 ENCOUNTER — Telehealth: Payer: Self-pay | Admitting: Obstetrics and Gynecology

## 2018-11-24 LAB — BASIC METABOLIC PANEL
BUN/Creatinine Ratio: 19 (ref 9–23)
BUN: 12 mg/dL (ref 6–24)
CO2: 21 mmol/L (ref 20–29)
Calcium: 9.2 mg/dL (ref 8.7–10.2)
Chloride: 102 mmol/L (ref 96–106)
Creatinine, Ser: 0.63 mg/dL (ref 0.57–1.00)
GFR calc Af Amer: 123 mL/min/{1.73_m2} (ref >59)
GFR calc non Af Amer: 106 mL/min/{1.73_m2} (ref >59)
Glucose: 131 mg/dL — ABNORMAL HIGH (ref 65–99)
Potassium: 4.1 mmol/L (ref 3.5–5.2)
Sodium: 137 mmol/L (ref 134–144)

## 2018-11-24 NOTE — Telephone Encounter (Signed)
Called patient to discuss possible hysterectomy, offered option of getting her on December schedule and planning for TAH, BS provider her A1c is is much better range, versus consult for [place]. She is definitely interested in TAH but if she could have [place] sooner, would be willing to try that. Will f/u with IR and anesthesia regarding possible [place] and A1c recommendations.    Feliz Beam, M.D. Attending Center for [employer] farm manager)

## 2018-11-27 ENCOUNTER — Encounter: Payer: Self-pay | Admitting: Family Medicine

## 2018-11-30 NOTE — Telephone Encounter (Signed)
Spoke to patient. Informed her that a random BS of 131 is appropriate. She was relieved. Clarified diabetes regimen to include Metformin 1000mg  BID, Trulicity 0.75mg  q weekly, and Jardiance 10mg  QD. She was confused and thought she was not supposed to be taking the Trulicity. Patient agrees to restart the Trulicity. Follow up appointment scheduled for 11/6 with me.

## 2018-12-17 ENCOUNTER — Telehealth: Payer: Self-pay

## 2018-12-17 ENCOUNTER — Other Ambulatory Visit: Payer: Self-pay | Admitting: Obstetrics and Gynecology

## 2018-12-17 DIAGNOSIS — D259 Leiomyoma of uterus, unspecified: Secondary | ICD-10-CM

## 2018-12-17 NOTE — Telephone Encounter (Signed)
Referral for interventional radiology ordered. [facility] called. Spoke with Olegario Shearer, who states she will reach out to the patient to schedule an appt.

## 2018-12-17 NOTE — Telephone Encounter (Signed)
-----   Message from Sloan Leiter, MD sent at 12/16/2018 10:53 AM EDT ----- Patient needs referral and appointment for Interventional Radiology for possible uterine artery embolization.

## 2018-12-21 ENCOUNTER — Other Ambulatory Visit: Payer: 59

## 2018-12-23 ENCOUNTER — Other Ambulatory Visit: Payer: Self-pay

## 2018-12-23 ENCOUNTER — Ambulatory Visit
Admission: RE | Admit: 2018-12-23 | Discharge: 2018-12-23 | Disposition: A | Discharge status: Home / Self Care | Payer: 59 | Source: Ambulatory Visit | Attending: Obstetrics and Gynecology | Admitting: Obstetrics and Gynecology

## 2018-12-23 DIAGNOSIS — D259 Leiomyoma of uterus, unspecified: Secondary | ICD-10-CM

## 2018-12-23 HISTORY — PX: IR RADIOLOGIST EVAL & MGMT: IMG5224

## 2018-12-23 NOTE — Consult Note (Signed)
Chief Complaint: Patient was consulted remotely today (TeleHealth) for uterine fibroids and menorrhagia at the request of Davis,[name]    Referring Physician(s): Davis,Kelly M  History of Present Illness: Valerie Bauer is a 48 y.o. female with history of uterine fibroids and abnormal uterine bleeding.  Endometrial biopsy was negative for malignancy.  Patient was scheduled to have a hysterectomy in August but unfortunately she contracted COVID-19 and was hospitalized for approximately 5 days.  Past medical history is significant for noninsulin diabetes, hypertension, chronic microcytic anemia and hypothyroidism.  Patient's diabetes has been poorly controlled with elevated A1c.  Since the COVID-19 infection, it sounds like she has been taking better care of the diabetes.  She is concerned about being out of work for 4 to 6 weeks following a hysterectomy and interested in other options to treat her uterine fibroids that would have shorter recovery.  Pregnancy history is G3 P3.  She is working for [employer].  She has had heavy menstrual bleeding for many years.  Menstrual period is regular and last approximately 5 days.  She has 2 days of very heavy flow to the point where she cannot leave the house or go to church sometimes. She has pain associated with the menstrual bleeding and typically has back pain following the menstrual bleeding.  She is not currently taking any hormonal therapy.  Patient has had a tubal ligation.  She reports a history of pelvic inflammatory disease in the 90s but no significant pelvic infections recently.   Past Medical History:  Diagnosis Date  . Diabetes mellitus without complication ([place]) 7858  . HSV infection 2011   dx 2011  . Hypertension 2017    Past Surgical History:  Procedure Laterality Date  . BREAST BIOPSY Left 11/20/2009  . CRYOABLATION  2014   cervical cryoablation ~ 2014  . TUBAL LIGATION  1999    Allergies: Patient  has no known allergies.  Medications: Prior to Admission medications   Medication Sig Start Date End Date Taking? Authorizing Provider  acetaminophen (TYLENOL) 500 MG tablet Take 500 mg by mouth every 6 (six) hours as needed for mild pain or fever.    [provider]  atorvastatin (LIPITOR) 40 MG tablet Take 1 tablet (40 mg total) by mouth daily. 04/01/18   [place] Bing, DO  blood glucose meter kit and supplies KIT Dispense based on patient and insurance preference. Use up to four times daily as directed. (FOR ICD-9 250.00, 250.01). For QAC - HS accuchecks. 09/24/18   Thurnell Lose, MD  Dulaglutide (TRULICITY) 8.50 YD/7.4JO SOPN Inject 0.75 mg into the skin once a week. 09/28/18   Mullis, Kiersten P, DO  empagliflozin (JARDIANCE) 10 MG TABS tablet Take 10 mg by mouth daily. 10/05/18   Mullis, Kiersten P, DO  ferrous sulfate 325 (65 FE) MG EC tablet Take 1 tablet (325 mg total) by mouth 3 (three) times daily with meals. Patient taking differently: Take 325 mg by mouth daily with breakfast.  07/19/16   [place] Bing, DO  glucose blood (FREESTYLE LITE) test strip For glucose testing every before meals at bedtime. Diagnosis E 11.65  Can substitute to any accepted brand 09/24/18   Thurnell Lose, MD  glucose blood (ONETOUCH VERIO) test strip Use up to twice daily testing. E11.9 04/01/18   [place] Bing, DO  insulin lispro (HUMALOG KWIKPEN) 100 UNIT/ML KwikPen Before each meal 3 times a day. 140-199 - 2 units, 200-250 - 4 units,  251-299 - 6 units, 300-349 - 8 units, 350 or above 10 units. Insulin PEN if approved, provide syringes and needles if needed. Patient taking differently: Inject 2-10 Units into the skin 3 (three) times daily before meals. Before each meal 3 times a day. 140-199 - 2 units, 200-250 - 4 units, 251-299 - 6 units, 300-349 - 8 units, 350 or above 10 units. Insulin PEN if approved, provide syringes and needles if needed. 09/30/18   Mullis, Kiersten P, DO  Lancet  Devices (ONE TOUCH DELICA LANCING DEV) MISC Use up to twice daily testing. E11.9 07/18/16   Zenia Resides, MD  levothyroxine (SYNTHROID, LEVOTHROID) 125 MCG tablet Take 1 tablet (125 mcg total) by mouth daily. 04/01/18   [place] Bing, DO  losartan (COZAAR) 50 MG tablet Take 1 tablet (50 mg total) by mouth daily. 04/01/18   [place] Bing, DO  megestrol (MEGACE) 40 MG tablet Take 1 tablet (40 mg total) by mouth 2 (two) times daily. Start with once daily, increase to twice daily if no improvement in bleeding. Patient not taking: Reported on 09/19/2018 04/30/18   Sloan Leiter, MD  metFORMIN (GLUCOPHAGE-XR) 500 MG 24 hr tablet Take 2 tablets (1,000 mg total) by mouth 2 (two) times daily. 10/01/18 12/30/18  Mullis, Kiersten P, DO  norethindrone (AYGESTIN) 5 MG tablet Take 1 tablet (5 mg total) by mouth daily. 10/05/18   Mullis, Kiersten P, DO  ONETOUCH DELICA LANCETS 32Z MISC Use up to twice daily testing. E11.9 04/01/18   [place] Bing, DO  terconazole (TERAZOL 3) 0.8 % vaginal cream Place 1 applicator vaginally at bedtime. Patient not taking: Reported on 09/19/2018 05/05/18   Sloan Leiter, MD  traMADol (ULTRAM) 50 MG tablet Take 1 tablet (50 mg total) by mouth every 6 (six) hours as needed. Patient not taking: Reported on 09/19/2018 12/14/16   [place] Leigh, MD  valACYclovir (VALTREX) 500 MG tablet Take 1 tablet (500 mg total) by mouth daily. Can increase to twice a day for 5 days in the event of a recurrence Patient taking differently: Take 500 mg by mouth See admin instructions. Take 500 mg by mouth once a day for 5 days when needed- may increase to 500 mg two times a day, as directed 04/30/18   Sloan Leiter, MD     Family History  Problem Relation Age of Onset  . Breast cancer Mother 98  . Prostate cancer Father 25    Social History   Socioeconomic History  . Marital status: Single    Spouse name: Not on file  . Number of children: Not on file  . Years of education: Not on file   . Highest education level: Not on file  Occupational History  . Not on file  Social Needs  . Financial resource strain: Not on file  . Food insecurity    Worry: Not on file    Inability: Not on file  . Transportation needs    Medical: Not on file    Non-medical: Not on file  Tobacco Use  . Smoking status: Never Smoker  . Smokeless tobacco: Never Used  Substance and Sexual Activity  . Alcohol use: No  . Drug use: No  . Sexual activity: Not Currently  Lifestyle  . Physical activity    Days per week: Not on file    Minutes per session: Not on file  . Stress: Not on file  Relationships  . Social Herbalist on phone:  Not on file    Gets together: Not on file    Attends religious service: Not on file    Active member of club or organization: Not on file    Attends meetings of clubs or organizations: Not on file    Relationship status: Not on file  Other Topics Concern  . Not on file  Social History Theme park manager. Three children: 2 daughters, 1 son, 6 grandchildren. She was married once for 4 years. Her children were from previous relationships. She works for [employer] She lives alone but has family that is close by.    Review of Systems  Constitutional: Negative.   Gastrointestinal: Positive for abdominal distention.  Genitourinary: Positive for menstrual problem and pelvic pain. Negative for difficulty urinating, dysuria and urgency.   Physical Exam No direct physical exam was performed  Vital Signs: There were no vitals taken for this visit.  Imaging: No results found.  Labs:  CBC: Recent Labs    09/20/18 0845 09/21/18 0200 09/22/18 0200 09/23/18 0315  WBC 4.5 7.4 9.5 9.3  HGB 9.6* 9.2* 9.2* 9.5*  HCT 34.5* 33.7* 33.9* 35.0*  PLT 307 313 349 356    COAGS: No results for input(s): INR, APTT in the last 8760 hours.  BMP: Recent Labs    09/21/18 0200 09/22/18 0200 09/23/18 0315 11/23/18 0849  NA 137 138 138 137  K 3.5 3.6 4.3  4.1  CL 103 104 108 102  CO2 _0 GLUCOSE 230* 134* 119* 131*  BUN _1 CALCIUM 8.6* 8.6* 8.4* 9.2  CREATININE 0.61 0.59 0.58 0.63  GFRNONAA >60 >60 >60 106  GFRAA >60 >60 >60 123    LIVER FUNCTION TESTS: Recent Labs    09/20/18 0845 09/21/18 0200 09/22/18 0200 09/23/18 0315  BILITOT 0.3 0.3 0.2* <0.1*  AST 14* 13* 12* 13*  ALT _2 ALKPHOS 45 45 45 45  PROT 7.3 6.9 6.9 6.8  ALBUMIN 3.6 3.4* 3.4* 3.5    TUMOR MARKERS: No results for input(s): AFPTM, CEA, CA199, CHROMGRNA in the last 8760 hours.  Assessment and Plan:  48 year old with uterine fibroids and menorrhagia.  Patient was scheduled to have a hysterectomy this summer but it was delayed due to hospitalization for COVID-19.  Patient's medical history is significant for poorly controlled diabetes which seems to be improving.  Patient is concerned about prolonged recovery following a hysterectomy and interested in alternative therapies.  We discussed the uterine artery embolization procedure in depth.  Explained how we get catheter access from either the groin and left wrist for treatment of bilateral uterine arteries.  We discussed the post procedure expectations with post embolic syndrome and the possible risks of bleeding, infection and vascular injury.  Explained that most patients spend the night in the hospital following the procedure for pain control.  I would anticipate the patient will be out of work for approximately 2 weeks.  Patient is concerned about fibroids recurring after the uterine artery embolization. I explained that fibroid recurrence is possible but not typically a problem for patients of her age, opposed to younger patients in their 66s or 78s.   I reviewed a CT of the abdomen / pelvis from 12/14/2016 and the patient has evidence of multiple uterine fibroids.  Based on the patient's symptoms and previous CT imaging, she is probably a candidate for uterine artery embolization  procedure.  She already had an endometrial biopsy which was  negative for malignancy.  She will need a pelvic MRI to further characterize the uterine fibroids and their enhancement pattern.  At this point, she would like to schedule a pelvic MRI to see if she is a candidate for the uterine artery embolization procedure.  We will schedule an MRI as soon as possible.  Thank you for this interesting consult.  I greatly enjoyed meeting Sierrah Luevano and look forward to participating in their care.  A copy of this report was sent to the requesting provider on this date.  Electronically Signed: Burman Riis 12/23/2018, 4:08 PM   I spent a total of  30 Minutes   in remote  clinical consultation, greater than 50% of which was counseling/coordinating care for uterine fibroids and menorrhagia.    Visit type: Audio only (telephone). Audio (no video) only due to patient preference.. Alternative for in-person consultation at [facility], [address], [place], [place]. This visit type was conducted due to national recommendations for restrictions regarding the COVID-19 Pandemic (e.g. social distancing).  This format is felt to be most appropriate for this patient at this time.  All issues noted in this document were discussed and addressed.

## 2018-12-24 ENCOUNTER — Encounter: Payer: Self-pay | Admitting: *Deleted

## 2018-12-24 ENCOUNTER — Other Ambulatory Visit: Payer: Self-pay | Admitting: Diagnostic Radiology

## 2018-12-24 ENCOUNTER — Other Ambulatory Visit: Payer: Self-pay | Admitting: Obstetrics and Gynecology

## 2018-12-24 DIAGNOSIS — D259 Leiomyoma of uterus, unspecified: Secondary | ICD-10-CM

## 2018-12-25 ENCOUNTER — Encounter: Payer: Self-pay | Admitting: Family Medicine

## 2018-12-25 ENCOUNTER — Other Ambulatory Visit: Payer: Self-pay

## 2018-12-25 ENCOUNTER — Ambulatory Visit (INDEPENDENT_AMBULATORY_CARE_PROVIDER_SITE_OTHER): Payer: 59 | Admitting: Family Medicine

## 2018-12-25 VITALS — BP 135/85 | HR 87 | Wt 183.4 lb

## 2018-12-25 DIAGNOSIS — E1169 Type 2 diabetes mellitus with other specified complication: Secondary | ICD-10-CM | POA: Diagnosis not present

## 2018-12-25 DIAGNOSIS — E785 Hyperlipidemia, unspecified: Secondary | ICD-10-CM

## 2018-12-25 DIAGNOSIS — I10 Essential (primary) hypertension: Secondary | ICD-10-CM | POA: Diagnosis not present

## 2018-12-25 DIAGNOSIS — E119 Type 2 diabetes mellitus without complications: Secondary | ICD-10-CM

## 2018-12-25 DIAGNOSIS — E039 Hypothyroidism, unspecified: Secondary | ICD-10-CM

## 2018-12-25 LAB — POCT GLYCOSYLATED HEMOGLOBIN (HGB A1C): HbA1c, POC (controlled diabetic range): 6.5 % (ref 0.0–7.0)

## 2018-12-25 NOTE — Patient Instructions (Addendum)
Thank you so much for coming in to see me today! It was so great to see you.  Your diabetes is much improved. Your Hemoglobin A1C improved from 10.5 to 6.5!!!! I am so proud of you!!   Lets continue the current medications and plan to see each other in 3 months. Continue to monitor diet and try to walk daily to reach that 5000 step goal.  Take care and come see me sooner if you have any concerns.  Dr. Tarry Kos

## 2018-12-25 NOTE — Assessment & Plan Note (Signed)
Last lipid panel in March 2020 was WNL. Currently on high intensity statin of Lipitor 40mg  QD. Tolerating well. - continue current regimen - plan to obtain lipid panel at follow up visit

## 2018-12-25 NOTE — Assessment & Plan Note (Signed)
BP slightly above goal today of <130/80. Normally better controlled. Complaint on Losartan 40mg  QD. Will continue this at this time. If continues to remain elevated at subsequent visits, will consider adjustment to medicines. - continue Losartan 40mg  QD - plan to check BMP at next appointment

## 2018-12-25 NOTE — Assessment & Plan Note (Signed)
A1C much improved from 10.5 to 6.5 today. Congratulated patient on this success. Denies any adverse medication effects on current regimen. Discussed diet and exercise. Currently on Statin and ARB. Nonsmoker. - Recommended trying to walk daily for more exercise.  - Continue current medications at this time - RTC in 3 months for diabetes follow up  - If A1C elevated in future, can consider increase in Trulicity to 1.5mg  or Jardiance 25mg  QD - Ambulatory referral placed for ophthalmology. Patient plans to make appointment ASAP

## 2018-12-25 NOTE — Progress Notes (Signed)
Subjective:   Patient ID: Valerie Bauer    DOB: 1970/03/16, 48 y.o. female   MRN: [id number]  Valerie Bauer is a 48 y.o. female with a history of HTN, DM, HLD, hypothyroidism, obesity, and iron deficiency anemia  here for diabetes follow up.  T2DM: Last A1C 10.5, today's A1C 6.5. CBG's range from 97-158. Currently on Trulicity 0.75mg  once weekly, Jardiance 10mg  QD, Metformin 1000mg  BID. Denies any hypoglycemia symptoms. Denies any polyuria, polydispia, polyphagia.  Weight increased from 174lb to 183lbs. She has set a 5000 step goal, but she usually obtains 2000-3000 steps a day. It is difficult for her to walk daily because she works a lot. She notes she eats lots of salads and baked/grilled foods. She tries to avoid regular sodas or sweet teas. She reaches for diet sodas or water. She may have a sugary drink 1x/week. Due for diabetic eye exam. She notes she had to postpone her appointment but is planning on looking for a doctor that is under her insurance and make an appointment as soon as possible.  HTN: BP 135/85 today. Currently on Losartan 50mg  QD. Endorses compliance. Denies any chest pain, SOB, headaches, or vision changes.   HLD: Last lipid panel in March 2020 notable for Chol 121, HDL 39, LDL 67, Trig 76. Currently on Lipitor 40mg  QD. Denies any muscle pains or other adverse side effects.  Hypothyroidism: Last TSH 2.52 in March 2020. Currently on Synthroid 138mcg QD. Endorses compliance. Denies any diarrhea/constipation, cold/heat intolerance or other signs/symptoms of hypo or hyperthyroidism.  Health Maintenance: Due for diabetic eye exam. Due for hepatitis C screen  Social:  Tobacco: nonsmoker Alcohol: Non drinker x 10 years.  Review of Systems:  Per HPI.   [place], medications and smoking status reviewed.  Objective:   BP 135/85   Pulse 87   Wt 183 lb 6.4 oz (83.2 kg)   SpO2 98%   BMI 30.52 kg/m  Vitals and nursing note reviewed.  General: well nourished,  well developed, in no acute distress with non-toxic appearance, sitting comfortably in exam chair  CV: regular rate and rhythm without murmurs, rubs, or gallops Lungs: clear to auscultation bilaterally with normal work of breathing, speaking in full sentences Skin: warm, dry Extremities: warm and well perfused MSK:  gait normal Neuro: Alert and oriented, speech normal  Assessment & Plan:   Primary hypertension BP slightly above goal today of <130/80. Normally better controlled. Complaint on Losartan 40mg  QD. Will continue this at this time. If continues to remain elevated at subsequent visits, will consider adjustment to medicines. - continue Losartan 40mg  QD - plan to check BMP at next appointment  Type 2 diabetes mellitus without complication, without long-term current use of insulin (HCC) A1C much improved from 10.5 to 6.5 today. Congratulated patient on this success. Denies any adverse medication effects on current regimen. Discussed diet and exercise. Currently on Statin and ARB. Nonsmoker. - Recommended trying to walk daily for more exercise.  - Continue current medications at this time - RTC in 3 months for diabetes follow up  - If A1C elevated in future, can consider increase in Trulicity to 1.5mg  or Jardiance 25mg  QD - Ambulatory referral placed for ophthalmology. Patient plans to make appointment ASAP  Hyperlipidemia associated with type 2 diabetes mellitus ([place]) Last lipid panel in March 2020 was WNL. Currently on high intensity statin of Lipitor 40mg  QD. Tolerating well. - continue current regimen - plan to obtain lipid panel at follow up visit  Hypothyroidism (acquired) Chronic. Compliant with medications. Last TSH WNL in March 2020. Denies any symptoms to indicate not well controlled. - continue Synthroid 135mcg QD - plan to recheck TSH at follow up visit in 3 months   Health Maintenance: - ambulatory referral to ophthalmology placed. Patient to make appointment ASAP  - declined Hep C screen  Orders Placed This Encounter  Procedures  . Ambulatory referral to Ophthalmology    Referral Priority:   Routine    Referral Type:   Consultation    Referral Reason:   Specialty Services Required    Requested Specialty:   Ophthalmology    Number of Visits Requested:   1  . HgB A1c   [name], DO PGY-2, [facility] 12/25/2018 9:51 AM

## 2018-12-25 NOTE — Assessment & Plan Note (Signed)
Chronic. Compliant with medications. Last TSH WNL in March 2020. Denies any symptoms to indicate not well controlled. - continue Synthroid 138mcg QD - plan to recheck TSH at follow up visit in 3 months

## 2018-12-31 ENCOUNTER — Other Ambulatory Visit: Payer: Self-pay

## 2018-12-31 ENCOUNTER — Ambulatory Visit (HOSPITAL_COMMUNITY)
Admission: RE | Admit: 2018-12-31 | Discharge: 2018-12-31 | Disposition: A | Discharge status: Home / Self Care | Payer: 59 | Source: Ambulatory Visit | Attending: Diagnostic Radiology | Admitting: Diagnostic Radiology

## 2018-12-31 DIAGNOSIS — D259 Leiomyoma of uterus, unspecified: Secondary | ICD-10-CM | POA: Diagnosis not present

## 2018-12-31 IMAGING — MR MR PELVIS WO/W CM
7 of 12 series · 18 of 48 positions shown · IV contrast (gadavist)
Comparison: Pelvic sonogram of [DATE]

CLINICAL DATA: History of uterine fibroids, pre uterine fibroid
embolization.

EXAM:
MRI PELVIS WITHOUT AND WITH CONTRAST
TECHNIQUE: Multiplanar multisequence MR imaging of the pelvis was performed
both before and after administration of intravenous contrast.
CONTRAST:  8mL GADAVIST GADOBUTROL 1 MMOL/ML IV SOLN

[Series 3: T2 · coronal · 5.0mm · 0.53mm/px · 3 of 39 slices shown (1 of 3)]
[im 1/39]
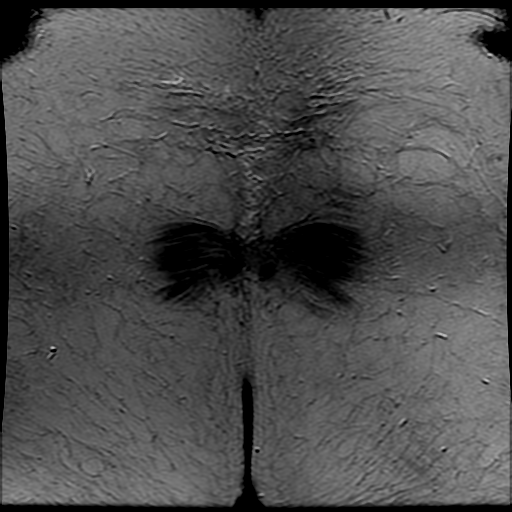
[im 20/39]
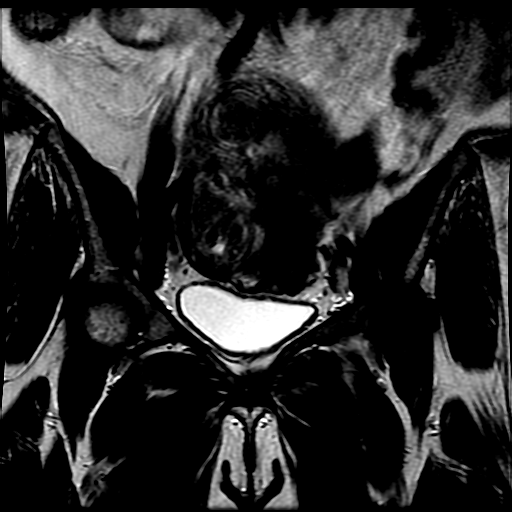
[im 39/39]
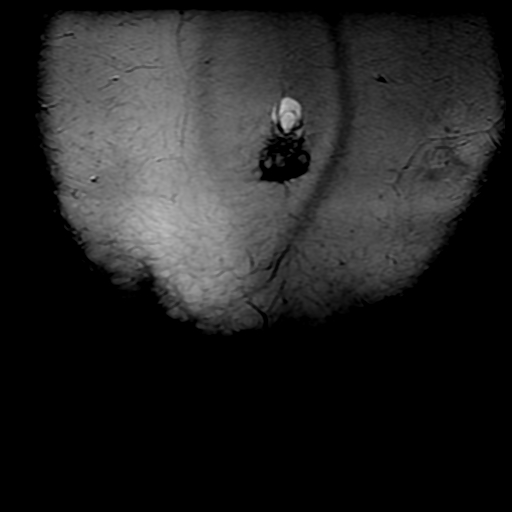

[Series 4: T2 · axial · 5.0mm · 0.47mm/px · z∈[-60,+186]mm · 3 of 42 slices shown (2 of 3)]
[im 1/42]
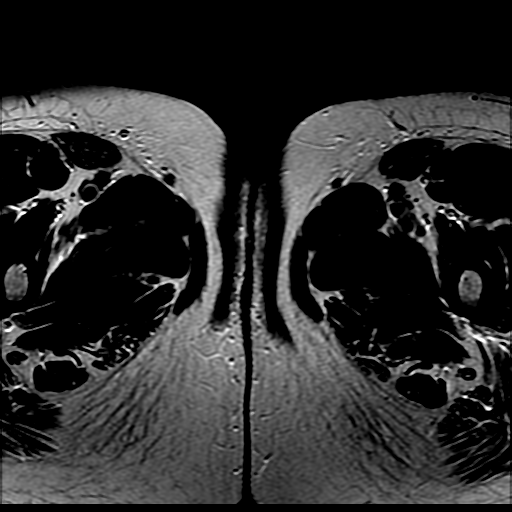
[im 21/42]
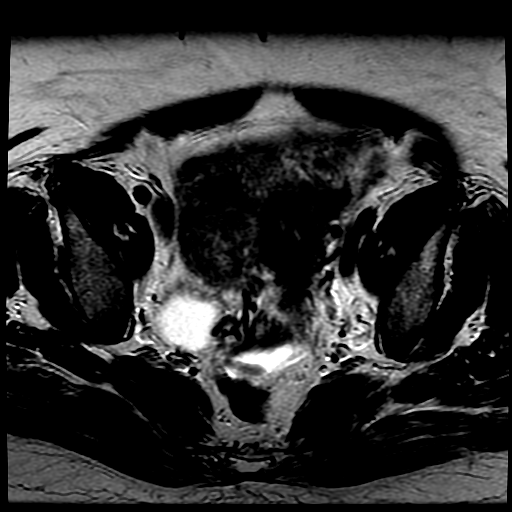
[im 42/42]
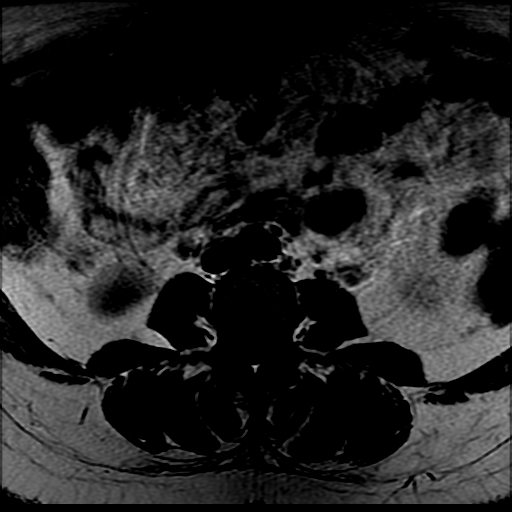

[Series 5: T2 fat-sat · axial · 5.0mm · 0.47mm/px · z∈[-60,+186]mm · 3 of 42 slices shown]
[im 1/42]
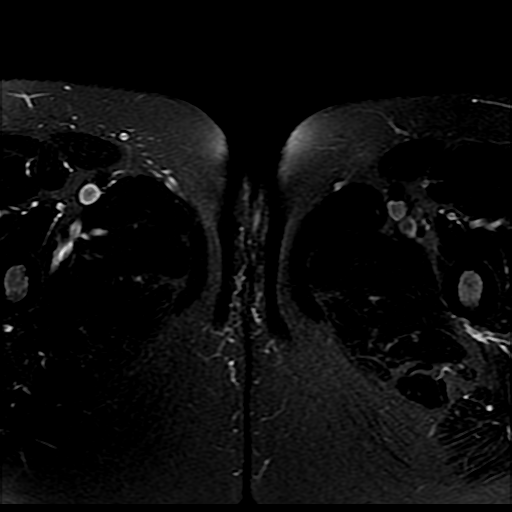
[im 21/42]
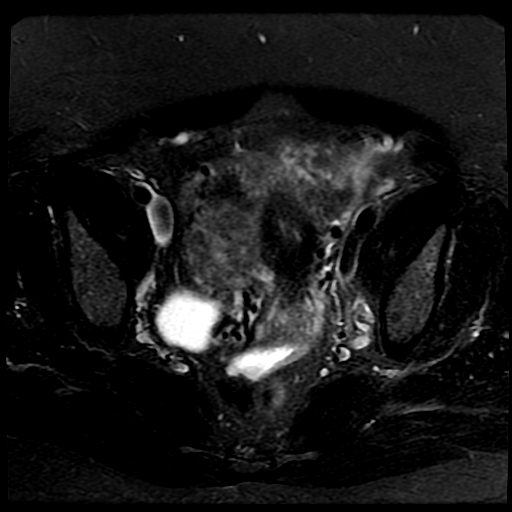
[im 42/42]
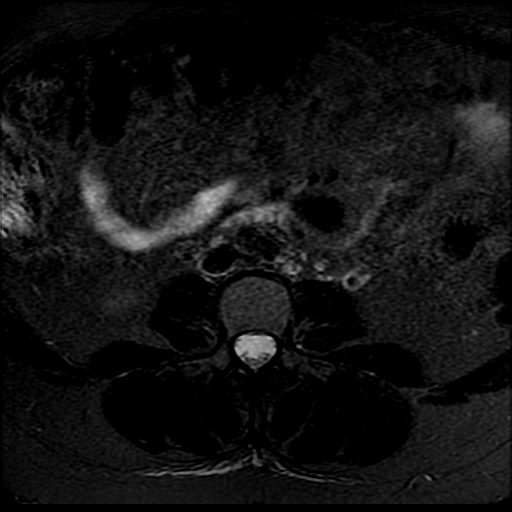

[Series 6: T2 · sagittal · 5.0mm · 0.51mm/px · 2 of 36 slices shown (3 of 3)]
[im 1/36]
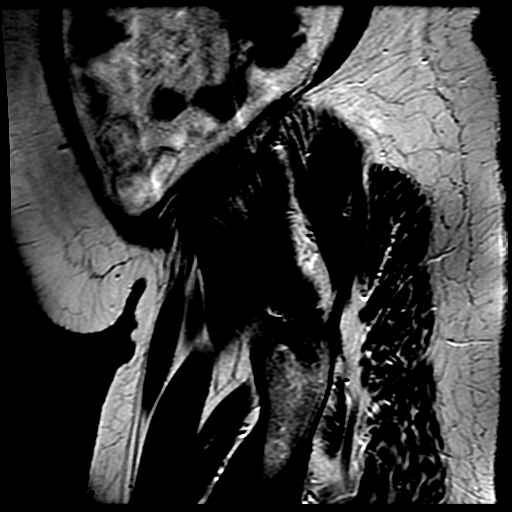
[im 36/36]
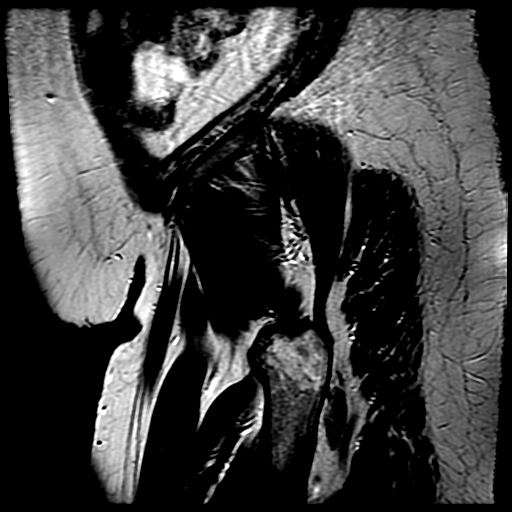

[Series 7: T1 · axial · 5.0mm · 0.47mm/px · z∈[-60,+186]mm · 3 of 42 slices shown]
[im 1/42]
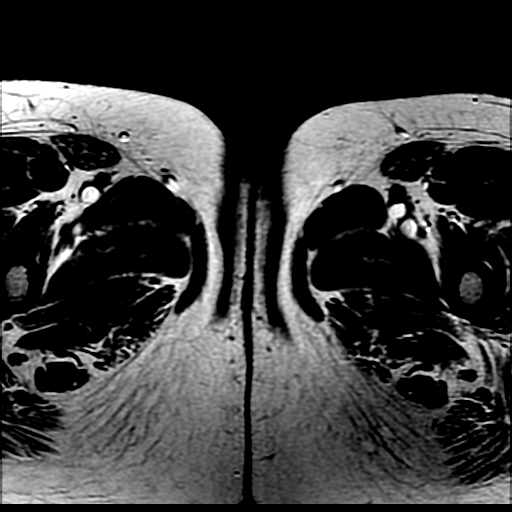
[im 21/42]
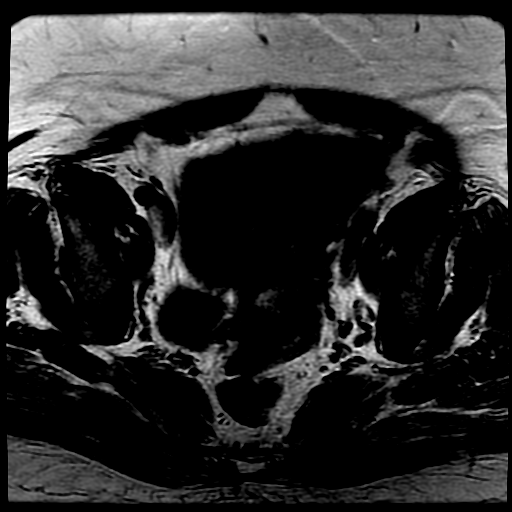
[im 42/42]
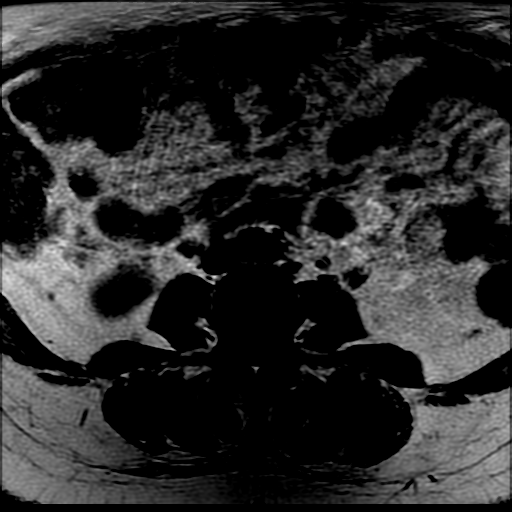

[Series 9: T2 post-contrast · sagittal · 5.0mm · 0.51mm/px · 2 of 36 slices shown]
[im 1/36]
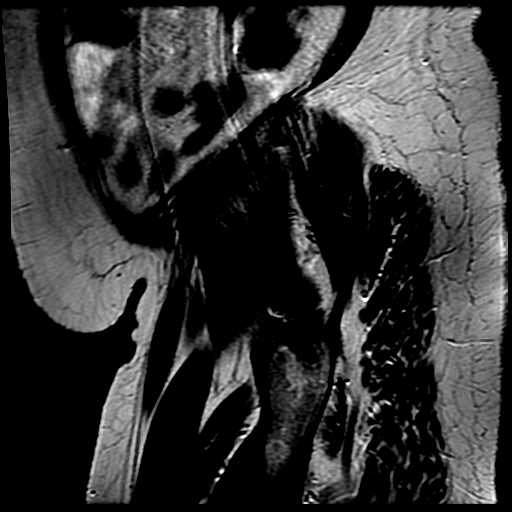
[im 36/36]
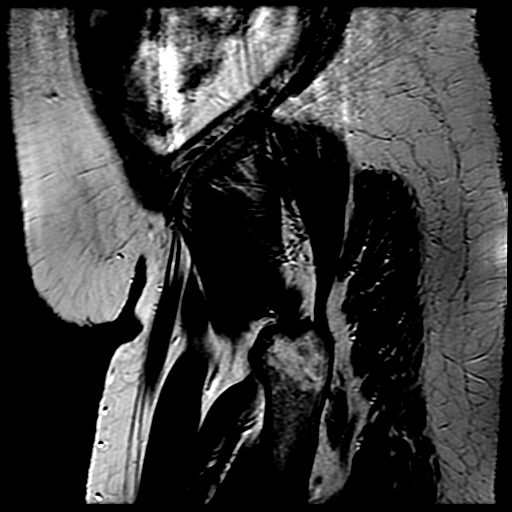

[Series 10: T1 fat-sat post-contrast · sagittal · 5.0mm · 0.51mm/px · 2 of 36 slices shown]
[im 1/36]
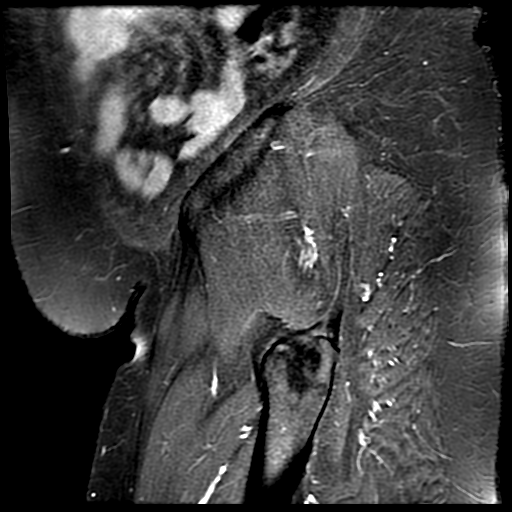
[im 36/36]
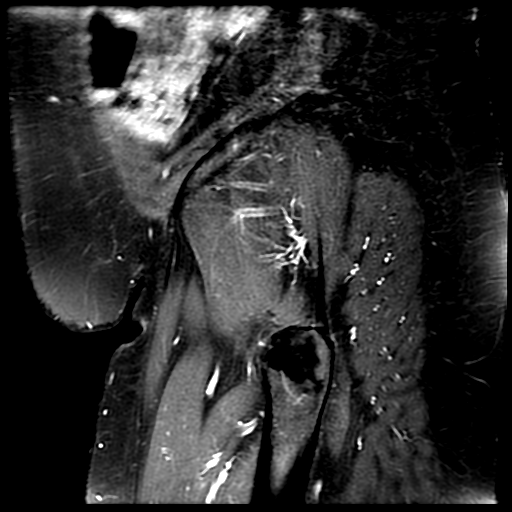

[18 of 48 positions shown; findings below may reference images not displayed]

FINDINGS: Urinary Tract: Urinary bladder is normal. There is no sign of distal
ureteral dilation.

Bowel: No sign of acute bowel process. Limited assessment of bowel
in the pelvis. Bowel loops are displaced by enlarged uterus.

Vascular/Lymphatic: No signs of vascular abnormality.

Reproductive: Markedly enlarged uterus. Multiple leiomyomata. Uterus
measuring 17 x 10 x 12 cm.

Junctional zone approximately 2.6 cm in greatest thickness in the
mid sagittal plane with irregular margins and low T2 signal. This
shows heterogeneous enhancement.

Polypoid area in the upper portion of the endometrial canal showing
enhancement measuring approximately 1.2 x 1 cm.

Leiomyomata:

Leiomyoma in the anterior uterine fundus with moderate increase in
T2 signal showing predominantly high to intermediate T2 signal and
areas of low T2 signal centrally and with a whirling pattern.
Central high T1 signal. The lesion measures 5.6 x 4.0 x 5.3 cm. This
is the largest leiomyoma in the uterus.

Posterior lower uterine segment a 4.6 x 4.5 x 4.8 cm leiomyoma.

Smaller posterior fundal leiomyoma approximately 4.3 x 3.3 cm. Above
leiomyomata are intramural.

Partially subserosal fibroid along the anterior left uterus measures
4.2 x 2.8 x 4.4 cm, mainly intramural and with similar signal
characteristics to above leiomyomata.

Leiomyoma with approximately 30-40% subserosal component and very
low T2 signal in the anterior uterus in the midportion, eccentric to
the left measuring 2.6 x 2.0 cm. At least 3 or 4 additional tiny
leiomyomata with low signal demonstrated in the myometrium.

Irregularity of the endometrial/myometrial interface as well, this
occurs in the mid uterus just above the subserosal fibroid.

Ovaries are unremarkable.

Other:  No signs of ascites or adenopathy.

Musculoskeletal: No signs of acute or destructive bone process.
IMPRESSION: 1. Markedly enlarged uterus with multiple leiomyomata, as above.
Dominant leiomyomata are undergoing degeneration but still display
signs of enhancement.
2. Polypoid area in the upper portion of the endometrial canal,
potentially a small polyp; however, along with
endometrial/junctional zone irregularity in the mid uterus, best
seen on sagittal image 21 of series 6, neoplasm is not excluded in
this patient with diffuse adenomyosis which can make staging and
detection of endometrial neoplasm difficult.
3. No signs of adenopathy.
4. No sign of distal ureteral dilation.

## 2018-12-31 MED ORDER — GADOBUTROL 1 MMOL/ML IV SOLN
8.0000 mL | Freq: Once | INTRAVENOUS | Status: AC | PRN
  Administered 2018-12-31: 8 mL via INTRAVENOUS

## 2018-12-31 NOTE — Progress Notes (Unsigned)
Received call from [place] in Radiology & she wanted to make Sure that Dr. Rosana Hoes sees pt's MRI Pelvic exam.

## 2019-01-07 ENCOUNTER — Telehealth: Payer: Self-pay | Admitting: Diagnostic Radiology

## 2019-01-07 NOTE — Progress Notes (Signed)
Reviewed pelvic MRI for menorrhagia and fibroids.  Multiple uterine fibroids along with a small polypoid structure, likely a polyp.  Discussed with Dr. Rosana Hoes and will plan for another endometrial biopsy.  If the biopsy is negative for neoplasia, then patient will be a candidate for embolization.  Explained MRI results to patient and she is agreeable to the plan.

## 2019-01-08 ENCOUNTER — Other Ambulatory Visit (HOSPITAL_COMMUNITY)
Admission: RE | Admit: 2019-01-08 | Discharge: 2019-01-08 | Disposition: A | Discharge status: Home / Self Care | Payer: 59 | Source: Ambulatory Visit | Attending: Obstetrics and Gynecology | Admitting: Obstetrics and Gynecology

## 2019-01-08 ENCOUNTER — Encounter: Payer: Self-pay | Admitting: Obstetrics and Gynecology

## 2019-01-08 ENCOUNTER — Ambulatory Visit (INDEPENDENT_AMBULATORY_CARE_PROVIDER_SITE_OTHER): Payer: 59 | Admitting: Obstetrics and Gynecology

## 2019-01-08 ENCOUNTER — Other Ambulatory Visit: Payer: Self-pay

## 2019-01-08 VITALS — BP 132/76 | HR 88 | Temp 98.5°F | Ht 60.0 in | Wt 181.4 lb

## 2019-01-08 DIAGNOSIS — Z3202 Encounter for pregnancy test, result negative: Secondary | ICD-10-CM

## 2019-01-08 DIAGNOSIS — N939 Abnormal uterine and vaginal bleeding, unspecified: Secondary | ICD-10-CM

## 2019-01-08 DIAGNOSIS — N840 Polyp of corpus uteri: Secondary | ICD-10-CM | POA: Insufficient documentation

## 2019-01-08 DIAGNOSIS — D250 Submucous leiomyoma of uterus: Secondary | ICD-10-CM

## 2019-01-08 LAB — POCT PREGNANCY, URINE: Preg Test, Ur: NEGATIVE

## 2019-01-08 NOTE — Progress Notes (Signed)
ENDOMETRIAL BIOPSY      Valerie Bauer is a 48 y.o. [phone] here for endometrial biopsy.  The indications for endometrial biopsy were reviewed.  Risks of the biopsy including cramping, bleeding, infection, uterine perforation, inadequate specimen and need for additional procedures were discussed. The patient states she understands and agrees to undergo procedure today. Consent was signed. Time out was performed.   Indications: polyp noted on MRI Urine HCG: negative  A bivalve speculum was placed into the vagina and the cervix was easily visualized and was prepped with Betadine x2. A single-toothed tenaculum was placed on the anterior lip of the cervix to stabilize it. The 3 mm pipelle was introduced into the endometrial cavity without difficulty to a depth of 14 cm, and a moderate amount of tissue was obtained and sent to pathology. This was repeated for a total of 4 passes. The instruments were removed from the patient's vagina. Minimal bleeding from the cervix at the tenaculum was noted, improved with silver nitrate.  The patient tolerated the procedure well. Routine post-procedure instructions were given to the patient.    Will base further management on results of biopsy.  Feliz Beam, M.D. Attending Center for [employer] farm manager)

## 2019-01-08 NOTE — Progress Notes (Signed)
s  GYNECOLOGY OFFICE FOLLOW UP NOTE  History:  48 y.o. [id number] here today for follow up for endometrial biopsy. H/o heavy periods, would like definitive management. Has seen IR to be evaluated for possible uterine artery embolization. They feel she is a candidate but has a endometrial polyp noted on MRI. Here for discussion regarding [place] vs hysterectomy and EMB of polyp prior to [place].    Past Medical History:  Diagnosis Date   Diabetes mellitus without complication ([place]) 2355   HSV infection 2011   dx 2011   Hypertension 2017    Past Surgical History:  Procedure Laterality Date   BREAST BIOPSY Left 11/20/2009   CRYOABLATION  2014   cervical cryoablation ~ 2014   IR RADIOLOGIST EVAL & MGMT  12/23/2018   TUBAL LIGATION  1999     Current Outpatient Medications:    acetaminophen (TYLENOL) 500 MG tablet, Take 500 mg by mouth every 6 (six) hours as needed for mild pain or fever., Disp: , Rfl:    atorvastatin (LIPITOR) 40 MG tablet, Take 1 tablet (40 mg total) by mouth daily., Disp: 90 tablet, Rfl: 3   blood glucose meter kit and supplies KIT, Dispense based on patient and insurance preference. Use up to four times daily as directed. (FOR ICD-9 250.00, 250.01). For QAC - HS accuchecks., Disp: 1 each, Rfl: 1   Dulaglutide (TRULICITY) 7.32 KG/2.5KY SOPN, Inject 0.75 mg into the skin once a week., Disp: 4 pen, Rfl: 1   empagliflozin (JARDIANCE) 10 MG TABS tablet, Take 10 mg by mouth daily., Disp: 90 tablet, Rfl: 3   ferrous sulfate 325 (65 FE) MG EC tablet, Take 1 tablet (325 mg total) by mouth 3 (three) times daily with meals. (Patient taking differently: Take 325 mg by mouth daily with breakfast. ), Disp: 90 tablet, Rfl: 3   glucose blood (FREESTYLE LITE) test strip, For glucose testing every before meals at bedtime. Diagnosis E 11.65  Can substitute to any accepted brand, Disp: 100 each, Rfl: 0   glucose blood (ONETOUCH VERIO) test strip, Use up to twice daily testing. E11.9,  Disp: 100 each, Rfl: 12   Lancet Devices (ONE TOUCH DELICA LANCING DEV) MISC, Use up to twice daily testing. E11.9, Disp: 100 each, Rfl: 3   levothyroxine (SYNTHROID, LEVOTHROID) 125 MCG tablet, Take 1 tablet (125 mcg total) by mouth daily., Disp: 90 tablet, Rfl: 3   losartan (COZAAR) 50 MG tablet, Take 1 tablet (50 mg total) by mouth daily., Disp: 90 tablet, Rfl: 3   ONETOUCH DELICA LANCETS 70W MISC, Use up to twice daily testing. E11.9, Disp: 100 each, Rfl: 3   valACYclovir (VALTREX) 500 MG tablet, Take 1 tablet (500 mg total) by mouth daily. Can increase to twice a day for 5 days in the event of a recurrence (Patient taking differently: Take 500 mg by mouth See admin instructions. Take 500 mg by mouth once a day for 5 days when needed- may increase to 500 mg two times a day, as directed), Disp: 30 tablet, Rfl: 12   metFORMIN (GLUCOPHAGE-XR) 500 MG 24 hr tablet, Take 2 tablets (1,000 mg total) by mouth 2 (two) times daily., Disp: 360 tablet, Rfl: 2  The following portions of the patient's history were reviewed and updated as appropriate: allergies, current medications, past family history, past medical history, past social history, past surgical history and problem list.   Review of Systems:  Pertinent items noted in HPI and remainder of comprehensive ROS otherwise negative.   Objective:  Physical Exam BP 132/76    Pulse 88    Temp 98.5 F (36.9 C)    Ht 5' (1.524 m)    Wt 181 lb 6.4 oz (82.3 kg)    LMP 01/05/2019 (Exact Date)    BMI 35.43 kg/m  CONSTITUTIONAL: Well-developed, well-nourished female in no acute distress.  HENT:  Normocephalic, atraumatic. External right and left ear normal. Oropharynx is clear and moist EYES: Conjunctivae and EOM are normal. Pupils are equal, round, and reactive to light. No scleral icterus.  NECK: Normal range of motion, supple, no masses SKIN: Skin is warm and dry. No rash noted. Not diaphoretic. No erythema. No pallor. NEUROLOGIC: Alert and oriented  to person, place, and time. Normal reflexes, muscle tone coordination. No cranial nerve deficit noted. PSYCHIATRIC: Normal mood and affect. Normal behavior. Normal judgment and thought content. CARDIOVASCULAR: Normal heart rate noted RESPIRATORY: Effort normal, no problems with respiration noted ABDOMEN: Soft, no distention noted.   PELVIC: Normal appearing external genitalia; normal appearing vaginal mucosa and cervix.  No abnormal discharge noted.  EMD done, see procedure note. MUSCULOSKELETAL: Normal range of motion. No edema noted.  Exam done with chaperone present.  Labs and Imaging Mr Pelvis W Wo Contrast  Result Date: 12/31/2018 CLINICAL DATA:  History of uterine fibroids, pre uterine fibroid embolization. EXAM: MRI PELVIS WITHOUT AND WITH CONTRAST TECHNIQUE: Multiplanar multisequence MR imaging of the pelvis was performed both before and after administration of intravenous contrast. CONTRAST:  24m GADAVIST GADOBUTROL 1 MMOL/ML IV SOLN COMPARISON:  Pelvic sonogram of 05/07/2018 FINDINGS: Urinary Tract: Urinary bladder is normal. There is no sign of distal ureteral dilation. Bowel: No sign of acute bowel process. Limited assessment of bowel in the pelvis. Bowel loops are displaced by enlarged uterus. Vascular/Lymphatic: No signs of vascular abnormality. Reproductive: Markedly enlarged uterus. Multiple leiomyomata. Uterus measuring 17 x 10 x 12 cm. Junctional zone approximately 2.6 cm in greatest thickness in the mid sagittal plane with irregular margins and low T2 signal. This shows heterogeneous enhancement. Polypoid area in the upper portion of the endometrial canal showing enhancement measuring approximately 1.2 x 1 cm. Leiomyomata: Leiomyoma in the anterior uterine fundus with moderate increase in T2 signal showing predominantly high to intermediate T2 signal and areas of low T2 signal centrally and with a whirling pattern. Central high T1 signal. The lesion measures 5.6 x 4.0 x 5.3 cm. This  is the largest leiomyoma in the uterus. Posterior lower uterine segment a 4.6 x 4.5 x 4.8 cm leiomyoma. Smaller posterior fundal leiomyoma approximately 4.3 x 3.3 cm. Above leiomyomata are intramural. Partially subserosal fibroid along the anterior left uterus measures 4.2 x 2.8 x 4.4 cm, mainly intramural and with similar signal characteristics to above leiomyomata. Leiomyoma with approximately 30-40% subserosal component and very low T2 signal in the anterior uterus in the midportion, eccentric to the left measuring 2.6 x 2.0 cm. At least 3 or 4 additional tiny leiomyomata with low signal demonstrated in the myometrium. Irregularity of the endometrial/myometrial interface as well, this occurs in the mid uterus just above the subserosal fibroid. Ovaries are unremarkable. Other:  No signs of ascites or adenopathy. Musculoskeletal: No signs of acute or destructive bone process. IMPRESSION: 1. Markedly enlarged uterus with multiple leiomyomata, as above. Dominant leiomyomata are undergoing degeneration but still display signs of enhancement. 2. Polypoid area in the upper portion of the endometrial canal, potentially a small polyp; however, along with endometrial/junctional zone irregularity in the mid uterus, best seen on sagittal image 21 of  series 6, neoplasm is not excluded in this patient with diffuse adenomyosis which can make staging and detection of endometrial neoplasm difficult. 3. No signs of adenopathy. 4. No sign of distal ureteral dilation. Electronically Signed   By: Zetta Bills M.D.   On: 12/31/2018 11:22   Ir Radiologist Eval & Mgmt  Result Date: 12/24/2018 Please refer to notes tab for details about interventional procedure. (Op Note)   Assessment & Plan:   1. Endometrial polyp - likely benign, last EMB in 05/2018 benign, however as she is to undergo [place], will rebiopsy - EMB done today, please see procedure note for details - Surgical pathology( [place])  2. Abnormal  uterine bleeding (AUB) - Reviewed risks/benefits of hysterectomy vs [place] with patient, she has also seen IR and reviewed risks/benefits of [place] with them. She would prefer to proceed with [place], understands she may have pain, bleeding. May improve her bleeding but not stop it altogether. May eventually need hysterectomy if [place] does not improve her symptoms enough. She affirms understanding and desires to proceed with [place]. Will get EMB today and have her scheduled with IR.  3. Intramural and submucous leiomyoma of uterus See above   Routine preventative health maintenance measures emphasized. Please refer to After Visit Summary for other counseling recommendations.   Return in about 3 months (around 04/10/2019) for Followup.  Total face-to-face time with patient: 25 minutes. Over 50% of encounter was spent on counseling and coordination of care.  Feliz Beam, M.D. Attending Center for [employer] manager)

## 2019-01-12 LAB — SURGICAL PATHOLOGY

## 2019-01-19 ENCOUNTER — Encounter: Payer: Self-pay | Admitting: *Deleted

## 2019-01-25 ENCOUNTER — Other Ambulatory Visit: Payer: Self-pay | Admitting: Interventional Radiology

## 2019-01-26 ENCOUNTER — Other Ambulatory Visit (HOSPITAL_COMMUNITY): Payer: Self-pay | Admitting: Diagnostic Radiology

## 2019-01-26 DIAGNOSIS — D259 Leiomyoma of uterus, unspecified: Secondary | ICD-10-CM

## 2019-02-01 ENCOUNTER — Telehealth: Payer: Self-pay | Admitting: Diagnostic Radiology

## 2019-02-01 ENCOUNTER — Inpatient Hospital Stay (HOSPITAL_COMMUNITY): Admission: RE | Admit: 2019-02-01 | Payer: 59 | Source: Ambulatory Visit

## 2019-02-01 ENCOUNTER — Telehealth: Payer: Self-pay | Admitting: Obstetrics and Gynecology

## 2019-02-01 NOTE — Progress Notes (Signed)
Patient informed us that she just started her period and was scheduled for uterine artery embolization this week.  Explained to patient that we can still perform the [place] while on her period but most patient's prefer to wait because it can be a more difficult recovery while on her period. Patient has already contacted Dr. Rosana Hoes and making arrangements for a hysterectomy.  [place] is cancelled for now.

## 2019-02-01 NOTE — Telephone Encounter (Signed)
Called patient to discuss possible cancellation of TAH, BS next week due to rising COVID 19 numbers and potential cancellation of elective surgeries. She has started her period and not eligible for [place] this week. She would like to still try for TAH next week as scheduled, and if unable to be completed due to cancellation of case, will get her on schedule as possible for [place] in future. She is in agreement with plan. Answered all questions.   Feliz Beam, M.D. Attending Center for [employer] farm manager)

## 2019-02-04 ENCOUNTER — Ambulatory Visit (INDEPENDENT_AMBULATORY_CARE_PROVIDER_SITE_OTHER): Payer: 59 | Admitting: Obstetrics and Gynecology

## 2019-02-04 ENCOUNTER — Other Ambulatory Visit (HOSPITAL_COMMUNITY): Payer: 59

## 2019-02-04 ENCOUNTER — Encounter: Payer: Self-pay | Admitting: Obstetrics and Gynecology

## 2019-02-04 ENCOUNTER — Other Ambulatory Visit: Payer: Self-pay

## 2019-02-04 ENCOUNTER — Ambulatory Visit (HOSPITAL_COMMUNITY): Payer: 59

## 2019-02-04 ENCOUNTER — Encounter: Payer: Self-pay | Admitting: *Deleted

## 2019-02-04 VITALS — BP 128/79 | HR 85 | Wt 180.4 lb

## 2019-02-04 DIAGNOSIS — N939 Abnormal uterine and vaginal bleeding, unspecified: Secondary | ICD-10-CM

## 2019-02-04 DIAGNOSIS — D252 Subserosal leiomyoma of uterus: Secondary | ICD-10-CM | POA: Diagnosis not present

## 2019-02-04 DIAGNOSIS — D250 Submucous leiomyoma of uterus: Secondary | ICD-10-CM | POA: Diagnosis not present

## 2019-02-04 DIAGNOSIS — R102 Pelvic and perineal pain: Secondary | ICD-10-CM | POA: Diagnosis not present

## 2019-02-04 NOTE — Progress Notes (Signed)
GYNECOLOGY OFFICE FOLLOW UP NOTE  History:  48 y.o. [id number] here today for follow up for AUB. Still having heavy periods, last period was this past week. With pelvic pain and pressure. She is ready for definitive management in the form of hysterectomy.  She has been working hard on managing her diabetes and has improved her A1c to 6.5. States BP is under good control as well.  Past Medical History:  Diagnosis Date  . Diabetes mellitus without complication ([place]) 7867  . HSV infection 2011   dx 2011  . Hypertension 2017    Past Surgical History:  Procedure Laterality Date  . BREAST BIOPSY Left 11/20/2009  . CRYOABLATION  2014   cervical cryoablation ~ 2014  . IR RADIOLOGIST EVAL & MGMT  12/23/2018  . TUBAL LIGATION  1999     Current Outpatient Medications:  .  acetaminophen (TYLENOL) 500 MG tablet, Take 500 mg by mouth every 6 (six) hours as needed for mild pain or fever., Disp: , Rfl:  .  atorvastatin (LIPITOR) 40 MG tablet, Take 1 tablet (40 mg total) by mouth daily., Disp: 90 tablet, Rfl: 3 .  blood glucose meter kit and supplies KIT, Dispense based on patient and insurance preference. Use up to four times daily as directed. (FOR ICD-9 250.00, 250.01). For QAC - HS accuchecks., Disp: 1 each, Rfl: 1 .  Dulaglutide (TRULICITY) 6.72 CN/4.7SJ SOPN, Inject 0.75 mg into the skin once a week., Disp: 4 pen, Rfl: 1 .  empagliflozin (JARDIANCE) 10 MG TABS tablet, Take 10 mg by mouth daily., Disp: 90 tablet, Rfl: 3 .  ferrous sulfate 325 (65 FE) MG EC tablet, Take 1 tablet (325 mg total) by mouth 3 (three) times daily with meals. (Patient taking differently: Take 325 mg by mouth daily with breakfast. ), Disp: 90 tablet, Rfl: 3 .  glucose blood (FREESTYLE LITE) test strip, For glucose testing every before meals at bedtime. Diagnosis E 11.65  Can substitute to any accepted brand, Disp: 100 each, Rfl: 0 .  glucose blood (ONETOUCH VERIO) test strip, Use up to twice daily testing. E11.9, Disp: 100  each, Rfl: 12 .  Lancet Devices (ONE TOUCH DELICA LANCING DEV) MISC, Use up to twice daily testing. E11.9, Disp: 100 each, Rfl: 3 .  levothyroxine (SYNTHROID, LEVOTHROID) 125 MCG tablet, Take 1 tablet (125 mcg total) by mouth daily., Disp: 90 tablet, Rfl: 3 .  losartan (COZAAR) 50 MG tablet, Take 1 tablet (50 mg total) by mouth daily., Disp: 90 tablet, Rfl: 3 .  metFORMIN (GLUCOPHAGE) 1000 MG tablet, Take 1,000 mg by mouth 2 (two) times daily with a meal., Disp: , Rfl:  .  ONETOUCH DELICA LANCETS 62E MISC, Use up to twice daily testing. E11.9, Disp: 100 each, Rfl: 3 .  valACYclovir (VALTREX) 500 MG tablet, Take 1 tablet (500 mg total) by mouth daily. Can increase to twice a day for 5 days in the event of a recurrence (Patient taking differently: Take 500 mg by mouth daily as needed (outbreaks). ), Disp: 30 tablet, Rfl: 12  The following portions of the patient's history were reviewed and updated as appropriate: allergies, current medications, past family history, past medical history, past social history, past surgical history and problem list.   Review of Systems:  Pertinent items noted in HPI and remainder of comprehensive ROS otherwise negative.   Objective:  Physical Exam BP 128/79   Pulse 85   Wt 180 lb 6.4 oz (81.8 kg)   LMP 01/30/2019  BMI 35.23 kg/m  CONSTITUTIONAL: Well-developed, well-nourished female in no acute distress.  HENT:  Normocephalic, atraumatic. External right and left ear normal. Oropharynx is clear and moist EYES: Conjunctivae and EOM are normal. Pupils are equal, round, and reactive to light. No scleral icterus.  NECK: Normal range of motion, supple, no masses SKIN: Skin is warm and dry. No rash noted. Not diaphoretic. No erythema. No pallor. NEUROLOGIC: Alert and oriented to person, place, and time. Normal reflexes, muscle tone coordination. No cranial nerve deficit noted. PSYCHIATRIC: Normal mood and affect. Normal behavior. Normal judgment and thought  content. CARDIOVASCULAR: Normal heart rate noted RESPIRATORY: Effort normal, no problems with respiration noted ABDOMEN: Soft, no distention noted. Uterine fundus to umbilicus. PELVIC: deferred MUSCULOSKELETAL: Normal range of motion. No edema noted.  Labs and Imaging No results found.  Pap 04/30/18: negative, positive hr HPV (neg 16/18/45) Endo Bx 06/01/18: benign Endo Bx 01/08/19: Benign  Assessment & Plan:   1. Pelvic pain  2. Submucous and subserous leiomyoma of uterus  3. Abnormal uterine bleeding (AUB) Patient seen for pre-op appt for TAH, BS. She has ongoing heavy menses, pelvic pain and enlarged fibroid uterus. She is ready for definitive management.  Reviewed recommendation for abdominal hysterectomy, given size of uterus, would be difficult to do minimally invasive procedure. The risks of abdominal hysterectomy were discussed with the patient; including but not limited to: infection which may require antibiotics; bleeding which may require transfusion or re-operation; injury to bowel, bladder, ureters or other surrounding organs; need for additional procedures, incisional problems, thromboembolic phenomenon and other postoperative/anesthesia complications. Reviewed that with a hysterectomy, she will not be able to have children in the future. Reviewed recommendation to remove fallopian tubes to reduce risk of ovarian cancer, but as she is 2, would not recommend removal of ovaries at this time. Reviewed that ovaries may be removed if they are abnormal appearing, anatomy requires it or if there is damage to blood supply. Reviewed that removal of ovaries would put her into surgical menopause and we will attempt to avoid this if possible.   Reviewed expected post surgery course and hospital stay.  Patient verbalized understanding of the above and consents to blood transfusion in the event of a life-threatening hemorrhage. Answered all questions.   She has a medical history of DM, HTN,  hyperlipidemia, hypothryoidism and will need clearance by anesthesia.   Routine preventative health maintenance measures emphasized. Please refer to After Visit Summary for other counseling recommendations.   Return in about 3 weeks (around 02/25/2019) for Followup, in person.  Total face-to-face time with patient: 20 minutes. Over 50% of encounter was spent on counseling and coordination of care.  Feliz Beam, M.D. Attending Center for [employer] manager)

## 2019-02-04 NOTE — Patient Instructions (Signed)
Continue the Trulicity as scheduled. Do not take Jardiance the day of surgery. You may take your other medications as usual.

## 2019-02-04 NOTE — Progress Notes (Signed)
Hysterectomy statement signed at today's visit.   Apolonio Schneiders RN 02/04/19

## 2019-02-04 NOTE — Progress Notes (Signed)
[place] ([place]), [place] - [address] [place] ([place]) [place] [address] Phone: [phone] Fax: [phone]      Your procedure is scheduled on December 22  Report to [facility] Main Entrance "A" at 0930 A.M., and check in at the Admitting office.  Call this number if you have problems the morning of surgery:  [phone]  Call [phone] if you have any questions prior to your surgery date Monday-Friday 8am-4pm    Remember:  Do not eat  after midnight the night before your surgery  You may drink clear liquids until 0830 the morning of your surgery.   Clear liquids allowed are: Water, Non-Citrus Juices (without pulp), Carbonated Beverages, Clear Tea, Black Coffee Only, and Gatorade    Take these medicines the morning of surgery with A SIP OF WATER  acetaminophen (TYLENOL if needed atorvastatin (LIPITOR) levothyroxine (SYNTHROID, LEVOTHROID valACYclovir (VALTREX) if needed  7 days prior to surgery STOP taking any Aspirin (unless otherwise instructed by your surgeon), Aleve, Naproxen, Ibuprofen, Motrin, Advil, Goody's, BC's, all herbal medications, fish oil, and all vitamins.   WHAT DO I DO ABOUT MY DIABETES MEDICATION?   Marland Kitchen Do not take oral diabetes medicines (pills) the morning of surgery. empagliflozin (JARDIANCE) and metFORMIN (GLUCOPHAGE)  . The day of surgery, do not take other diabetes injectables, including Byetta (exenatide), Bydureon (exenatide ER), Victoza (liraglutide), or Trulicity (dulaglutide).  . If your CBG is greater than 220 mg/dL, you may take  of your sliding scale (correction) dose of insulin.   HOW TO MANAGE YOUR DIABETES BEFORE AND AFTER SURGERY  Why is it important to control my blood sugar before and after surgery? . Improving blood sugar levels before and after surgery helps healing and can limit problems. . A way of improving blood sugar control is eating a healthy diet by: o  Eating  less sugar and carbohydrates o  Increasing activity/exercise o  Talking with your doctor about reaching your blood sugar goals . High blood sugars (greater than 180 mg/dL) can raise your risk of infections and slow your recovery, so you will need to focus on controlling your diabetes during the weeks before surgery. . Make sure that the doctor who takes care of your diabetes knows about your planned surgery including the date and location.  How do I manage my blood sugar before surgery? . Check your blood sugar at least 4 times a day, starting 2 days before surgery, to make sure that the level is not too high or low. . Check your blood sugar the morning of your surgery when you wake up and every 2 hours until you get to the Short Stay unit. o If your blood sugar is less than 70 mg/dL, you will need to treat for low blood sugar: - Do not take insulin. - Treat a low blood sugar (less than 70 mg/dL) with  cup of clear juice (cranberry or apple), 4 glucose tablets, OR glucose gel. - Recheck blood sugar in 15 minutes after treatment (to make sure it is greater than 70 mg/dL). If your blood sugar is not greater than 70 mg/dL on recheck, call [phone] for further instructions. . Report your blood sugar to the short stay nurse when you get to Short Stay.  . If you are admitted to the hospital after surgery: o Your blood sugar will be checked by the staff and you will probably be given insulin after surgery (instead of oral diabetes medicines) to  make sure you have good blood sugar levels. o The goal for blood sugar control after surgery is 80-180 mg/dL.   The Morning of Surgery  Do not wear jewelry, make-up or nail polish.  Do not wear lotions, powders, or perfumes/colognes, or deodorant  Do not shave 48 hours prior to surgery.  Men may shave face and neck.  Do not bring valuables to the hospital.  [facility] is not responsible for any belongings or valuables.  If you are a smoker, DO NOT  Smoke 24 hours prior to surgery  If you wear a CPAP at night please bring your mask, tubing, and machine the morning of surgery   Remember that you must have someone to transport you home after your surgery, and remain with you for 24 hours if you are discharged the same day.   Please bring cases for contacts, glasses, hearing aids, dentures or bridgework because it cannot be worn into surgery.    Leave your suitcase in the car.  After surgery it may be brought to your room.  For patients admitted to the hospital, discharge time will be determined by your treatment team.  Patients discharged the day of surgery will not be allowed to drive home.    Special instructions:   [place]- Preparing For Surgery  Before surgery, you can play an important role. Because skin is not sterile, your skin needs to be as free of germs as possible. You can reduce the number of germs on your skin by washing with CHG (chlorahexidine gluconate) Soap before surgery.  CHG is an antiseptic cleaner which kills germs and bonds with the skin to continue killing germs even after washing.    Oral Hygiene is also important to reduce your risk of infection.  Remember - BRUSH YOUR TEETH THE MORNING OF SURGERY WITH YOUR REGULAR TOOTHPASTE  Please do not use if you have an allergy to CHG or antibacterial soaps. If your skin becomes reddened/irritated stop using the CHG.  Do not shave (including legs and underarms) for at least 48 hours prior to first CHG shower. It is OK to shave your face.  Please follow these instructions carefully.   1. Shower the NIGHT BEFORE SURGERY and the MORNING OF SURGERY with CHG Soap.   2. If you chose to wash your hair, wash your hair first as usual with your normal shampoo.  3. After you shampoo, rinse your hair and body thoroughly to remove the shampoo.  4. Use CHG as you would any other liquid soap. You can apply CHG directly to the skin and wash gently with a scrungie or a clean  washcloth.   5. Apply the CHG Soap to your body ONLY FROM THE NECK DOWN.  Do not use on open wounds or open sores. Avoid contact with your eyes, ears, mouth and genitals (private parts). Wash Face and genitals (private parts)  with your normal soap.   6. Wash thoroughly, paying special attention to the area where your surgery will be performed.  7. Thoroughly rinse your body with warm water from the neck down.  8. DO NOT shower/wash with your normal soap after using and rinsing off the CHG Soap.  9. Pat yourself dry with a CLEAN TOWEL.  10. Wear CLEAN PAJAMAS to bed the night before surgery, wear comfortable clothes the morning of surgery  11. Place CLEAN SHEETS on your bed the night of your first shower and DO NOT SLEEP WITH PETS.    Day of Surgery:  Please shower the morning of surgery with the CHG soap Do not apply any deodorants/lotions. Please wear clean clothes to the hospital/surgery center.   Remember to brush your teeth WITH YOUR REGULAR TOOTHPASTE.   Please read over the following fact sheets that you were given.

## 2019-02-05 ENCOUNTER — Encounter (HOSPITAL_COMMUNITY): Payer: Self-pay

## 2019-02-05 ENCOUNTER — Encounter (HOSPITAL_COMMUNITY)
Admission: RE | Admit: 2019-02-05 | Discharge: 2019-02-05 | Disposition: A | Discharge status: Home / Self Care | Payer: 59 | Source: Ambulatory Visit | Attending: Obstetrics and Gynecology | Admitting: Obstetrics and Gynecology

## 2019-02-05 ENCOUNTER — Other Ambulatory Visit (HOSPITAL_COMMUNITY)
Admission: RE | Admit: 2019-02-05 | Discharge: 2019-02-05 | Disposition: A | Discharge status: Home / Self Care | Payer: 59 | Source: Ambulatory Visit | Attending: Obstetrics and Gynecology | Admitting: Obstetrics and Gynecology

## 2019-02-05 DIAGNOSIS — Z01812 Encounter for preprocedural laboratory examination: Secondary | ICD-10-CM | POA: Insufficient documentation

## 2019-02-05 DIAGNOSIS — Z20828 Contact with and (suspected) exposure to other viral communicable diseases: Secondary | ICD-10-CM | POA: Diagnosis not present

## 2019-02-05 DIAGNOSIS — N939 Abnormal uterine and vaginal bleeding, unspecified: Secondary | ICD-10-CM | POA: Insufficient documentation

## 2019-02-05 DIAGNOSIS — D259 Leiomyoma of uterus, unspecified: Secondary | ICD-10-CM | POA: Insufficient documentation

## 2019-02-05 HISTORY — DX: Hypothyroidism, unspecified: E03.9

## 2019-02-05 HISTORY — DX: Pneumonia, unspecified organism: J18.9

## 2019-02-05 LAB — CBC WITH DIFFERENTIAL/PLATELET
Abs Immature Granulocytes: 0.02 10*3/uL (ref 0.00–0.07)
Basophils Absolute: 0 10*3/uL (ref 0.0–0.1)
Basophils Relative: 1 %
Eosinophils Absolute: 0.1 10*3/uL (ref 0.0–0.5)
Eosinophils Relative: 2 %
HCT: 30.1 % — ABNORMAL LOW (ref 36.0–46.0)
Hemoglobin: 7.9 g/dL — ABNORMAL LOW (ref 12.0–15.0)
Immature Granulocytes: 0 %
Lymphocytes Relative: 35 %
Lymphs Abs: 1.9 10*3/uL (ref 0.7–4.0)
MCH: 18.6 pg — ABNORMAL LOW (ref 26.0–34.0)
MCHC: 26.2 g/dL — ABNORMAL LOW (ref 30.0–36.0)
MCV: 71 fL — ABNORMAL LOW (ref 80.0–100.0)
Monocytes Absolute: 0.6 10*3/uL (ref 0.1–1.0)
Monocytes Relative: 10 %
Neutro Abs: 2.8 10*3/uL (ref 1.7–7.7)
Neutrophils Relative %: 52 %
Platelets: 421 10*3/uL — ABNORMAL HIGH (ref 150–400)
RBC: 4.24 MIL/uL (ref 3.87–5.11)
RDW: 17.8 % — ABNORMAL HIGH (ref 11.5–15.5)
WBC: 5.4 10*3/uL (ref 4.0–10.5)
nRBC: 0 % (ref 0.0–0.2)

## 2019-02-05 LAB — BASIC METABOLIC PANEL
Anion gap: 11 (ref 5–15)
BUN: 9 mg/dL (ref 6–20)
CO2: 20 mmol/L — ABNORMAL LOW (ref 22–32)
Calcium: 8.7 mg/dL — ABNORMAL LOW (ref 8.9–10.3)
Chloride: 106 mmol/L (ref 98–111)
Creatinine, Ser: 0.52 mg/dL (ref 0.44–1.00)
GFR calc Af Amer: >60 mL/min (ref >60)
GFR calc non Af Amer: >60 mL/min (ref >60)
Glucose, Bld: 138 mg/dL — ABNORMAL HIGH (ref 70–99)
Potassium: 3.9 mmol/L (ref 3.5–5.1)
Sodium: 137 mmol/L (ref 135–145)

## 2019-02-05 LAB — GLUCOSE, CAPILLARY: Glucose-Capillary: 162 mg/dL — ABNORMAL HIGH (ref 70–99)

## 2019-02-05 LAB — ABO/RH: ABO/RH(D): A POS

## 2019-02-05 NOTE — Progress Notes (Addendum)
PCP - [name] Cardiologist - denies  Chest x-ray - n/a EKG - 09-19-18  DM - Type 2 Fasting Blood Sugar - 130-140   ERAS Protcol - yes, no drink ordered   COVID TEST- Friday, Dec. 18th   Anesthesia review: yes, EKG & abnormal labs (including Hgb 7.9)  Dr. Rosana Hoes notified via inbox message.  Chart sent to anesthesia for review.  Patient denies shortness of breath, fever, cough and chest pain at PAT appointment   All instructions explained to the patient, with a verbal understanding of the material. Patient agrees to go over the instructions while at home for a better understanding. Patient also instructed to self quarantine after being tested for COVID-19. The opportunity to ask questions was provided.

## 2019-02-06 ENCOUNTER — Other Ambulatory Visit: Payer: Self-pay | Admitting: Family Medicine

## 2019-02-06 LAB — NOVEL CORONAVIRUS, NAA (HOSP ORDER, SEND-OUT TO REF LAB; TAT 18-24 HRS): SARS-CoV-2, NAA: NOT DETECTED

## 2019-02-08 ENCOUNTER — Telehealth: Payer: Self-pay | Admitting: Lactation Services

## 2019-02-08 ENCOUNTER — Telehealth: Payer: Self-pay | Admitting: *Deleted

## 2019-02-08 ENCOUNTER — Encounter: Payer: Self-pay | Admitting: *Deleted

## 2019-02-08 DIAGNOSIS — Z029 Encounter for administrative examinations, unspecified: Secondary | ICD-10-CM

## 2019-02-08 NOTE — Telephone Encounter (Signed)
Pt called and left message on nurse voicemail. She would like for [employer] to call her back. Call routed.

## 2019-02-08 NOTE — Progress Notes (Signed)
Per Dr. Rosana Hoes, patient will need to receive 2 units of blood prior to surgery due to low hemoglobin.  Dr. Rosana Hoes asked that 1 unit of blood be infused and the second unit started prior to surgery.  Patient will arrive to short stay at 0730.

## 2019-02-08 NOTE — Anesthesia Preprocedure Evaluation (Addendum)
Anesthesia Evaluation  Patient identified by MRN, date of birth, ID band Patient awake    Reviewed: Allergy & Precautions, NPO status , Patient's Chart, lab work & pertinent test results  Airway Mallampati: II  TM Distance: >3 FB     Dental  (+) Dental Advisory Given   Pulmonary pneumonia,    breath sounds clear to auscultation       Cardiovascular hypertension,  Rhythm:Regular Rate:Normal     Neuro/Psych negative neurological ROS     GI/Hepatic negative GI ROS, Neg liver ROS,   Endo/Other  diabetesHypothyroidism   Renal/GU negative Renal ROS     Musculoskeletal   Abdominal   Peds  Hematology   Anesthesia Other Findings   Reproductive/Obstetrics                            Anesthesia Physical Anesthesia Plan  ASA: III  Anesthesia Plan: General   Post-op Pain Management:    Induction: Intravenous  PONV Risk Score and Plan: 3 and Dexamethasone, Ondansetron and Treatment may vary due to age or medical condition  Airway Management Planned: Oral ETT  Additional Equipment:   Intra-op Plan:   Post-operative Plan: Extubation in OR  Informed Consent: I have reviewed the patients History and Physical, chart, labs and discussed the procedure including the risks, benefits and alternatives for the proposed anesthesia with the patient or authorized representative who has indicated his/her understanding and acceptance.     Dental advisory given  Plan Discussed with: CRNA  Anesthesia Plan Comments:        Anesthesia Quick Evaluation

## 2019-02-08 NOTE — Telephone Encounter (Signed)
Spoke with patient via phone to make her aware of the situation with her insurance denying the preauthorization for her surgery.  I explained to patient that I sent in an appeal this morning with copies of her records.  I explained according to [employer] it would take up to 72 hours for review and determination.  I explained we are concerned with proceeding in case [facility] doesn't approve and she gets a big bill for the surgery.  The patient states understanding.  States she wishes to proceed and will pay out of pocket if her insurance doesn't pay.  States she used to work for [employer] and is aware of the various processes and that she will file an appeal also if needed.  Patient is scheduled for surgery tomorrow at 11:15 am.

## 2019-02-09 ENCOUNTER — Encounter (HOSPITAL_COMMUNITY): Payer: Self-pay | Admitting: Obstetrics and Gynecology

## 2019-02-09 ENCOUNTER — Inpatient Hospital Stay (HOSPITAL_COMMUNITY): Payer: 59 | Admitting: Anesthesiology

## 2019-02-09 ENCOUNTER — Inpatient Hospital Stay (HOSPITAL_COMMUNITY): Payer: 59 | Admitting: Vascular Surgery

## 2019-02-09 ENCOUNTER — Inpatient Hospital Stay (HOSPITAL_COMMUNITY)
Admission: RE | Admit: 2019-02-09 | Discharge: 2019-02-10 | DRG: 743 | Disposition: A | Discharge status: Home / Self Care | Payer: 59 | Attending: Obstetrics and Gynecology | Admitting: Obstetrics and Gynecology

## 2019-02-09 ENCOUNTER — Encounter (HOSPITAL_COMMUNITY)
Admission: RE | Disposition: A | Discharge status: Home / Self Care | Payer: Self-pay | Source: Home / Self Care | Attending: Obstetrics and Gynecology

## 2019-02-09 ENCOUNTER — Other Ambulatory Visit: Payer: Self-pay

## 2019-02-09 DIAGNOSIS — I10 Essential (primary) hypertension: Secondary | ICD-10-CM | POA: Diagnosis present

## 2019-02-09 DIAGNOSIS — E039 Hypothyroidism, unspecified: Secondary | ICD-10-CM | POA: Diagnosis present

## 2019-02-09 DIAGNOSIS — Z79899 Other long term (current) drug therapy: Secondary | ICD-10-CM | POA: Diagnosis not present

## 2019-02-09 DIAGNOSIS — E1169 Type 2 diabetes mellitus with other specified complication: Secondary | ICD-10-CM | POA: Diagnosis present

## 2019-02-09 DIAGNOSIS — N939 Abnormal uterine and vaginal bleeding, unspecified: Secondary | ICD-10-CM | POA: Diagnosis not present

## 2019-02-09 DIAGNOSIS — D259 Leiomyoma of uterus, unspecified: Principal | ICD-10-CM | POA: Diagnosis present

## 2019-02-09 DIAGNOSIS — Z79890 Hormone replacement therapy (postmenopausal): Secondary | ICD-10-CM | POA: Diagnosis not present

## 2019-02-09 DIAGNOSIS — Z7984 Long term (current) use of oral hypoglycemic drugs: Secondary | ICD-10-CM

## 2019-02-09 DIAGNOSIS — E119 Type 2 diabetes mellitus without complications: Secondary | ICD-10-CM

## 2019-02-09 DIAGNOSIS — N938 Other specified abnormal uterine and vaginal bleeding: Secondary | ICD-10-CM | POA: Diagnosis present

## 2019-02-09 DIAGNOSIS — R102 Pelvic and perineal pain: Secondary | ICD-10-CM

## 2019-02-09 DIAGNOSIS — Z9851 Tubal ligation status: Secondary | ICD-10-CM

## 2019-02-09 DIAGNOSIS — E785 Hyperlipidemia, unspecified: Secondary | ICD-10-CM | POA: Diagnosis present

## 2019-02-09 DIAGNOSIS — E669 Obesity, unspecified: Secondary | ICD-10-CM | POA: Diagnosis present

## 2019-02-09 HISTORY — DX: Abnormal uterine and vaginal bleeding, unspecified: N93.9

## 2019-02-09 HISTORY — DX: Pelvic and perineal pain: R10.2

## 2019-02-09 HISTORY — DX: Benign neoplasm of connective and other soft tissue, unspecified: D21.9

## 2019-02-09 HISTORY — PX: HYSTERECTOMY ABDOMINAL WITH SALPINGECTOMY: SHX6725

## 2019-02-09 LAB — GLUCOSE, CAPILLARY
Glucose-Capillary: 99 mg/dL (ref 70–99)
Glucose-Capillary: 118 mg/dL — ABNORMAL HIGH (ref 70–99)
Glucose-Capillary: 138 mg/dL — ABNORMAL HIGH (ref 70–99)

## 2019-02-09 LAB — CREATININE, SERUM
Creatinine, Ser: 0.81 mg/dL (ref 0.44–1.00)
GFR calc Af Amer: >60 mL/min (ref >60)
GFR calc non Af Amer: >60 mL/min (ref >60)

## 2019-02-09 LAB — PREPARE RBC (CROSSMATCH)

## 2019-02-09 LAB — POCT PREGNANCY, URINE: Preg Test, Ur: NEGATIVE

## 2019-02-09 SURGERY — HYSTERECTOMY, TOTAL, ABDOMINAL, WITH SALPINGECTOMY
Anesthesia: Choice | Laterality: Bilateral

## 2019-02-09 MED ORDER — PROPOFOL 10 MG/ML IV BOLUS
INTRAVENOUS | Status: AC
  Filled 2019-02-09: qty 20

## 2019-02-09 MED ORDER — CEFAZOLIN SODIUM-DEXTROSE 2-4 GM/100ML-% IV SOLN
INTRAVENOUS | Status: AC
  Filled 2019-02-09: qty 100

## 2019-02-09 MED ORDER — ROCURONIUM BROMIDE 10 MG/ML (PF) SYRINGE
PREFILLED_SYRINGE | INTRAVENOUS | Status: DC | PRN
  Administered 2019-02-09: 60 mg via INTRAVENOUS

## 2019-02-09 MED ORDER — LOSARTAN POTASSIUM 50 MG PO TABS
50.0000 mg | ORAL_TABLET | Freq: Every day | ORAL | Status: DC
  Administered 2019-02-10: 50 mg via ORAL
  Filled 2019-02-09: qty 1

## 2019-02-09 MED ORDER — KETOROLAC TROMETHAMINE 30 MG/ML IJ SOLN
INTRAMUSCULAR | Status: DC | PRN
  Administered 2019-02-09: 30 mg via INTRAVENOUS

## 2019-02-09 MED ORDER — 0.9 % SODIUM CHLORIDE (POUR BTL) OPTIME
TOPICAL | Status: DC | PRN
  Administered 2019-02-09 (×4): 1000 mL

## 2019-02-09 MED ORDER — INSULIN ASPART 100 UNIT/ML ~~LOC~~ SOLN
0.0000 [IU] | Freq: Three times a day (TID) | SUBCUTANEOUS | Status: DC
  Administered 2019-02-10: 12:00:00 2 [IU] via SUBCUTANEOUS

## 2019-02-09 MED ORDER — ROCURONIUM BROMIDE 10 MG/ML (PF) SYRINGE
PREFILLED_SYRINGE | INTRAVENOUS | Status: AC
  Filled 2019-02-09: qty 10

## 2019-02-09 MED ORDER — PHENYLEPHRINE 40 MCG/ML (10ML) SYRINGE FOR IV PUSH (FOR BLOOD PRESSURE SUPPORT)
PREFILLED_SYRINGE | INTRAVENOUS | Status: AC
  Filled 2019-02-09: qty 10

## 2019-02-09 MED ORDER — ATORVASTATIN CALCIUM 40 MG PO TABS
40.0000 mg | ORAL_TABLET | Freq: Every day | ORAL | Status: DC
  Administered 2019-02-10: 10:00:00 40 mg via ORAL
  Filled 2019-02-09: qty 1

## 2019-02-09 MED ORDER — PANTOPRAZOLE SODIUM 40 MG PO TBEC
40.0000 mg | DELAYED_RELEASE_TABLET | Freq: Every day | ORAL | Status: DC
  Administered 2019-02-10: 10:00:00 40 mg via ORAL
  Filled 2019-02-09: qty 1

## 2019-02-09 MED ORDER — CANAGLIFLOZIN 100 MG PO TABS
100.0000 mg | ORAL_TABLET | Freq: Every day | ORAL | Status: DC
  Administered 2019-02-10: 08:00:00 100 mg via ORAL
  Filled 2019-02-09: qty 1

## 2019-02-09 MED ORDER — LACTATED RINGERS IV SOLN: INTRAVENOUS | Status: DC

## 2019-02-09 MED ORDER — DEXAMETHASONE SODIUM PHOSPHATE 10 MG/ML IJ SOLN
INTRAMUSCULAR | Status: AC
  Filled 2019-02-09: qty 1

## 2019-02-09 MED ORDER — MIDAZOLAM HCL 2 MG/2ML IJ SOLN
INTRAMUSCULAR | Status: AC
  Filled 2019-02-09: qty 2

## 2019-02-09 MED ORDER — SUGAMMADEX SODIUM 200 MG/2ML IV SOLN
INTRAVENOUS | Status: DC | PRN
  Administered 2019-02-09: 200 mg via INTRAVENOUS

## 2019-02-09 MED ORDER — LEVOTHYROXINE SODIUM 25 MCG PO TABS
125.0000 ug | ORAL_TABLET | Freq: Every day | ORAL | Status: DC
  Administered 2019-02-10: 06:00:00 125 ug via ORAL
  Filled 2019-02-09: qty 1

## 2019-02-09 MED ORDER — FENTANYL CITRATE (PF) 250 MCG/5ML IJ SOLN
INTRAMUSCULAR | Status: AC
  Filled 2019-02-09: qty 5

## 2019-02-09 MED ORDER — FENTANYL CITRATE (PF) 100 MCG/2ML IJ SOLN
INTRAMUSCULAR | Status: DC | PRN
  Administered 2019-02-09: 100 ug via INTRAVENOUS
  Administered 2019-02-09: 150 ug via INTRAVENOUS

## 2019-02-09 MED ORDER — SCOPOLAMINE 1 MG/3DAYS TD PT72
MEDICATED_PATCH | TRANSDERMAL | Status: DC | PRN
  Administered 2019-02-09: 1 mg via TRANSDERMAL

## 2019-02-09 MED ORDER — LIDOCAINE 2% (20 MG/ML) 5 ML SYRINGE
INTRAMUSCULAR | Status: DC | PRN
  Administered 2019-02-09: 60 mg via INTRAVENOUS

## 2019-02-09 MED ORDER — CEFAZOLIN SODIUM-DEXTROSE 2-4 GM/100ML-% IV SOLN
2.0000 g | INTRAVENOUS | Status: AC
  Administered 2019-02-09: 2 g via INTRAVENOUS

## 2019-02-09 MED ORDER — SIMETHICONE 80 MG PO CHEW: 80.0000 mg | CHEWABLE_TABLET | Freq: Four times a day (QID) | ORAL | Status: DC | PRN

## 2019-02-09 MED ORDER — SODIUM CHLORIDE 0.9 % IV SOLN: INTRAVENOUS | Status: DC | PRN

## 2019-02-09 MED ORDER — KETOROLAC TROMETHAMINE 30 MG/ML IJ SOLN
INTRAMUSCULAR | Status: AC
  Filled 2019-02-09: qty 1

## 2019-02-09 MED ORDER — HYDROMORPHONE HCL 2 MG PO TABS: 1.0000 mg | ORAL_TABLET | ORAL | Status: DC | PRN

## 2019-02-09 MED ORDER — FENTANYL CITRATE (PF) 100 MCG/2ML IJ SOLN: 25.0000 ug | INTRAMUSCULAR | Status: DC | PRN

## 2019-02-09 MED ORDER — DOCUSATE SODIUM 100 MG PO CAPS
100.0000 mg | ORAL_CAPSULE | Freq: Two times a day (BID) | ORAL | Status: DC
  Administered 2019-02-09 – 2019-02-10 (×2): 100 mg via ORAL
  Filled 2019-02-09 (×2): qty 1

## 2019-02-09 MED ORDER — ACETAMINOPHEN 10 MG/ML IV SOLN
INTRAVENOUS | Status: AC
  Filled 2019-02-09: qty 100

## 2019-02-09 MED ORDER — LIDOCAINE IN D5W 4-5 MG/ML-% IV SOLN
INTRAVENOUS | Status: DC | PRN
  Administered 2019-02-09: 25 ug/kg/min via INTRAVENOUS

## 2019-02-09 MED ORDER — ACETAMINOPHEN 325 MG PO TABS: 650.0000 mg | ORAL_TABLET | ORAL | Status: DC | PRN

## 2019-02-09 MED ORDER — IBUPROFEN 800 MG PO TABS
800.0000 mg | ORAL_TABLET | Freq: Three times a day (TID) | ORAL | Status: DC
  Administered 2019-02-09 – 2019-02-10 (×3): 800 mg via ORAL
  Filled 2019-02-09 (×3): qty 1

## 2019-02-09 MED ORDER — ONDANSETRON HCL 4 MG/2ML IJ SOLN
INTRAMUSCULAR | Status: AC
  Filled 2019-02-09: qty 2

## 2019-02-09 MED ORDER — KETAMINE HCL 10 MG/ML IJ SOLN
INTRAMUSCULAR | Status: DC | PRN
  Administered 2019-02-09: 25 mg via INTRAVENOUS

## 2019-02-09 MED ORDER — SODIUM CHLORIDE 0.9% IV SOLUTION: Freq: Once | INTRAVENOUS | Status: AC

## 2019-02-09 MED ORDER — ACETAMINOPHEN 10 MG/ML IV SOLN
INTRAVENOUS | Status: DC | PRN
  Administered 2019-02-09: 1000 mg via INTRAVENOUS

## 2019-02-09 MED ORDER — SUCCINYLCHOLINE CHLORIDE 200 MG/10ML IV SOSY
PREFILLED_SYRINGE | INTRAVENOUS | Status: AC
  Filled 2019-02-09: qty 10

## 2019-02-09 MED ORDER — ONDANSETRON HCL 4 MG/2ML IJ SOLN
INTRAMUSCULAR | Status: DC | PRN
  Administered 2019-02-09: 4 mg via INTRAVENOUS

## 2019-02-09 MED ORDER — METFORMIN HCL 500 MG PO TABS
1000.0000 mg | ORAL_TABLET | Freq: Two times a day (BID) | ORAL | Status: DC
  Administered 2019-02-09 – 2019-02-10 (×2): 1000 mg via ORAL
  Filled 2019-02-09 (×2): qty 2

## 2019-02-09 MED ORDER — PROPOFOL 10 MG/ML IV BOLUS
INTRAVENOUS | Status: DC | PRN
  Administered 2019-02-09 (×2): 200 mg via INTRAVENOUS

## 2019-02-09 MED ORDER — LIDOCAINE 2% (20 MG/ML) 5 ML SYRINGE
INTRAMUSCULAR | Status: AC
  Filled 2019-02-09: qty 5

## 2019-02-09 MED ORDER — HEMOSTATIC AGENTS (NO CHARGE) OPTIME
TOPICAL | Status: DC | PRN
  Administered 2019-02-09: 1 via TOPICAL

## 2019-02-09 MED ORDER — POLYETHYLENE GLYCOL 3350 17 G PO PACK: 17.0000 g | PACK | Freq: Every day | ORAL | Status: DC | PRN

## 2019-02-09 MED ORDER — MIDAZOLAM HCL 5 MG/5ML IJ SOLN
INTRAMUSCULAR | Status: DC | PRN
  Administered 2019-02-09: 2 mg via INTRAVENOUS

## 2019-02-09 MED ORDER — ONDANSETRON HCL 4 MG PO TABS: 4.0000 mg | ORAL_TABLET | Freq: Four times a day (QID) | ORAL | Status: DC | PRN

## 2019-02-09 MED ORDER — DEXAMETHASONE SODIUM PHOSPHATE 10 MG/ML IJ SOLN
INTRAMUSCULAR | Status: DC | PRN
  Administered 2019-02-09: 4 mg via INTRAVENOUS

## 2019-02-09 MED ORDER — OXYCODONE HCL 5 MG PO TABS
5.0000 mg | ORAL_TABLET | ORAL | Status: DC | PRN
  Administered 2019-02-09 – 2019-02-10 (×2): 10 mg via ORAL
  Filled 2019-02-09 (×2): qty 2

## 2019-02-09 MED ORDER — ONDANSETRON HCL 4 MG/2ML IJ SOLN: 4.0000 mg | Freq: Four times a day (QID) | INTRAMUSCULAR | Status: DC | PRN

## 2019-02-09 MED ORDER — ENOXAPARIN SODIUM 40 MG/0.4ML ~~LOC~~ SOLN
40.0000 mg | SUBCUTANEOUS | Status: DC
  Administered 2019-02-10: 08:00:00 40 mg via SUBCUTANEOUS
  Filled 2019-02-09: qty 0.4

## 2019-02-09 MED ORDER — ONDANSETRON HCL 4 MG/2ML IJ SOLN: 4.0000 mg | Freq: Once | INTRAMUSCULAR | Status: DC | PRN

## 2019-02-09 SURGICAL SUPPLY — 28 items
CANISTER SUCT 3000ML PPV (MISCELLANEOUS) ×2 IMPLANT
DRAPE WARM FLUID 44X44 (DRAPES) ×1 IMPLANT
DRSG OPSITE POSTOP 4X10 (GAUZE/BANDAGES/DRESSINGS) ×2 IMPLANT
DURAPREP 26ML APPLICATOR (WOUND CARE) ×2 IMPLANT
GAUZE 4X4 16PLY RFD (DISPOSABLE) ×2 IMPLANT
GLOVE BIOGEL PI IND STRL 6.5 (GLOVE) ×1 IMPLANT
GLOVE BIOGEL PI IND STRL 7.0 (GLOVE) ×2 IMPLANT
GLOVE BIOGEL PI INDICATOR 6.5 (GLOVE) ×1
GLOVE BIOGEL PI INDICATOR 7.0 (GLOVE) ×2
GLOVE ORTHOPEDIC STR SZ6.5 (GLOVE) ×2 IMPLANT
GOWN STRL REUS W/ TWL LRG LVL3 (GOWN DISPOSABLE) ×3 IMPLANT
GOWN STRL REUS W/TWL LRG LVL3 (GOWN DISPOSABLE) ×3
HEMOSTAT ARISTA ABSORB 3G PWDR (HEMOSTASIS) ×1 IMPLANT
KIT TURNOVER KIT B (KITS) ×2 IMPLANT
NS IRRIG 1000ML POUR BTL (IV SOLUTION) ×2 IMPLANT
PACK ABDOMINAL GYN (CUSTOM PROCEDURE TRAY) ×2 IMPLANT
PAD ARMBOARD 7.5X6 YLW CONV (MISCELLANEOUS) ×4 IMPLANT
PAD OB MATERNITY 4.3X12.25 (PERSONAL CARE ITEMS) ×2 IMPLANT
SPECIMEN JAR MEDIUM (MISCELLANEOUS) ×2 IMPLANT
SPONGE LAP 18X18 RF (DISPOSABLE) ×3 IMPLANT
SUT CHROMIC GUT 2 0 PS 2 27 (SUTURE) ×1 IMPLANT
SUT MON AB 4-0 PS1 27 (SUTURE) ×2 IMPLANT
SUT VIC AB 0 CT1 18XCR BRD8 (SUTURE) ×2 IMPLANT
SUT VIC AB 0 CT1 36 (SUTURE) ×2 IMPLANT
SUT VIC AB 0 CT1 8-18 (SUTURE) ×3
SUT VICRYL 0 TIES 12 18 (SUTURE) ×2 IMPLANT
TOWEL GREEN STERILE FF (TOWEL DISPOSABLE) ×4 IMPLANT
TRAY FOLEY W/BAG SLVR 14FR (SET/KITS/TRAYS/PACK) ×2 IMPLANT

## 2019-02-09 NOTE — Transfer of Care (Signed)
Immediate Anesthesia Transfer of Care Note  Patient: Valerie Bauer  Procedure(s) Performed: HYSTERECTOMY ABDOMINAL WITH SALPINGECTOMY (Bilateral )  Patient Location: PACU  Anesthesia Type:General  Level of Consciousness: responds to stimulation  Airway & Oxygen Therapy: Patient Spontanous Breathing and Patient connected to nasal cannula oxygen, OPA  Post-op Assessment: Report given to RN and Post -op Vital signs reviewed and stable  Post vital signs: Reviewed and stable  Last Vitals:  Vitals Value Taken Time  BP 134/77 02/09/19 1343  Temp 36.7 C 02/09/19 1343  Pulse 73 02/09/19 1344  Resp 17 02/09/19 1344  SpO2 100 % 02/09/19 1344  Vitals shown include unvalidated device data.  Last Pain:  Vitals:   02/09/19 0847  TempSrc: Oral  PainSc:          Complications: No apparent anesthesia complications

## 2019-02-09 NOTE — Anesthesia Postprocedure Evaluation (Signed)
Anesthesia Post Note  Patient: Valerie Bauer  Procedure(s) Performed: HYSTERECTOMY ABDOMINAL WITH SALPINGECTOMY (Bilateral )     Patient location during evaluation: PACU Anesthesia Type: General Level of consciousness: awake and alert Pain management: pain level controlled Vital Signs Assessment: post-procedure vital signs reviewed and stable Respiratory status: spontaneous breathing, nonlabored ventilation, respiratory function stable and patient connected to nasal cannula oxygen Cardiovascular status: blood pressure returned to baseline and stable Postop Assessment: no apparent nausea or vomiting Anesthetic complications: no    Last Vitals:  Vitals:   02/09/19 1401 02/09/19 1434  BP: 134/73 139/78  Pulse: 70 69  Resp: 18 16  Temp:  (!) 36.4 C  SpO2: 96% 99%    Last Pain:  Vitals:   02/09/19 1511  TempSrc:   PainSc: 0-No pain                 Tiajuana Amass

## 2019-02-09 NOTE — Progress Notes (Signed)
Pt up to restroom on arrival. She is eating dinner. States she is having pain, moderately controlled thus far. Reviewed surgery, course. Overnight expectations, plan for removal of foley if urine output adequate. Encouraged patient to request pain meds prn.  Answered all questions.   Feliz Beam, M.D. Attending Center for [employer] farm manager)

## 2019-02-09 NOTE — Op Note (Addendum)
Valerie Bauer PROCEDURE DATE: 02/09/2019  PREOPERATIVE DIAGNOSES: abnormal uterine bleeding, fibroid uterus, pelvic pain  POSTOPERATIVE DIAGNOSES: same  PROCEDURE: Total Abdominal Hysterectomy, Bilateral Salpingectomy  SURGEON:  Feliz Beam, MD  ASSISTANT:  Aletha Halim, MD  An experienced assistant was required given the standard of surgical care given the complexity of the case.  This assistant was needed for exposure, dissection, suctioning, retraction, instrument exchange, and for overall help during the procedure.  ANESTHESIOLOGY TEAM: Anesthesiologist: Suzette Battiest, MD CRNA: Verdie Drown, CRNA; Muqtasid, Wyatt Haste, CRNA  INDICATIONS: Valerie Bauer is a 48 y.o. [phone] here for abdominal hysterectomy secondary to the indications listed under preoperative diagnoses; please see preoperative note for further details.  The risks of abdominal hysterectomy were discussed with the patient including but were not limited to: bleeding which may require transfusion or reoperation; infection which may require antibiotics; injury to bowel, bladder, ureters or other surrounding organs; need for additional procedures, incisional problems, thromboembolic phenomenon and other postoperative/anesthesia complications. She verbalizes understanding that removal of her uterus means she will not be able to get pregnant/carry a pregnancy. She consents to blood transfusion in the event of an emergency. The patient verbalized understanding of the plan, giving informed written consent for the procedure.    FINDINGS:  Enlarged fibroid uterus, approx to umbilicus, normal appearing ovaries bilaterally, normal appearing tubes with evidence of prior tubal ligation  UPT: negative ANESTHESIA: General INTRAVENOUS FLUIDS: 830 ml   ESTIMATED BLOOD LOSS: 25 ml URINE OUTPUT:  350 ml SPECIMENS: Specimen sent to pathology COMPLICATIONS: None immediate  PROCEDURE IN DETAIL:  The patient  preoperatively received intravenous antibiotics and had sequential compression devices applied to her lower extremities.  She was then taken to the operating room where she was placed under general anesthesia without difficulty. She was then placed in a dorsal supine position, a foley catheter was placed in the bladder under sterile technique and attached to gravity drainage. She was then prepped and draped in a sterile manner.  After a timeout was performed, a Pfannenstiel skin incision was made with scalpel and carried through to the underlying layer of fascia. The fascia was incised in the midline, and this incision was extended bilaterally using the Mayo scissors.  Kocher clamps were applied to the superior aspect of the fascial incision and the underlying rectus muscles were dissected off bluntly. The rectus muscles were separated in the midline bluntly and the peritoneum was entered bluntly. The peritoneal incision was carefully extended laterally and caudad with good visualization of the bladder. The uterus appeared enlarged and was gently exteriorized for improved visualization.  The bowel was packed away with moist laparotomy sponges. The round ligaments on each side were clamped, suture ligated, and transected allowing entry into the broad ligament. The anterior and posterior leaves of the broad ligament were separated and the ureters were inspected to be safely away from the area of dissection bilaterally. A bladder flap was then created across the anterior leaf of the broad ligament and the bladder was then bluntly dissected off the lower uterine segment and cervix with good hemostasis.   A hole was created in the clear portion of the posterior broad ligament. Adnexae were clamped on the patient's right side. This pedicle was then clamped, cut, and doubly suture ligated with good hemostasis. This procedure was repeated in an identical fashion on the left site allowing for both adnexa to remain in  place. The uterine arteries were then skeletonized bilaterally and then clamped, cut,  and ligated. The uterus was then amputated across the lower uterine segment leaving the cervix intact for improved visualization. The process of separating the cervix from the uterosacral ligaments via clamp, cut and ligation was continued down until the cervicovaginal junction was reached.   Acutely curved clamps were placed across the vagina just under the cervix, and the remaining cervix was amputated. The vaginal cuff was closed with a series of interrupted 0 Vicryl figure-of-eight sutures via a modified Richardson technique to incorporate the angles with care given to incorporate the anterior pubocervical fascia and the posterior rectovaginal fascia.  The vaginal cuff angles were noted to have good hemostasis.    The right adnexa was grasped using a Babcock and the right fallopian tube was doubly clamped, cut and ligated from the adnexa using 0 vicryl. Inspection then showed hemostasis. The same procedure was then performed on the left adnexa and fallopian tube. The excised fallopian tubes were added to the specimen which was then sent to pathology.  The pelvis was irrigated and suctioned. Hemostasis was confirmed on all surfaces. Arista placed in pelvis for additional hemostasis. The peritoneum was closed with 2-0 Vicryl. The fascia was then closed using 0 Vicryl in a running fashion.  The subcutaneous layer was irrigated, then reapproximated with 2-0 plain gut interrupted stitches.  The skin was closed with a 4-0 Monocryl subcuticular stitch. The patient tolerated the procedure well. Sponge, lap, instrument and needle counts were correct x 3.  She was taken to the recovery room in stable condition.    Feliz Beam, M.D. Attending [place] for [employer], [place]

## 2019-02-09 NOTE — H&P (Signed)
OB/GYN Pre-Op History and Physical  Valerie Bauer is a 48 y.o. [phone] presenting for hysterectomy. History of enlarged fibroid uterus, abnormal uterine bleeding, pelvic pain. Bleeding improved with medical management however she continues to have significant pelvic pain and pressure with her fibroids during her menses. Desires definitive management.      Past Medical History:  Diagnosis Date  . Abnormal uterine bleeding (AUB)   . Diabetes mellitus without complication ([place]) 9024  . Fibroids    Uterine  . HSV infection 2011   dx 2011  . Hypertension 2017  . Hypothyroidism   . Pelvic pain   . Pneumonia     Past Surgical History:  Procedure Laterality Date  . BREAST BIOPSY Left 11/20/2009  . CRYOABLATION  2014   cervical cryoablation ~ 2014  . IR RADIOLOGIST EVAL & MGMT  12/23/2018  . TUBAL LIGATION  1999    OB History  Gravida Para Term Preterm AB Living  '3 3 3     3  '$ SAB TAB Ectopic Multiple Live Births          3    # Outcome Date GA Lbr Len/2nd Weight Sex Delivery Anes PTL Lv  3 Term      Vag-Spont   LIV  2 Term      Vag-Spont   LIV  1 Term      Vag-Spont   LIV    Social History   Socioeconomic History  . Marital status: Single    Spouse name: Not on file  . Number of children: Not on file  . Years of education: Not on file  . Highest education level: Not on file  Occupational History  . Not on file  Tobacco Use  . Smoking status: Never Smoker  . Smokeless tobacco: Never Used  Substance and Sexual Activity  . Alcohol use: No  . Drug use: No  . Sexual activity: Not Currently  Other Topics Concern  . Not on file  Social History Theme park manager. Three children: 2 daughters, 1 son, 6 grandchildren. She was married once for 4 years. Her children were from previous relationships. She works for [employer] She lives alone but has family that is close by.   Social Determinants of Health   Financial Resource Strain:   . Difficulty of Paying  Living Expenses: Not on file  Food Insecurity:   . Worried About Charity fundraiser in the Last Year: Not on file  . Ran Out of Food in the Last Year: Not on file  Transportation Needs:   . Lack of Transportation (Medical): Not on file  . Lack of Transportation (Non-Medical): Not on file  Physical Activity:   . Days of Exercise per Week: Not on file  . Minutes of Exercise per Session: Not on file  Stress:   . Feeling of Stress : Not on file  Social Connections:   . Frequency of Communication with Friends and Family: Not on file  . Frequency of Social Gatherings with Friends and Family: Not on file  . Attends Religious Services: Not on file  . Active Member of Clubs or Organizations: Not on file  . Attends Archivist Meetings: Not on file  . Marital Status: Not on file    Family History  Problem Relation Age of Onset  . Breast cancer Mother 72  . Prostate cancer Father 49    Medications Prior to Admission  Medication Sig Dispense Refill Last Dose  .  atorvastatin (LIPITOR) 40 MG tablet Take 1 tablet (40 mg total) by mouth daily. 90 tablet 3 02/09/2019 at 0600  . blood glucose meter kit and supplies KIT Dispense based on patient and insurance preference. Use up to four times daily as directed. (FOR ICD-9 250.00, 250.01). For QAC - HS accuchecks. 1 each 1 02/09/2019 at Unknown time  . empagliflozin (JARDIANCE) 10 MG TABS tablet Take 10 mg by mouth daily. 90 tablet 3 02/08/2019 at Unknown time  . ferrous sulfate 325 (65 FE) MG EC tablet Take 1 tablet (325 mg total) by mouth 3 (three) times daily with meals. (Patient taking differently: Take 325 mg by mouth daily with breakfast. ) 90 tablet 3 Past Week at Unknown time  . glucose blood (FREESTYLE LITE) test strip For glucose testing every before meals at bedtime. Diagnosis E 11.65  Can substitute to any accepted brand 100 each 0 02/09/2019 at Unknown time  . glucose blood (ONETOUCH VERIO) test strip Use up to twice daily  testing. E11.9 100 each 12 02/09/2019 at Unknown time  . Lancet Devices (ONE TOUCH DELICA LANCING DEV) MISC Use up to twice daily testing. E11.9 100 each 3 02/09/2019 at Unknown time  . levothyroxine (SYNTHROID, LEVOTHROID) 125 MCG tablet Take 1 tablet (125 mcg total) by mouth daily. 90 tablet 3 02/09/2019 at 0600  . losartan (COZAAR) 50 MG tablet Take 1 tablet (50 mg total) by mouth daily. 90 tablet 3 02/08/2019 at Unknown time  . metFORMIN (GLUCOPHAGE) 1000 MG tablet Take 1,000 mg by mouth 2 (two) times daily with a meal.   02/08/2019 at Unknown time  . ONETOUCH DELICA LANCETS 15A MISC Use up to twice daily testing. E11.9 100 each 3 02/09/2019 at Unknown time  . TRULICITY 3.09 MM/7.6KG SOPN INJECT 0.75  SUBCUTANEOUSLY ONCE A WEEK 4 mL 3 02/08/2019 at Unknown time  . acetaminophen (TYLENOL) 500 MG tablet Take 500 mg by mouth every 6 (six) hours as needed for mild pain or fever.   Unknown at Unknown time  . valACYclovir (VALTREX) 500 MG tablet Take 1 tablet (500 mg total) by mouth daily. Can increase to twice a day for 5 days in the event of a recurrence (Patient taking differently: Take 500 mg by mouth daily as needed (outbreaks). ) 30 tablet 12 Unknown at Unknown time   No Known Allergies  Review of Systems: Negative except for what is mentioned in HPI.     Physical Exam: BP (!) 150/83   Pulse 74   Temp 98 F (36.7 C) (Oral)   Resp 12   Ht 5' (1.524 m)   Wt 81.6 kg   LMP 01/30/2019   SpO2 100%   BMI 35.15 kg/m  CONSTITUTIONAL: Well-developed, well-nourished female in no acute distress.  HENT:  Normocephalic, atraumatic, External right and left ear normal. Oropharynx is clear and moist EYES: Conjunctivae and EOM are normal. Pupils are equal, round, and reactive to light. No scleral icterus.  NECK: Normal range of motion, supple, no masses SKIN: Skin is warm and dry. No rash noted. Not diaphoretic. No erythema. No pallor. [place]: Alert and oriented to person, place, and time.  Normal reflexes, muscle tone coordination. No cranial nerve deficit noted. PSYCHIATRIC: Normal mood and affect. Normal behavior. Normal judgment and thought content. CARDIOVASCULAR: Normal heart rate noted RESPIRATORY: Effort normal, no problems with respiration noted ABDOMEN: Soft, nontender. Enlarged fibroid uterus. PELVIC: Deferred MUSCULOSKELETAL: Normal range of motion. No edema and no tenderness. 2+ distal pulses.  Pertinent Labs/Studies:  Results for orders placed or performed during the hospital encounter of 02/09/19 (from the past 72 hour(s))  Prepare RBC     Status: None   Collection Time: 02/09/19  7:47 AM  Result Value Ref Range   Order Confirmation      ORDER PROCESSED BY BLOOD BANK Performed at [facility], [place] [address] [place], [place] [address]   Pregnancy, urine POC     Status: None   Collection Time: 02/09/19  8:39 AM  Result Value Ref Range   Preg Test, Ur NEGATIVE NEGATIVE    Comment:        THE SENSITIVITY OF THIS METHODOLOGY IS >24 mIU/mL    Pap 04/30/18: negative, positive hr HPV (neg 16/18/45) Endo Bx 06/01/18: benign Endo Bx 01/08/19: Benign     Assessment and Plan :Valerie Bauer is a 48 y.o. [id number] here for abdominal hysterectomy, bilateral salpingectomy. With history of enlarged fibroid uterus, heavy menses, pelvic pain. Patient with low H/H at pre-op appt and came this am for pre-op blood transfusion, has received 1 unit thus far, has another in process.  Reviewed abdominal hysterectomy given size of uterus. The risks of abdominal hysterectomy were discussed with the patient; including but not limited to: infection which may require antibiotics; bleeding which may require transfusion or re-operation; injury to bowel, bladder, ureters or other surrounding organs; need for additional procedures, incisional problems, thromboembolic phenomenon and other postoperative/anesthesia complications. Reviewed that with a hysterectomy, she will not be  able to have children in the future. Reviewed recommendation to remove fallopian tubes to reduce risk of ovarian cancer, but as she is 51, would not recommend removal of ovaries at this time. Reviewed that ovaries may be removed if they are abnormal appearing, anatomy requires it or if there is damage to blood supply. Reviewed that removal of ovaries would put her into surgical menopause and we will attempt to avoid this if possible. Reviewed expected post surgery course and hospital stay.  Patient verbalized understanding of the above and consents to blood transfusion in the event of a life-threatening hemorrhage. Answered all questions.    Patient is NPO Anesthesia aware Preoperative prophylactic antibiotics ordered SCDs  Admission labs To OR when ready   K. Arvilla Meres, M.D. Attending [place], [facility] for [employer], [place]

## 2019-02-09 NOTE — Progress Notes (Signed)
Patient arrived from pacu, AOx4, VSS, no complaints of pain at this time, oriented to room, call bell, use of bed controls, will continue to monitor.

## 2019-02-10 DIAGNOSIS — D259 Leiomyoma of uterus, unspecified: Secondary | ICD-10-CM

## 2019-02-10 LAB — TYPE AND SCREEN
ABO/RH(D): A POS
Antibody Screen: NEGATIVE
Unit division: 0
Unit division: 0

## 2019-02-10 LAB — CBC
HCT: 33.4 % — ABNORMAL LOW (ref 36.0–46.0)
Hemoglobin: 9.7 g/dL — ABNORMAL LOW (ref 12.0–15.0)
MCH: 20.4 pg — ABNORMAL LOW (ref 26.0–34.0)
MCHC: 29 g/dL — ABNORMAL LOW (ref 30.0–36.0)
MCV: 70.2 fL — ABNORMAL LOW (ref 80.0–100.0)
Platelets: 467 10*3/uL — ABNORMAL HIGH (ref 150–400)
RBC: 4.76 MIL/uL (ref 3.87–5.11)
RDW: 20.6 % — ABNORMAL HIGH (ref 11.5–15.5)
WBC: 10.6 10*3/uL — ABNORMAL HIGH (ref 4.0–10.5)
nRBC: 0 % (ref 0.0–0.2)

## 2019-02-10 LAB — BPAM RBC
Blood Product Expiration Date: 202101242359
Blood Product Expiration Date: 202101242359
ISSUE DATE / TIME: 202012220856
ISSUE DATE / TIME: 202012220856
Unit Type and Rh: 6200
Unit Type and Rh: 6200

## 2019-02-10 LAB — GLUCOSE, CAPILLARY
Glucose-Capillary: 112 mg/dL — ABNORMAL HIGH (ref 70–99)
Glucose-Capillary: 136 mg/dL — ABNORMAL HIGH (ref 70–99)

## 2019-02-10 MED ORDER — OXYCODONE HCL 5 MG PO TABS: 5.0000 mg | ORAL_TABLET | ORAL | 0 refills | Status: DC | PRN

## 2019-02-10 MED ORDER — DOCUSATE SODIUM 100 MG PO CAPS: 100.0000 mg | ORAL_CAPSULE | Freq: Every day | ORAL | 2 refills | Status: DC

## 2019-02-10 MED ORDER — IBUPROFEN 800 MG PO TABS: 800.0000 mg | ORAL_TABLET | Freq: Three times a day (TID) | ORAL | 1 refills | Status: DC | PRN

## 2019-02-10 NOTE — Discharge Summary (Signed)
Physician Discharge Summary  Patient ID: Valerie Bauer MRN: [id number] DOB/AGE: 1970/06/24 48 y.o.  Admit date: 02/09/2019 Discharge date: 02/10/2019  Admission Diagnoses: AUB, fibroid uterus, pelvic pain  Discharge Diagnoses:  Principal Problem:   Abnormal uterine bleeding (AUB) Active Problems:   Obesity (BMI 30-39.9)   Primary hypertension   Hyperlipidemia associated with type 2 diabetes mellitus ([place])   Hypothyroidism (acquired)   Type 2 diabetes mellitus without complication, without long-term current use of insulin ([place])   Fibroid uterus   Discharged Condition: good  Hospital Course: Please see HPI dated 02/09/2019 for full details. Briefly, this is a 48 y.o. [id number] female admitted for TAH, BS. Her hospital course was uncomplicated. By POD#1 she was eating without nausea/vomiting, voiding spontaneously without issue, passing flatus. Her pain was well controlled on PO pain meds and she was ambulating without difficulty.She was discharged home POD#1 in good condition. She will f/u in office in 2 weeks, has been given instructions.  Medical history significant for hypertension, DM, hyperlipidemia, hypothyroid.   Physical Exam:  BP 128/76 (BP Location: Right Arm)   Pulse 88   Temp 98.6 F (37 C) (Oral)   Resp 16   Ht 5' (1.524 m)   Wt 81.6 kg   LMP 01/30/2019   SpO2 100%   BMI 35.15 kg/m  General: alert, oriented, cooperative Chest: normal respiratory effort Heart: RRR  Abdomen: soft, appropriately tender to palpation, incision covered by dressing with no evidence of active bleeding  DVT Evaluation: no evidence of DVT Extremities: no edema, no calf tenderness   Labs: Lab Results  Component Value Date   WBC 10.6 (H) 02/10/2019   HGB 9.7 (L) 02/10/2019   HCT 33.4 (L) 02/10/2019   MCV 70.2 (L) 02/10/2019   PLT 467 (H) 02/10/2019   CMP Latest Ref Rng & Units 02/09/2019  Glucose 70 - 99 mg/dL -  BUN 6 - 20 mg/dL -  Creatinine 0.44 - 1.00 mg/dL 0.81    Sodium 135 - 145 mmol/L -  Potassium 3.5 - 5.1 mmol/L -  Chloride 98 - 111 mmol/L -  CO2 22 - 32 mmol/L -  Calcium 8.9 - 10.3 mg/dL -  Total Protein 6.5 - 8.1 g/dL -  Total Bilirubin 0.3 - 1.2 mg/dL -  Alkaline Phos 38 - 126 U/L -  AST 15 - 41 U/L -  ALT 0 - 44 U/L -   Disposition: Discharge disposition: 01-Home or Self Care       Discharge Instructions    Call MD for:  difficulty breathing, headache or visual disturbances   Complete by: As directed    Call MD for:  extreme fatigue   Complete by: As directed    Call MD for:  hives   Complete by: As directed    Call MD for:  persistant dizziness or light-headedness   Complete by: As directed    Call MD for:  persistant nausea and vomiting   Complete by: As directed    Call MD for:  redness, tenderness, or signs of infection (pain, swelling, redness, odor or green/yellow discharge around incision site)   Complete by: As directed    Call MD for:  severe uncontrolled pain   Complete by: As directed    Call MD for:  temperature >100.4   Complete by: As directed    Diet - low sodium heart healthy   Complete by: As directed    Discharge wound care:   Complete by: As directed  Wash with warm, soapy water. Pat dry, do not scrub.   Increase activity slowly   Complete by: As directed    Lifting restrictions   Complete by: As directed    No lifting heavier than 20 pounds.   May shower / Bathe   Complete by: As directed    Sexual Activity Restrictions   Complete by: As directed    Nothing in the vagina until cleared by your doctor.     An After Visit Summary was printed and given to the patient. Allergies as of 02/10/2019   No Known Allergies     Medication List    TAKE these medications   acetaminophen 500 MG tablet Commonly known as: TYLENOL Take 500 mg by mouth every 6 (six) hours as needed for mild pain or fever.   atorvastatin 40 MG tablet Commonly known as: LIPITOR Take 1 tablet (40 mg total) by mouth  daily.   blood glucose meter kit and supplies Kit Dispense based on patient and insurance preference. Use up to four times daily as directed. (FOR ICD-9 250.00, 250.01). For QAC - HS accuchecks.   docusate sodium 100 MG capsule Commonly known as: COLACE Take 1 capsule (100 mg total) by mouth daily.   empagliflozin 10 MG Tabs tablet Commonly known as: JARDIANCE Take 10 mg by mouth daily.   ferrous sulfate 325 (65 FE) MG EC tablet Take 1 tablet (325 mg total) by mouth 3 (three) times daily with meals. What changed: when to take this   glucose blood test strip Commonly known as: OneTouch Verio Use up to twice daily testing. E11.9   FREESTYLE LITE test strip Generic drug: glucose blood For glucose testing every before meals at bedtime. Diagnosis E 11.65  Can substitute to any accepted brand   ibuprofen 800 MG tablet Commonly known as: ADVIL Take 1 tablet (800 mg total) by mouth every 8 (eight) hours as needed.   levothyroxine 125 MCG tablet Commonly known as: SYNTHROID Take 1 tablet (125 mcg total) by mouth daily.   losartan 50 MG tablet Commonly known as: COZAAR Take 1 tablet (50 mg total) by mouth daily.   metFORMIN 1000 MG tablet Commonly known as: GLUCOPHAGE Take 1,000 mg by mouth 2 (two) times daily with a meal.   ONE TOUCH DELICA LANCING DEV Misc Use up to twice daily testing. N36.1   OneTouch Delica Lancets 44R Misc Use up to twice daily testing. E11.9   oxyCODONE 5 MG immediate release tablet Commonly known as: Roxicodone Take 1 tablet (5 mg total) by mouth every 4 (four) hours as needed for severe pain.   Trulicity 1.54 MG/8.6PY Sopn Generic drug: Dulaglutide INJECT 0.75  SUBCUTANEOUSLY ONCE A WEEK   valACYclovir 500 MG tablet Commonly known as: VALTREX Take 1 tablet (500 mg total) by mouth daily. Can increase to twice a day for 5 days in the event of a recurrence What changed:   when to take this  reasons to take this  additional instructions              Discharge Care Instructions  (From admission, onward)         Start     Ordered   02/10/19 0000  Discharge wound care:    Comments: Wash with warm, soapy water. Pat dry, do not scrub.   02/10/19 [place] for [facility]. Schedule an appointment as soon as possible for a  visit in 2 week(s).   Specialty: Obstetrics and Gynecology Why: with Dr. Augustina Mood information: [address] [place], [address] [id number] [place] [address] [phone]          Signed: Sloan Leiter 02/10/2019, 3:09 PM

## 2019-02-10 NOTE — Progress Notes (Signed)
Gynecology Progress Note  Admission Date: 02/09/2019 Current Date: 02/10/2019 8:35 AM  Valerie Bauer is a 48 y.o. [id number] HD#2 admitted post op s/p TAH, BS.   History complicated by: Patient Active Problem List   Diagnosis Date Noted  . Abnormal uterine bleeding (AUB) 02/09/2019  . Type 2 diabetes mellitus without complication, without long-term current use of insulin ([place]) 12/25/2018  . Hypothyroidism (acquired) 04/01/2018  . Iron deficiency anemia 07/19/2016  . Hyperlipidemia associated with type 2 diabetes mellitus ([place]) 07/18/2016  . Obesity (BMI 30-39.9) 12/21/2007  . Primary hypertension 12/21/2007    Subjective:  Patient feeling much more awake this am. Ate dinner last night without any issues, denies nausea or vomiting. Slept very well last night. Having some pain but reports it is manageable with pain medication. Walked in [employer] and is doing alright ambulating. Foley recently out, has not voided yet. Has not passed gas/had BM. Overall, feeling well.  Objective:   Vitals:   02/09/19 1434 02/09/19 2120 02/10/19 0228 02/10/19 0628  BP: 139/78 (!) 144/86 (!) 142/86 115/71  Pulse: 69 80 86 90  Resp: 16 15 17 16   Temp: (!) 97.5 F (36.4 C) 98.9 F (37.2 C) 98.7 F (37.1 C) 98.6 F (37 C)  TempSrc: Oral Oral Oral Oral  SpO2: 99% 100% 98% 97%  Weight:      Height:        No intake/output data recorded.  Intake/Output Summary (Last 24 hours) at 02/10/2019 0835 Last data filed at 02/10/2019 [id number] Gross per 24 hour  Intake 2567.26 ml  Output 2375 ml  Net 192.26 ml     Physical exam: BP 115/71 (BP Location: Right Arm)   Pulse 90   Temp 98.6 F (37 C) (Oral)   Resp 16   Ht 5' (1.524 m)   Wt 81.6 kg   LMP 01/30/2019   SpO2 97%   BMI 35.15 kg/m  CONSTITUTIONAL: Well-developed, well-nourished female in no acute distress.  HENT:  Normocephalic, atraumatic, External right and left ear normal. Oropharynx is clear and moist EYES: Conjunctivae and EOM  are normal. Pupils are equal, round, and reactive to light. No scleral icterus.  NECK: Normal range of motion, supple, no masses.  Normal thyroid.  SKIN: Skin is warm and dry. No rash noted. Not diaphoretic. No erythema. No pallor. NEUROLOGIC: Alert and oriented to person, place, and time. Normal reflexes, muscle tone coordination. No cranial nerve deficit noted. PSYCHIATRIC: Normal mood and affect. Normal behavior. Normal judgment and thought content. CARDIOVASCULAR: Normal heart rate noted RESPIRATORY: Effort normal, no problems with respiration noted. ABDOMEN: Soft, no distention noted. incision clean/dry/intact with no evidence of active bleeding, appropriately tender, no rebound or guarding.  PELVIC: deferred MUSCULOSKELETAL: Normal range of motion. No tenderness.  No cyanosis, clubbing, or edema.    UOP: 100 mL/hr Labs  Recent Labs  Lab 02/05/19 0900 02/10/19 0308  WBC 5.4 10.6*  HGB 7.9* 9.7*  HCT 30.1* 33.4*  PLT 421* 467*     Assessment & Plan:   Patient is 48 y.o. [id number] POD#1 s/p TAH, BS. She is doing very well this am. VSS, H/H appropriate. Foley recently out, needs to void. Has not passed gas either. Pain well controlled and patient feeling well. Reviewed plan for discharge home later today as long as she has no issues voiding or passing gas. Will check in with RN later today. She verbalizes understanding and is agreeable with this plan.    Cont home meds Due  to void Pain meds prn   Feliz Beam, M.D. Attending [facility] for [employer] Fish farm manager)

## 2019-02-10 NOTE — Discharge Instructions (Signed)
-   You may shower after your surgery. The bandage will likely start to peel off in 3-4 days, when it does, you may take it off. When you shower, let water run over your incisions and pat dry. Do not take a bath or go swimming until you are cleared by your doctor. - A small amount of bleeding or oozing from the incision is normal, if you have a lot or if you are concerned about it, please call the office.  - Do not do any heavy lifting or major activity for at least 4 weeks after your surgery. You should be getting up and moving around the house with daily activities but do not strain yourself. - Do not put ANYTHING in the vagina until cleared by your doctor. This includes tampons, intercourse, douching, fingers, toys, etc. You need time to heal from the surgery. - Some spotting is normal even up to a few weeks after the surgery. If you have bleeding or foul smelling discharge, please call your doctor.   IF YOU HAVE ANY QUESTIONS OR CONCERNS, PLEASE CALL THE OFFICE.

## 2019-02-10 NOTE — Progress Notes (Signed)
Discharged patient to home, instructions given and explained. Belongings returned accordingly.

## 2019-02-11 LAB — SURGICAL PATHOLOGY

## 2019-02-22 ENCOUNTER — Telehealth: Payer: Self-pay | Admitting: *Deleted

## 2019-02-22 ENCOUNTER — Telehealth (INDEPENDENT_AMBULATORY_CARE_PROVIDER_SITE_OTHER): Payer: 59

## 2019-02-22 DIAGNOSIS — Z5189 Encounter for other specified aftercare: Secondary | ICD-10-CM

## 2019-02-22 NOTE — Telephone Encounter (Signed)
I called Jame at her request to discuss her picture of her incision. I informed her I could not see her incision well enough to be able to tell her if it looked ok. She states she will resend a picture; but she was concerned there was a " little meat" showing. I confirmed we will call her again after we get her new picture. She also asked if she can get a prescription refill on her oxycodone. I informed her no, we do not usually refill narcotics and asked if her pain was severe. She states no - it is getting better she is just about to run out. She confirms she has more of the ibuprofen and is ok with continuing to take that. I explained if she has severe pain - she would need to be seen and evaluated for complication. She voices understanding.  Aloysius Heinle,RN

## 2019-02-22 NOTE — Telephone Encounter (Signed)
Pt requested to have a call back in regards to the pic that she sent about her incision check.  Called pt that her incision looks normal.  The provider can evaluate it at her appt on 03/01/19.  Pt verbalized understanding.

## 2019-02-24 ENCOUNTER — Ambulatory Visit (INDEPENDENT_AMBULATORY_CARE_PROVIDER_SITE_OTHER): Payer: 59 | Admitting: Obstetrics and Gynecology

## 2019-02-24 ENCOUNTER — Other Ambulatory Visit: Payer: Self-pay

## 2019-02-24 ENCOUNTER — Encounter: Payer: Self-pay | Admitting: Obstetrics and Gynecology

## 2019-02-24 VITALS — BP 136/81 | HR 68 | Ht 60.0 in | Wt 178.0 lb

## 2019-02-24 DIAGNOSIS — Z90710 Acquired absence of both cervix and uterus: Secondary | ICD-10-CM

## 2019-02-24 DIAGNOSIS — R300 Dysuria: Secondary | ICD-10-CM

## 2019-02-24 DIAGNOSIS — Z9889 Other specified postprocedural states: Secondary | ICD-10-CM

## 2019-02-24 NOTE — Progress Notes (Signed)
GYNECOLOGY OFFICE FOLLOW UP NOTE  History:  49 y.o. [id number] here today for follow up for TAH, BS done on 02/09/19 for pelvic pain, heavy menses, fibroid uterus. She is doing well. Still having pain from procedure and moving slowly, but reports she is improving overall. Scant vaginal discharge, no bleeding. Appetite back to normal, no issues with bathroom use. Overall, feeling well.    Past Medical History:  Diagnosis Date  . Abnormal uterine bleeding (AUB)   . Diabetes mellitus without complication ([place]) 5056  . Fibroids    Uterine  . HSV infection 2011   dx 2011  . Hypertension 2017  . Hypothyroidism   . Pelvic pain   . Pneumonia    Past Surgical History:  Procedure Laterality Date  . BREAST BIOPSY Left 11/20/2009  . CRYOABLATION  2014   cervical cryoablation ~ 2014  . HYSTERECTOMY ABDOMINAL WITH SALPINGECTOMY Bilateral 02/09/2019   Procedure: HYSTERECTOMY ABDOMINAL WITH SALPINGECTOMY;  Surgeon: Sloan Leiter, MD;  Location: [facility];  Service: Gynecology;  Laterality: Bilateral;  . IR RADIOLOGIST EVAL & MGMT  12/23/2018  . TUBAL LIGATION  1999    Current Outpatient Medications:  .  atorvastatin (LIPITOR) 40 MG tablet, Take 1 tablet (40 mg total) by mouth daily., Disp: 90 tablet, Rfl: 3 .  blood glucose meter kit and supplies KIT, Dispense based on patient and insurance preference. Use up to four times daily as directed. (FOR ICD-9 250.00, 250.01). For QAC - HS accuchecks., Disp: 1 each, Rfl: 1 .  empagliflozin (JARDIANCE) 10 MG TABS tablet, Take 10 mg by mouth daily., Disp: 90 tablet, Rfl: 3 .  ferrous sulfate 325 (65 FE) MG EC tablet, Take 1 tablet (325 mg total) by mouth 3 (three) times daily with meals. (Patient taking differently: Take 325 mg by mouth daily with breakfast. ), Disp: 90 tablet, Rfl: 3 .  glucose blood (FREESTYLE LITE) test strip, For glucose testing every before meals at bedtime. Diagnosis E 11.65  Can substitute to any accepted brand, Disp: 100 each, Rfl: 0 .   glucose blood (ONETOUCH VERIO) test strip, Use up to twice daily testing. E11.9, Disp: 100 each, Rfl: 12 .  ibuprofen (ADVIL) 800 MG tablet, Take 1 tablet (800 mg total) by mouth every 8 (eight) hours as needed., Disp: 30 tablet, Rfl: 1 .  Lancet Devices (ONE TOUCH DELICA LANCING DEV) MISC, Use up to twice daily testing. E11.9, Disp: 100 each, Rfl: 3 .  levothyroxine (SYNTHROID, LEVOTHROID) 125 MCG tablet, Take 1 tablet (125 mcg total) by mouth daily., Disp: 90 tablet, Rfl: 3 .  losartan (COZAAR) 50 MG tablet, Take 1 tablet (50 mg total) by mouth daily., Disp: 90 tablet, Rfl: 3 .  metFORMIN (GLUCOPHAGE) 1000 MG tablet, Take 1,000 mg by mouth 2 (two) times daily with a meal., Disp: , Rfl:  .  ONETOUCH DELICA LANCETS 97X MISC, Use up to twice daily testing. E11.9, Disp: 100 each, Rfl: 3 .  oxyCODONE (ROXICODONE) 5 MG immediate release tablet, Take 1 tablet (5 mg total) by mouth every 4 (four) hours as needed for severe pain., Disp: 30 tablet, Rfl: 0 .  TRULICITY 4.80 XK/5.5VZ SOPN, INJECT 0.75  SUBCUTANEOUSLY ONCE A WEEK, Disp: 4 mL, Rfl: 3 .  valACYclovir (VALTREX) 500 MG tablet, Take 1 tablet (500 mg total) by mouth daily. Can increase to twice a day for 5 days in the event of a recurrence (Patient taking differently: Take 500 mg by mouth daily as needed (outbreaks). ), Disp: 30  tablet, Rfl: 12  The following portions of the patient's history were reviewed and updated as appropriate: allergies, current medications, past family history, past medical history, past social history, past surgical history and problem list.   Review of Systems:  Pertinent items noted in HPI and remainder of comprehensive ROS otherwise negative.   Objective:  Physical Exam BP 136/81   Pulse 68   Ht 5' (1.524 m)   Wt 178 lb (80.7 kg)   LMP 01/30/2019   BMI 34.76 kg/m  CONSTITUTIONAL: Well-developed, well-nourished female in no acute distress.  HENT:  Normocephalic, atraumatic. External right and left ear normal.  Oropharynx is clear and moist EYES: Conjunctivae and EOM are normal. Pupils are equal, round, and reactive to light. No scleral icterus.  NECK: Normal range of motion, supple, no masses SKIN: Skin is warm and dry. No rash noted. Not diaphoretic. No erythema. No pallor. NEUROLOGIC: Alert and oriented to person, place, and time. Normal reflexes, muscle tone coordination. No cranial nerve deficit noted. PSYCHIATRIC: Normal mood and affect. Normal behavior. Normal judgment and thought content. CARDIOVASCULAR: Normal heart rate noted RESPIRATORY: Effort normal, no problems with respiration noted ABDOMEN: Soft, no distention noted. incisions intact with no erythema or oozing PELVIC: Normal appearing external genitalia; normal appearing vaginal mucosa, scant brown discharge in vagina, cuff appears intact, palpates intact with minimal tenderness MUSCULOSKELETAL: Normal range of motion. No edema noted.  Exam done with chaperone present.  Labs and Imaging No results found.  SURGICAL PATHOLOGY  CASE: [id number]  PATIENT: Valerie Bauer  Surgical Pathology Report   Clinical History: fibroids, AUB (cm)      FINAL MICROSCOPIC DIAGNOSIS:   A. UTERUS, CERVIX AND BILATERAL FALLOPIAN TUBES, HYSTERECTOMY:  - Cervix    Squamous metaplasia.    No dysplasia or malignancy.  - Endometrium    Proliferative.    Benign endometrial polyps.    No hyperplasia or malignancy.  - Myometrium    Extensive adenomyosis.    Leiomyomata.    No evidence of malignancy.  - Uterine serosa    Subserosal endometriosis.    No evidence of malignancy.  -Bilateral Fallopian tubes    Unremarkable.    No endometriosis or malignancy.   Assessment & Plan:  1. Dysuria - likely normal secondary to hyst - Urine Culture  2. Post-operative state Doing well, no issues  3. S/P hysterectomy - intact cuff - no issues - reviewed precautions   Routine preventative health maintenance measures  emphasized. Please refer to After Visit Summary for other counseling recommendations.   Return in about 3 weeks (around 03/17/2019).  Total face-to-face time with patient: 15 minutes. Over 50% of encounter was spent on counseling and coordination of care.  Feliz Beam, M.D. Attending [facility] for [employer] [facility])

## 2019-02-25 ENCOUNTER — Telehealth: Payer: Self-pay | Admitting: General Practice

## 2019-02-25 ENCOUNTER — Encounter: Payer: Self-pay | Admitting: *Deleted

## 2019-02-25 LAB — URINE CULTURE

## 2019-02-25 NOTE — Telephone Encounter (Signed)
Received message from front office staff patient was requesting a call back. Called patient stating I am returning her phone call. Patient states she was calling to check on the status of her appeal with insurance for her surgery. She states she has been talking with Claiborne Billings and has started to receive bills in the mail. Told patient I would check with her and have her reach back out to you. Patient verbalized understanding & had no questions.

## 2019-03-01 ENCOUNTER — Ambulatory Visit: Payer: 59 | Admitting: Obstetrics and Gynecology

## 2019-03-18 ENCOUNTER — Ambulatory Visit: Payer: 59 | Admitting: Obstetrics and Gynecology

## 2019-03-18 ENCOUNTER — Telehealth: Payer: Self-pay | Admitting: Obstetrics and Gynecology

## 2019-03-18 NOTE — Telephone Encounter (Signed)
Called Ms. Bonnye Fava to inform her Dr. Rosana Hoes is not in the office, and we would have to reschedule her appointment. She stated she was to return back to work on Monday, and needed a note to return to work. She stated she feels great, and her incision looks and feels great. She is okay not to return to the office because she needs to go back to work. Spoke with Flossie Dibble, and she stated I could write her a return back to work letter, and she would message Dr Rosana Hoes.

## 2019-04-27 ENCOUNTER — Other Ambulatory Visit: Payer: Self-pay | Admitting: *Deleted

## 2019-04-27 DIAGNOSIS — E1169 Type 2 diabetes mellitus with other specified complication: Secondary | ICD-10-CM

## 2019-04-27 DIAGNOSIS — E785 Hyperlipidemia, unspecified: Secondary | ICD-10-CM

## 2019-04-28 MED ORDER — ATORVASTATIN CALCIUM 40 MG PO TABS: 40.0000 mg | ORAL_TABLET | Freq: Every day | ORAL | 3 refills | Status: DC

## 2019-05-03 ENCOUNTER — Other Ambulatory Visit: Payer: Self-pay | Admitting: Family Medicine

## 2019-05-03 ENCOUNTER — Encounter: Payer: Self-pay | Admitting: Family Medicine

## 2019-05-03 ENCOUNTER — Other Ambulatory Visit: Payer: Self-pay

## 2019-05-03 ENCOUNTER — Ambulatory Visit (INDEPENDENT_AMBULATORY_CARE_PROVIDER_SITE_OTHER): Payer: 59 | Admitting: Family Medicine

## 2019-05-03 VITALS — BP 170/94 | HR 93 | Wt 147.0 lb

## 2019-05-03 DIAGNOSIS — I10 Essential (primary) hypertension: Secondary | ICD-10-CM

## 2019-05-03 DIAGNOSIS — E1159 Type 2 diabetes mellitus with other circulatory complications: Secondary | ICD-10-CM

## 2019-05-03 DIAGNOSIS — N939 Abnormal uterine and vaginal bleeding, unspecified: Secondary | ICD-10-CM

## 2019-05-03 DIAGNOSIS — I152 Hypertension secondary to endocrine disorders: Secondary | ICD-10-CM

## 2019-05-03 DIAGNOSIS — Z01419 Encounter for gynecological examination (general) (routine) without abnormal findings: Secondary | ICD-10-CM

## 2019-05-03 MED ORDER — LOSARTAN POTASSIUM 50 MG PO TABS: 50.0000 mg | ORAL_TABLET | Freq: Every day | ORAL | 3 refills | Status: DC

## 2019-05-03 NOTE — Assessment & Plan Note (Signed)
Reports did not take BP meds this AM, having difficulty with insurance but will contact PCP this AM.

## 2019-05-03 NOTE — Assessment & Plan Note (Signed)
S/p TAH 01/2019, doing well, benign path.

## 2019-05-03 NOTE — Telephone Encounter (Signed)
Routed message to PCP.  Valerie Bauer, [place]

## 2019-05-03 NOTE — Progress Notes (Signed)
   GYNECOLOGY OFFICE VISIT NOTE  History:   Valerie Bauer is a 49 y.o. [phone] here today for follow up of TAH.  Here for f/u of TAH done 01/2019 Still having some numb sensation around incision Still having intermittent abdominal pain 2-3x/week, takes ibuprofen and it improves, exacerbated when she takes long walks Endorses regular bowel movements  Past Medical History:  Diagnosis Date  . Abnormal uterine bleeding (AUB)   . Diabetes mellitus without complication ([place]) 99991111  . Fibroids    Uterine  . HSV infection 2011   dx 2011  . Hypertension 2017  . Hypothyroidism   . Pelvic pain   . Pneumonia     Past Surgical History:  Procedure Laterality Date  . BREAST BIOPSY Left 11/20/2009  . CRYOABLATION  2014   cervical cryoablation ~ 2014  . HYSTERECTOMY ABDOMINAL WITH SALPINGECTOMY Bilateral 02/09/2019   Procedure: HYSTERECTOMY ABDOMINAL WITH SALPINGECTOMY;  Surgeon: Sloan Leiter, MD;  Location: [facility];  Service: Gynecology;  Laterality: Bilateral;  . IR RADIOLOGIST EVAL & MGMT  12/23/2018  . TUBAL LIGATION  1999    The following portions of the patient's history were reviewed and updated as appropriate: allergies, current medications, past family history, past medical history, past social history, past surgical history and problem list.   Health Maintenance:  Normal mammogram on 03/2018.   Review of Systems:  Pertinent items noted in HPI and remainder of comprehensive ROS otherwise negative.  Physical Exam:  BP (!) 170/94   Pulse 93   Wt 147 lb (66.7 kg)   LMP 01/30/2019   BMI 28.71 kg/m  CONSTITUTIONAL: Well-developed, well-nourished female in no acute distress.  HEENT:  Normocephalic, atraumatic. External right and left ear normal. No scleral icterus.  NECK: Normal range of motion, supple, no masses noted on observation SKIN: No rash noted. Not diaphoretic. No erythema. No pallor. MUSCULOSKELETAL: Normal range of motion. No edema noted. NEUROLOGIC: Alert and  oriented to person, place, and time. Normal muscle tone coordination. PSYCHIATRIC: Normal mood and affect. Normal behavior. Normal judgment and thought content. RESPIRATORY: Effort normal, no problems with respiration noted ABDOMEN: No masses noted. No other overt distention noted. Soft, nontender to palpation. PELVIC: Deferred  Labs and Imaging No results found for this or any previous visit (from the past 168 hour(s)). No results found.    Assessment and Plan:   Problem List Items Addressed This Visit      Cardiovascular and Mediastinum   Primary hypertension    Reports did not take BP meds this AM, having difficulty with insurance but will contact PCP this AM.         Genitourinary   Abnormal uterine bleeding (AUB) - Primary    S/p TAH 01/2019, doing well, benign path.         Other   RESOLVED: Well woman exam    Due for mammogram, ordered today      Relevant Orders   MM 3D SCREEN BREAST BILATERAL      Routine preventative health maintenance measures emphasized. Please refer to After Visit Summary for other counseling recommendations.   Return in about 1 year (around 05/02/2020) for Annual Wellness Visit.    Total face-to-face time with patient: 20 minutes.  Over 50% of encounter was spent on counseling and coordination of care.   Augustin Coupe, [place] for [employer], [place]

## 2019-05-03 NOTE — Assessment & Plan Note (Signed)
Due for mammogram, ordered today. °

## 2019-05-06 ENCOUNTER — Other Ambulatory Visit: Payer: Self-pay | Admitting: *Deleted

## 2019-05-06 DIAGNOSIS — E1159 Type 2 diabetes mellitus with other circulatory complications: Secondary | ICD-10-CM

## 2019-05-06 DIAGNOSIS — E1169 Type 2 diabetes mellitus with other specified complication: Secondary | ICD-10-CM

## 2019-05-06 DIAGNOSIS — I152 Hypertension secondary to endocrine disorders: Secondary | ICD-10-CM

## 2019-05-06 DIAGNOSIS — E785 Hyperlipidemia, unspecified: Secondary | ICD-10-CM

## 2019-05-06 MED ORDER — LOSARTAN POTASSIUM 50 MG PO TABS: 50.0000 mg | ORAL_TABLET | Freq: Every day | ORAL | 3 refills | Status: DC

## 2019-05-06 MED ORDER — ATORVASTATIN CALCIUM 40 MG PO TABS: 40.0000 mg | ORAL_TABLET | Freq: Every day | ORAL | 3 refills | Status: DC

## 2019-05-19 ENCOUNTER — Encounter: Payer: Self-pay | Admitting: Family Medicine

## 2019-05-26 ENCOUNTER — Telehealth: Payer: Self-pay | Admitting: *Deleted

## 2019-05-26 NOTE — Telephone Encounter (Signed)
Would you please print a copy of the Emotional Support letter written for this patient in the "letter" tab and place up front for patient to pick up. I have sent a MyChart message as well, but would you also notify her by phone that the letter is available for pick up? Thank you!

## 2019-05-26 NOTE — Telephone Encounter (Signed)
Pt informed to pick up letter. Georgeanne Frankland Kennon Holter, CMA

## 2019-05-27 ENCOUNTER — Other Ambulatory Visit: Payer: Self-pay

## 2019-05-27 ENCOUNTER — Ambulatory Visit
Admission: RE | Admit: 2019-05-27 | Discharge: 2019-05-27 | Disposition: A | Discharge status: Home / Self Care | Payer: 59 | Source: Ambulatory Visit | Attending: Family Medicine | Admitting: Family Medicine

## 2019-05-27 DIAGNOSIS — Z01419 Encounter for gynecological examination (general) (routine) without abnormal findings: Secondary | ICD-10-CM

## 2019-05-27 IMAGING — MG DIGITAL SCREENING BILAT W/ TOMO W/ CAD
8 series · 8 of 24 positions shown · non-contrast
Comparison: Previous exam(s).

CLINICAL DATA: Screening.

EXAM:
DIGITAL SCREENING BILATERAL MAMMOGRAM WITH TOMO AND CAD

[R MLO synth-2D]
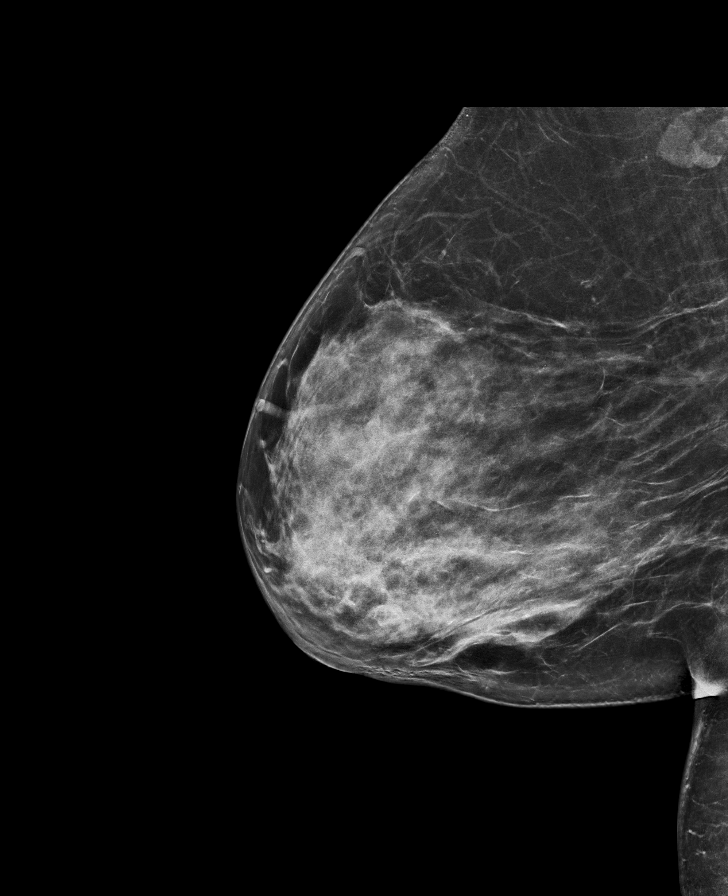

[L MLO synth-2D]
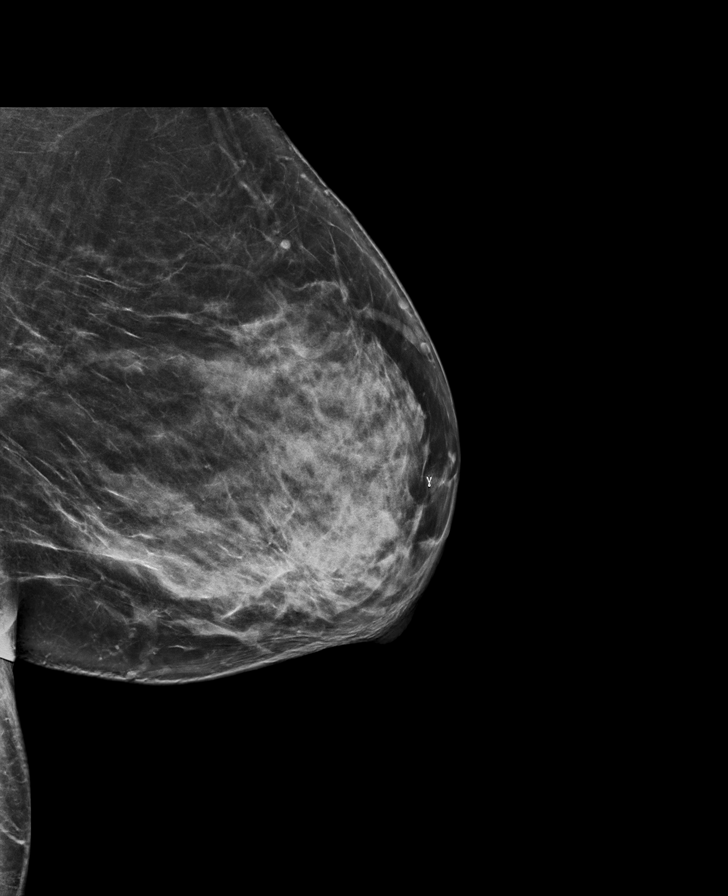

[L CC synth-2D]
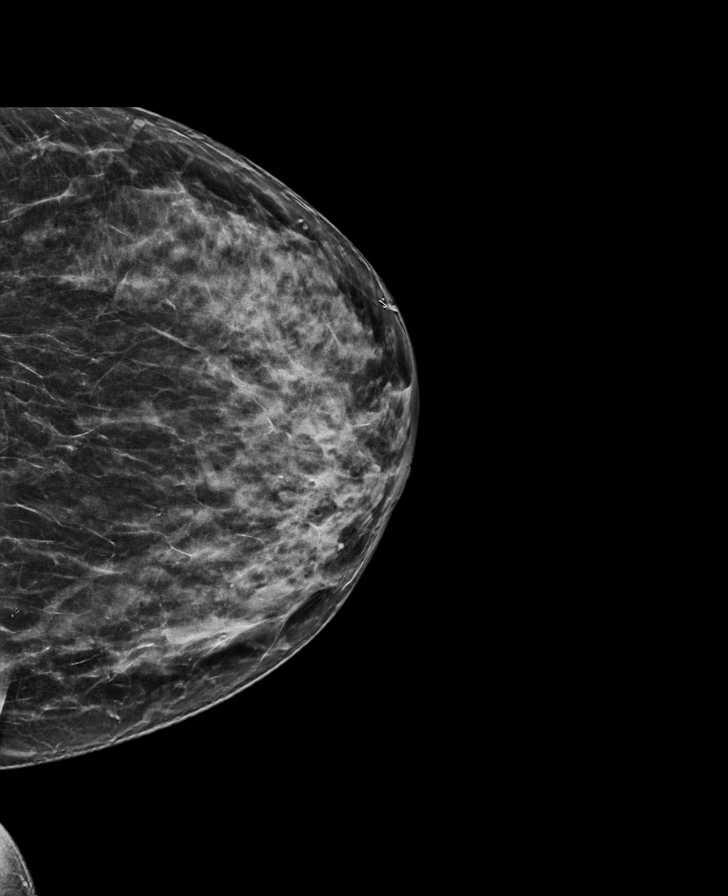

[R CC synth-2D]
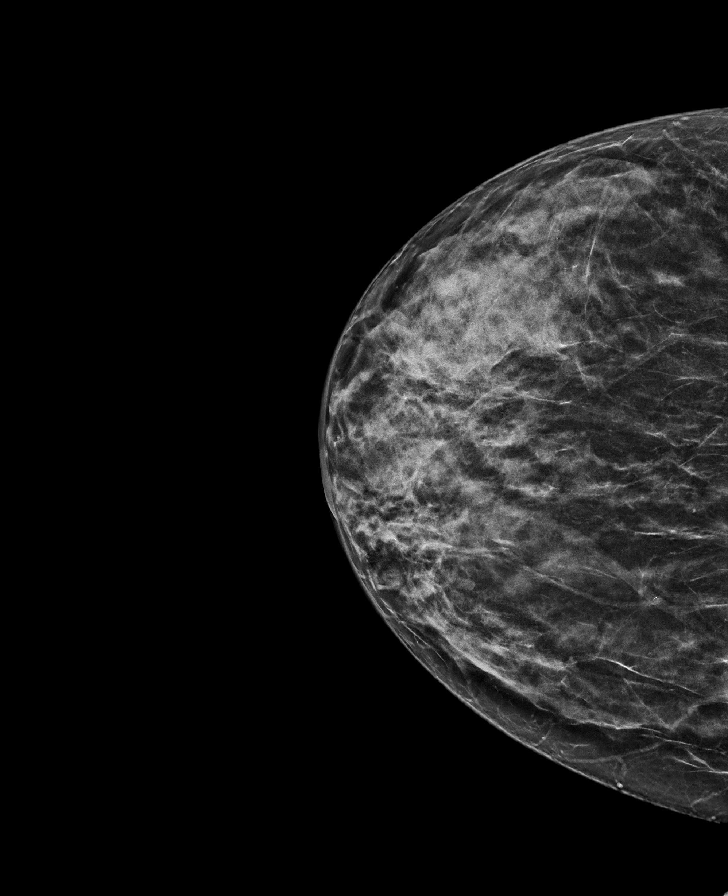

[L MLO tomo · tomo slice 38/75.0]
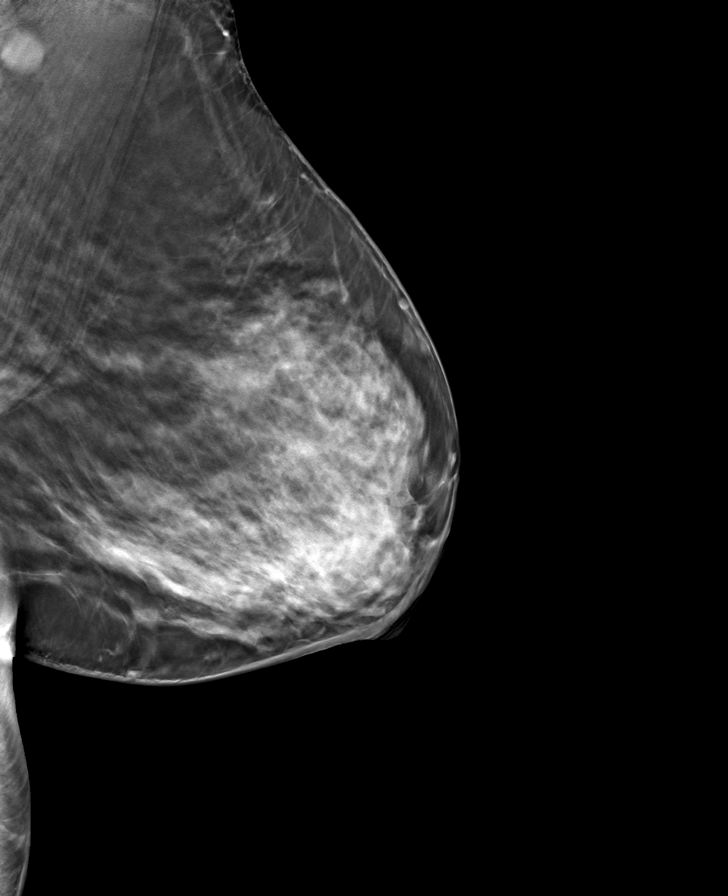

[L CC tomo · tomo slice 33/65.0]
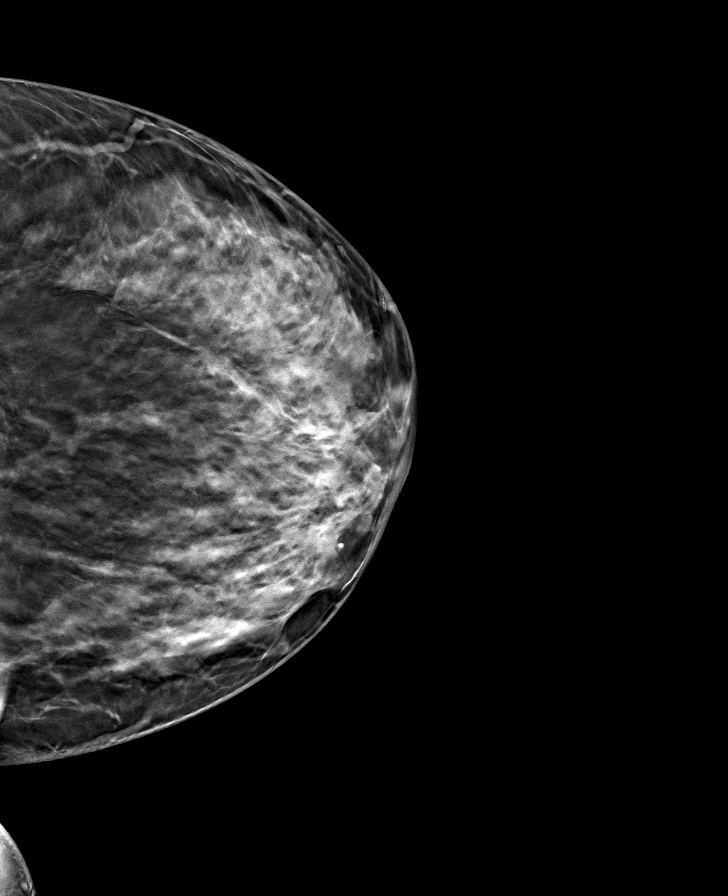

[R CC tomo · tomo slice 31/62.0]
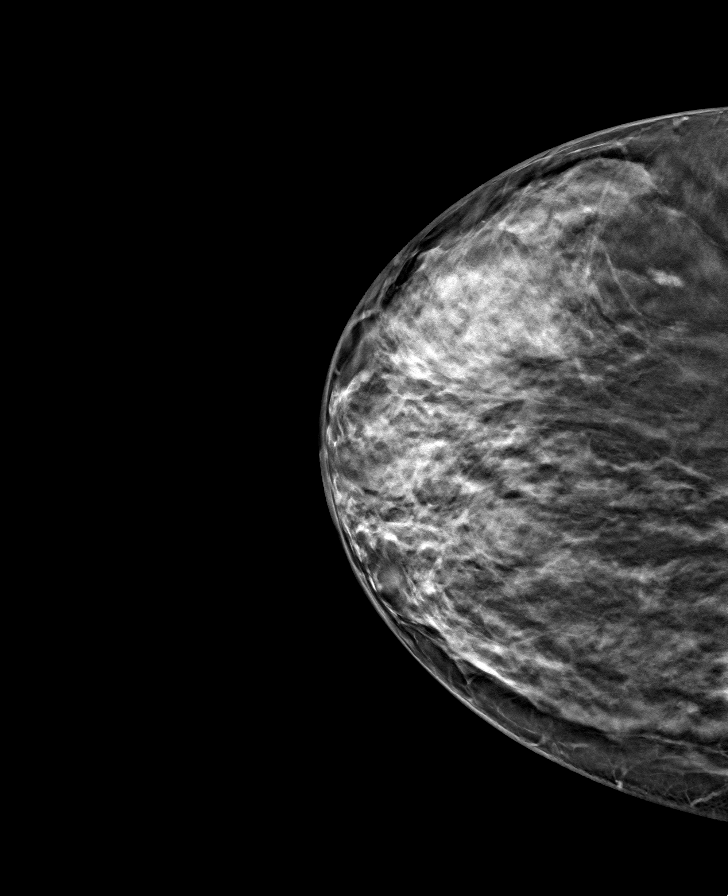

[R MLO tomo · tomo slice 39/76.0]
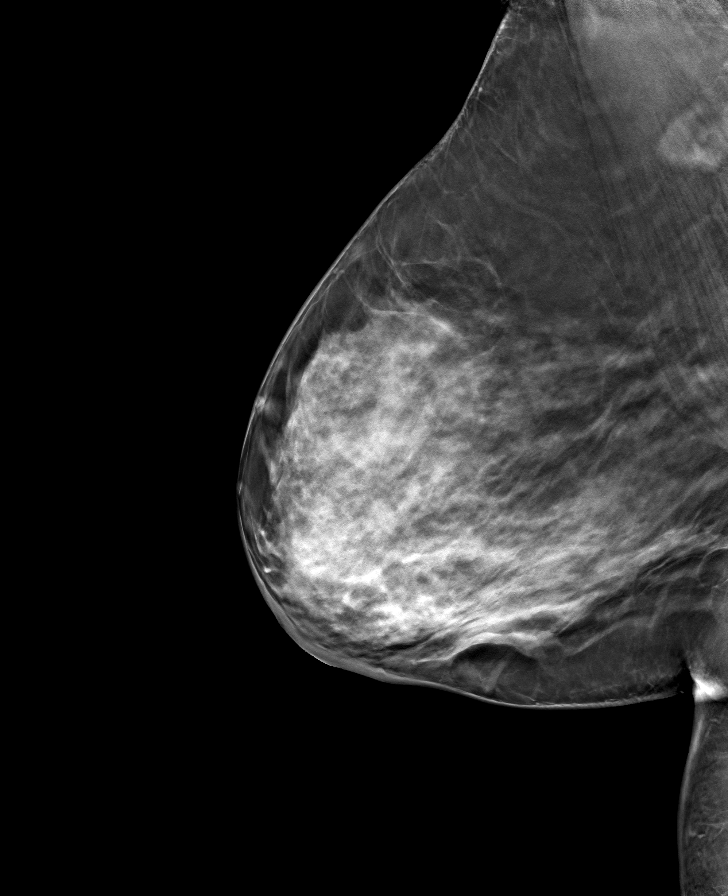

[8 of 24 positions shown; findings below may reference images not displayed]

ACR Breast Density Category c: The breast tissue is heterogeneously
dense, which may obscure small masses.
FINDINGS: There are no findings suspicious for malignancy. Images were
processed with CAD.
IMPRESSION: No mammographic evidence of malignancy. A result letter of this
screening mammogram will be mailed directly to the patient.

RECOMMENDATION:
Screening mammogram in one year. (Code:[5V])

BI-RADS CATEGORY  1: Negative.

## 2019-05-28 ENCOUNTER — Encounter: Payer: Self-pay | Admitting: Family Medicine

## 2019-05-29 NOTE — Progress Notes (Signed)
   Subjective:   Patient ID: Valerie Bauer    DOB: [birth year], 49 y.o. female   MRN: [id number]  Maymunah Puccia is a 49 y.o. female with a history of hypertension, hyperlipidemia, hypothyroidism, type 2 diabetes, iron deficiency anemia, obesity here for emotional support animal.  Emotional Support Animal: Patient here today to request form completion for support animal in her apartment complex. She notes she lost her mother 6 years ago and has used her animal for grief therapy. Particularly in the month of April and May the anxiety and depression worsened. The intermittent holidays and birthdays and well as breast cancer month are also very difficult as her mom passed away from breast cancer. She finds herself staying in all day, locking her door, crying. She doesn't have any siblings to share this grief with. She notes that when she has those off days, patient notes her dog provides the emotional support and companionship to help manage her grief, depression, and anxiety. She noted her symptoms have been worse this year during the pandemic due to being home alone more with less outlets. Her support animal is a Writer that is well behaved, does not bark. She is safe with children.  Patient experienced Covid 19 infection requiring hospitalization and has had exacerbation of her symptoms more over the past year since her sickness.  Review of Systems:  Per HPI.   Objective:   BP (!) 146/80   Pulse 88   Ht 5' (1.524 m)   Wt 184 lb 9.6 oz (83.7 kg)   LMP 01/30/2019   SpO2 99%   BMI 36.05 kg/m  Vitals and nursing note reviewed.  General: pleasant older female, well nourished, well developed, in no acute distress with non-toxic appearance Resp: speaking in full sentences, breathing comfortably on room air MSK: gait normal Neuro: Alert and oriented, speech normal  Assessment & Plan:   Depression/Anxiety/Grief  Emotional Support Animal: Patient here today to further discuss  need for emotional support animal.  Patient continues to suffer from grief after the loss of her mother with intermittent episodes of severe depression and anxiety, particularly over the last year during the pandemic.  Her emotional support animal provides support and companionship that helps her manage the symptoms.  Patient would prefer to defer pharmacologic treatment.  Will provide letter and required forms and fax to appropriate location.  Type 2 Diabetes  HTN  HLD: Patient to follow up in 2 to 3 weeks repeat labs and evaluation.  [name], DO PGY-2, [facility] 06/03/2019 1:02 PM

## 2019-06-01 ENCOUNTER — Other Ambulatory Visit: Payer: Self-pay

## 2019-06-01 ENCOUNTER — Ambulatory Visit: Payer: 59 | Admitting: Family Medicine

## 2019-06-01 ENCOUNTER — Encounter: Payer: Self-pay | Admitting: Family Medicine

## 2019-06-01 VITALS — BP 146/80 | HR 88 | Ht 60.0 in | Wt 184.6 lb

## 2019-06-01 DIAGNOSIS — F339 Major depressive disorder, recurrent, unspecified: Secondary | ICD-10-CM

## 2019-06-01 DIAGNOSIS — F4321 Adjustment disorder with depressed mood: Secondary | ICD-10-CM

## 2019-06-15 ENCOUNTER — Ambulatory Visit: Payer: 59 | Admitting: Family Medicine

## 2019-06-15 NOTE — Progress Notes (Deleted)
Lipid   Subjective:   Patient ID: Valerie Bauer    DOB: [birth year], 49 y.o. female   MRN: [id number]  Valerie Bauer is a 49 y.o. female with a history of pretension, hyperlipidemia, hypothyroidism, type 2 diabetes, obesity, iron deficiency anemia here for diabetes and hypertension follow-up.  Diabetes: Last three A1C's below. Currently on Jardiance 10 mg daily, Metformin 1000 mg twice daily, Trulicity 0.7mg  SQ q. weekly. Endorses compliance. Notes CBGs range ***. Denies any hypoglycemia. Denies any polyuria, polydipsia, polyphagia. Due for diabetic foot exam and eye exam.  Lab Results  Component Value Date   HGBA1C 6.5 12/25/2018   HGBA1C 10.5 (H) 09/20/2018   HGBA1C 8.7 (A) 07/01/2018    HTN:    today. Currently on losartan 50 mg daily. Endorses compliance. Denies any chest pain, SOB, vision changes, or headaches.    HLD: Last lipid panel below. Currently on atorvastatin 40 mg daily. Endorses compliance. Denies any muscles aches or weakness.  Lab Results  Component Value Date   CHOL 121 05/06/2018   HDL 39 (L) 05/06/2018   LDLCALC 67 05/06/2018   TRIG 167 (H) 09/19/2018   CHOLHDL 3.1 05/06/2018   Iron Deficiency Anemia: Currently on ferrous sulfate 325 mg daily.  No iron panel in chart. Will obtain today to ensure adequate response.  Hypothyroidism: Last TSH 2.52.  Currently on Synthroid 125 MCG's daily.  Dors is compliance.  Denies any symptoms such as diarrhea, constipation, dry skin, hair loss, tachycardia, excessively hot/cold.  Review of Systems:  Per HPI.   Objective:   LMP 01/30/2019  Vitals and nursing note reviewed.  General: well nourished, well developed, in no acute distress with non-toxic appearance HEENT: normocephalic, atraumatic, moist mucous membranes Neck: supple, non-tender without lymphadenopathy CV: regular rate and rhythm without murmurs, rubs, or gallops, no lower extremity edema Lungs: clear to auscultation bilaterally with normal  work of breathing Abdomen: soft, non-tender, non-distended, no masses or organomegaly palpable, normoactive bowel sounds Skin: warm, dry, no rashes or lesions Extremities: warm and well perfused, normal tone MSK: ROM grossly intact, strength intact, gait normal Neuro: Alert and oriented, speech normal  Diabetic foot exam was performed with the following findings:   No data filed       Assessment & Plan:   No problem-specific Assessment & Plan notes found for this encounter.  No orders of the defined types were placed in this encounter.  No orders of the defined types were placed in this encounter.  Due for diabetic eye exam  [name], DO PGY-2, [facility] 06/15/2019 8:15 AM

## 2019-07-06 ENCOUNTER — Ambulatory Visit: Payer: 59 | Admitting: Family Medicine

## 2019-08-02 NOTE — Progress Notes (Deleted)
   Subjective:   Patient ID: Valerie Bauer    DOB: 1970/10/15, 49 y.o. female   MRN: [id number]  Valerie Bauer is a 49 y.o. female with a history of T2DM, HTN, HLD, acquired hypothyroidism, iron deficiency anemia, obesity here for check up.  Diabetes: Last three A1C's below. Currently on Jardiance 10mg  QD, Metformin 1000mg  BID, Trulicity 0.75mg  q weekly. Endorses compliance. Notes CBGs range ***. Denies any hypoglycemia. Denies any polyuria, polydipsia, polyphagia. Due for diabetic foot exam.  Lab Results  Component Value Date   HGBA1C 6.5 12/25/2018   HGBA1C 10.5 (H) 09/20/2018   HGBA1C 8.7 (A) 07/01/2018    HTN:    today. Currently on Losartan 50mg  QD. Endorses compliance. Denies any chest pain, SOB, vision changes, or headaches.    HLD: Last lipid panel below. Currently on atorvastatin 40mg  QD. Endorses compliance. Denies any muscles aches or weakness.  Lab Results  Component Value Date   CHOL 121 05/06/2018   HDL 39 (L) 05/06/2018   LDLCALC 67 05/06/2018   TRIG 167 (H) 09/19/2018   CHOLHDL 3.1 05/06/2018   Iron Deficiency Anemia: Last Hgb 9.7 in 01/2019. Baseline 9.0. Currently on iron supplement daily. Endorses compliance. Denies any active bleeding, melena, hematochezia, hematemesis, or hematuria.  Acquired Hypothyroidism: Last TSH 2.52 in March 2020. Currently on Synthroid 184mcg QD. Endorses compliance. Denies any heat/cold intolerance, skin or hair changes, diarrhea/constipation.  Health Maintenance: Due for Heaptitic C screening and COVID vaccine. Due for diabetic foot exam.  Review of Systems:  Per HPI.   Objective:   LMP 01/30/2019  Vitals and nursing note reviewed.  General: well nourished, well developed, in no acute distress with non-toxic appearance HEENT: normocephalic, atraumatic, moist mucous membranes Neck: supple, non-tender without lymphadenopathy CV: regular rate and rhythm without murmurs, rubs, or gallops, no lower extremity  edema Lungs: clear to auscultation bilaterally with normal work of breathing Abdomen: soft, non-tender, non-distended, no masses or organomegaly palpable, normoactive bowel sounds Skin: warm, dry, no rashes or lesions Extremities: warm and well perfused, normal tone MSK: ROM grossly intact, strength intact, gait normal Neuro: Alert and oriented, speech normal  Assessment & Plan:   No problem-specific Assessment & Plan notes found for this encounter.  No orders of the defined types were placed in this encounter.  No orders of the defined types were placed in this encounter.   [name], DO PGY-2, [facility] 08/02/2019 9:43 PM

## 2019-08-05 ENCOUNTER — Ambulatory Visit: Payer: 59 | Admitting: Family Medicine

## 2019-09-23 ENCOUNTER — Other Ambulatory Visit: Payer: 59

## 2019-09-23 ENCOUNTER — Other Ambulatory Visit: Payer: Self-pay

## 2019-09-23 DIAGNOSIS — Z20822 Contact with and (suspected) exposure to covid-19: Secondary | ICD-10-CM

## 2019-09-24 LAB — NOVEL CORONAVIRUS, NAA: SARS-CoV-2, NAA: NOT DETECTED

## 2019-09-24 LAB — SARS-COV-2, NAA 2 DAY TAT

## 2019-11-22 ENCOUNTER — Encounter: Payer: Self-pay | Admitting: Family Medicine

## 2019-11-30 ENCOUNTER — Other Ambulatory Visit: Payer: Self-pay

## 2019-11-30 ENCOUNTER — Ambulatory Visit (INDEPENDENT_AMBULATORY_CARE_PROVIDER_SITE_OTHER): Payer: 59 | Admitting: Family Medicine

## 2019-11-30 ENCOUNTER — Encounter: Payer: Self-pay | Admitting: Family Medicine

## 2019-11-30 VITALS — BP 148/92 | HR 79 | Wt 188.6 lb

## 2019-11-30 DIAGNOSIS — N6452 Nipple discharge: Secondary | ICD-10-CM

## 2019-11-30 DIAGNOSIS — E039 Hypothyroidism, unspecified: Secondary | ICD-10-CM | POA: Diagnosis not present

## 2019-11-30 DIAGNOSIS — E119 Type 2 diabetes mellitus without complications: Secondary | ICD-10-CM

## 2019-11-30 DIAGNOSIS — E785 Hyperlipidemia, unspecified: Secondary | ICD-10-CM

## 2019-11-30 DIAGNOSIS — R590 Localized enlarged lymph nodes: Secondary | ICD-10-CM

## 2019-11-30 DIAGNOSIS — E1169 Type 2 diabetes mellitus with other specified complication: Secondary | ICD-10-CM

## 2019-11-30 DIAGNOSIS — D509 Iron deficiency anemia, unspecified: Secondary | ICD-10-CM | POA: Diagnosis not present

## 2019-11-30 DIAGNOSIS — E669 Obesity, unspecified: Secondary | ICD-10-CM

## 2019-11-30 DIAGNOSIS — I10 Essential (primary) hypertension: Secondary | ICD-10-CM

## 2019-11-30 LAB — POCT GLYCOSYLATED HEMOGLOBIN (HGB A1C): HbA1c, POC (controlled diabetic range): 12.6 % — AB (ref 0.0–7.0)

## 2019-11-30 NOTE — Assessment & Plan Note (Signed)
-   recheck lipid panel as it has been 2 years since last checked

## 2019-11-30 NOTE — Assessment & Plan Note (Signed)
-   pt notes not taking iron regularly, will recheck CBC and ferritin today - s/p TAH in November 2020

## 2019-11-30 NOTE — Assessment & Plan Note (Signed)
-   Hypertensive today, repeat BP 142/96, but patient states has not yet taken AM medications because she has been fasting - continue home Losartan, rechecking BMP today

## 2019-11-30 NOTE — Assessment & Plan Note (Signed)
-   pt notes clear unilateral left breast discharge starting April 2021, two to three nights per week - had normal screening mammogram 05/27/19, but I discussed with her today based on symptoms of unilateral discharge I would recommend doing diagnostic mammogram - however, with recent COVID vaccination and axillary lymphadenopathy that I suspect is reactive to vaccination, increased risk of false positive mammogram results - through shared decision making, discussed will follow-up in 10 days, if LAD persists, or increases in size, plan to proceed with diagnostic mammogram, but  if LAD resolves or decreasing in size, will plan for diagnostic mammogram 6-8 weeks post COVID vaccination to decrease risk of false positive results - all questions and concerns addressed

## 2019-11-30 NOTE — Assessment & Plan Note (Signed)
-   will recheck TSH today as it has been >2 yrs since last TSH

## 2019-11-30 NOTE — Patient Instructions (Addendum)
It was wonderful to see you today.  Please bring ALL of your medications with you to every visit.   Today we talked about:  - Your A1c was 12.6 today, we discussed continuing your metformin 1000mg  BID, Trulicity once weekly, but we will increase your Jardiance to 25mg  daily as long as your kidney function is normal today - Continue to work on diet and exercise - We will continue to monitor the swelling of your lymph notes, if they are increasing in size, you have fevers or chills, or drainage please call the clinic sooner than your next appt in 2 weeks   Thank you for choosing [facility].   Please call [phone] with any questions about today's appointment.  Please be sure to schedule follow up at the front  desk before you leave today.   Yehuda Savannah, MD  Family Medicine

## 2019-11-30 NOTE — Assessment & Plan Note (Addendum)
-  POC Hgb A1c 12.6 today, pt states not using Trulicity every week due to cost but has the money to refill today, will continue Trulicity 4.30TU once weekly - she is asymptomatic, and is taking metformin and Jardiance daily - Will check BMP today, assuming appropriate creatinine and eGFR will increase Jardiance 39m qday, check BG and discuss at follow-up appointment - checking urine microalbumin as well, on losartan - checking lipid panel as well, on atorvastatin - PPSV23 up to date - due for foot exam at follow up appt, will also discuss eye exam at that time

## 2019-11-30 NOTE — Assessment & Plan Note (Addendum)
-   Noted several days after first COVID-19 vaccination on 11/20/19, and persisting today 10 days post vaccination - suspect reactive LAD post vaccination, will follow-up in 10 days for monitoring size/symptoms, instructed to call if systemic symptoms, worsening pain or breast pain, fevers, chills - plan as below for history of breast discharge, suspect axillary LAD not related due to timing with COVID vaccination - We did discuss for Valerie Bauer second COVID-19 vaccination to plan for injection in right arm so we can continue to monitor the left axillary lymphadenopathy

## 2019-11-30 NOTE — Progress Notes (Signed)
SUBJECTIVE:   CHIEF COMPLAINT / HPI:   [name] is a 49 yo female with PMH T2DM, HTN, hyperlipidemia, iron deficiency anemia who presents for acute concern of left axillary swelling and soreness.  Left axillary swelling-she notes that she received her first Covid vaccination on 11/20/2019, and a few days after noticed swelling and pain in her left axilla.  She thought this could be related to the vaccine but it has not resolved so she wanted to come in to be examined.  She denies any fevers or chills or systemic systems.  She denies other sites of swelling or pain.  Left breast pain and discharge-she notes that starting in April 2021 she started having a small amount of pain around her left nipple with clear discharge that she notices on her pajamas about 2 times per week.  She denies any blood or pus in this discharge.  She denies any redness or swelling but does note that the area is sore to touch.  She denies any masses or other lymph nodes noted before this most recent lymphadenopathy described above after the Covid vaccination.  Her mom was diagnosed at age 52 with stage IV breast cancer and died in 21-May-2013, she notes she has had 1 previous breast biopsy that was benign in 2009-05-21.  She recently had a screening mammogram 05/27/2019 that was normal.  Iron deficiency anemia-she notes she only takes her iron sometimes and often forgets.  She did have a total abdominal hysterectomy in December 2020.  Per chart review her last ferritin in August 2020 was 20, and her last hemoglobin in December 2020 was 9.7.  T2DM-she notes that she has been taking her metformin and Jardiance daily, but does not always remember the Trulicity and also it is expensive so has probably missed 1 or 2 doses per month.  She also notes she has not been eating like she knows that she shows.  She denies any side effects from her medications, has no trouble giving herself the injections, and denies any GI side effects from her  Metformin.  She denies any urinary tract infections, dysuria, polyuria, peripheral numbness and tingling.  She is willing to do lab work today as it has been a while since we have seen her for her diabetes.  Hyperlipidemia-she has been taking her home atorvastatin without side effects.  Hypothyroidism-she has been taking her home Synthroid without side effects.  Has not had her TSH checked in over 2 years.  PERTINENT  PMH / PSH:  History of breast biopsy, benign in 05-21-09 per patient  Mother has history of breast cancer, diagnosed at age 110 with stage IV, and died in 75  OBJECTIVE:   BP (!) 148/92   Pulse 79   Wt 188 lb 9.6 oz (85.5 kg)   LMP 01/30/2019   SpO2 99%   BMI 36.83 kg/m   General: A&O, NAD HEENT: No sign of trauma, EOM grossly intact Breast: left breast without skin changes including erythema, rash, or pitting/induration. Left breast exam with fibrous breast tissue, no palpable masses, small amount of clear discharge able to be expressed from breast. Left axilla with multiple small mobile lymph nodes, no induration or drainage appreciated. No supraclavicular or cervical lymph notes appreciated. Respiratory: normal WOB GI:  non-distended  Neuro: Normal gait, moves all four extremities appropriately. Psych: Appropriate mood and affect   ASSESSMENT/PLAN:   Axillary lymphadenopathy - Noted several days after first COVID-19 vaccination on 11/20/19, and persisting today 10 days  post vaccination - suspect reactive LAD post vaccination, will follow-up in 10 days for monitoring size/symptoms, instructed to call if systemic symptoms, worsening pain or breast pain, fevers, chills - plan as below for history of breast discharge, suspect axillary LAD not related due to timing with COVID vaccination - We did discuss for her second COVID-19 vaccination to plan for injection in right arm so we can continue to monitor the left axillary lymphadenopathy  Discharge from breast - pt notes  clear unilateral left breast discharge starting April 2021, two to three nights per week - had normal screening mammogram 05/27/19, but I discussed with her today based on symptoms of unilateral discharge I would recommend doing diagnostic mammogram - however, with recent COVID vaccination and axillary lymphadenopathy that I suspect is reactive to vaccination, increased risk of false positive mammogram results - through shared decision making, discussed will follow-up in 10 days, if LAD persists, or increases in size, plan to proceed with diagnostic mammogram, but  if LAD resolves or decreasing in size, will plan for diagnostic mammogram 6-8 weeks post COVID vaccination to decrease risk of false positive results - all questions and concerns addressed  Iron deficiency anemia - pt notes not taking iron regularly, will recheck CBC and ferritin today - s/p TAH in November 2020  Hypothyroidism (acquired) - will recheck TSH today as it has been >2 yrs since last TSH  Hyperlipidemia associated with type 2 diabetes mellitus ([place]) - recheck lipid panel as it has been 2 years since last checked  Primary hypertension - Hypertensive today, repeat BP 142/96, but patient states has not yet taken AM medications because she has been fasting - continue home Losartan, rechecking BMP today  Type 2 diabetes mellitus without complication, without long-term current use of insulin ([facility]CC) - POC Hgb A1c 12.6 today, pt states not using Trulicity every week due to cost but has the money to refill today, will continue Trulicity 0.75mg  once weekly - she is asymptomatic, and is taking metformin and Jardiance daily - Will check BMP today, assuming appropriate creatinine and eGFR will increase Jardiance 25mg  qday, check BG and discuss at follow-up appointment - checking urine microalbumin as well, on losartan - checking lipid panel as well, on atorvastatin - PPSV23 up to date - due for foot exam at follow up appt, will also  discuss eye exam at that time    Influenza vaccination offered today and patient declined.  Lenoria Chime, MD [place]

## 2019-12-01 ENCOUNTER — Other Ambulatory Visit: Payer: Self-pay | Admitting: Family Medicine

## 2019-12-01 ENCOUNTER — Telehealth: Payer: Self-pay | Admitting: Family Medicine

## 2019-12-01 DIAGNOSIS — E119 Type 2 diabetes mellitus without complications: Secondary | ICD-10-CM

## 2019-12-01 LAB — CBC
Hematocrit: 44.4 % (ref 34.0–46.6)
Hemoglobin: 14.2 g/dL (ref 11.1–15.9)
MCH: 28.3 pg (ref 26.6–33.0)
MCHC: 32 g/dL (ref 31.5–35.7)
MCV: 89 fL (ref 79–97)
Platelets: 291 10*3/uL (ref 150–450)
RBC: 5.01 x10E6/uL (ref 3.77–5.28)
RDW: 12.2 % (ref 11.7–15.4)
WBC: 6 10*3/uL (ref 3.4–10.8)

## 2019-12-01 LAB — LIPID PANEL
Chol/HDL Ratio: 2.8 ratio (ref 0.0–4.4)
Cholesterol, Total: 170 mg/dL (ref 100–199)
HDL: 60 mg/dL (ref >39)
LDL Chol Calc (NIH): 93 mg/dL (ref 0–99)
Triglycerides: 90 mg/dL (ref 0–149)
VLDL Cholesterol Cal: 17 mg/dL (ref 5–40)

## 2019-12-01 LAB — BASIC METABOLIC PANEL
BUN/Creatinine Ratio: 16 (ref 9–23)
BUN: 12 mg/dL (ref 6–24)
CO2: 24 mmol/L (ref 20–29)
Calcium: 8.8 mg/dL (ref 8.7–10.2)
Chloride: 101 mmol/L (ref 96–106)
Creatinine, Ser: 0.76 mg/dL (ref 0.57–1.00)
GFR calc Af Amer: 107 mL/min/{1.73_m2} (ref >59)
GFR calc non Af Amer: 92 mL/min/{1.73_m2} (ref >59)
Glucose: 313 mg/dL — ABNORMAL HIGH (ref 65–99)
Potassium: 3.8 mmol/L (ref 3.5–5.2)
Sodium: 138 mmol/L (ref 134–144)

## 2019-12-01 LAB — MICROALBUMIN / CREATININE URINE RATIO
Creatinine, Urine: 93.8 mg/dL
Microalb/Creat Ratio: 47 mg/g creat — ABNORMAL HIGH (ref 0–29)
Microalbumin, Urine: 44 ug/mL

## 2019-12-01 LAB — FERRITIN: Ferritin: 70 ng/mL (ref 15–150)

## 2019-12-01 LAB — TSH: TSH: 11.2 u[IU]/mL — ABNORMAL HIGH (ref 0.450–4.500)

## 2019-12-01 MED ORDER — IBUPROFEN 600 MG PO TABS: 600.0000 mg | ORAL_TABLET | Freq: Three times a day (TID) | ORAL | 0 refills | Status: DC | PRN

## 2019-12-01 MED ORDER — LEVOTHYROXINE SODIUM 125 MCG PO TABS: 125.0000 ug | ORAL_TABLET | ORAL | 3 refills | Status: DC

## 2019-12-01 MED ORDER — EMPAGLIFLOZIN 25 MG PO TABS: 25.0000 mg | ORAL_TABLET | Freq: Every day | ORAL | 3 refills | Status: DC

## 2019-12-01 NOTE — Telephone Encounter (Signed)
Called patient and discussed labwork, CBC showing normal Hgb, ferritin still pending.  BMP with normal kidney function, I confirmed she is indeed taking metformin and Jardiance daily, will increase Jardiance to 25mg /day and see her back at her next appointment to recheck BMP and ensure tolerance. She has not picked up her Trulicity refill yet and notes sometimes it is too expensive.  I discussed her TSH was elevated, she states she has not taken the Synthroid for the last two weeks due to being out. I have sent in a new prescription and discussed we will recheck her TSH after 4-6 weeks of taking the synthroid 125 mcg/daily.   Yehuda Savannah MD

## 2019-12-06 ENCOUNTER — Other Ambulatory Visit: Payer: Self-pay | Admitting: *Deleted

## 2019-12-06 ENCOUNTER — Other Ambulatory Visit: Payer: Self-pay | Admitting: Family Medicine

## 2019-12-06 DIAGNOSIS — E039 Hypothyroidism, unspecified: Secondary | ICD-10-CM

## 2019-12-06 MED ORDER — LEVOTHYROXINE SODIUM 125 MCG PO CAPS: 125.0000 ug | ORAL_CAPSULE | Freq: Every day | ORAL | 2 refills | Status: DC

## 2019-12-06 NOTE — Telephone Encounter (Signed)
Not entirely sure how to call in a different brand. But I called in a generic Levothyroxine in hopes that will fix the issue.

## 2019-12-06 NOTE — Telephone Encounter (Signed)
Per pharmacy they no longer can get the brand she was on. Wants you to send in the provell brand. Jaliza Seifried Kennon Holter, CMA

## 2019-12-13 ENCOUNTER — Other Ambulatory Visit: Payer: Self-pay

## 2019-12-13 ENCOUNTER — Other Ambulatory Visit: Payer: Self-pay | Admitting: Family Medicine

## 2019-12-13 ENCOUNTER — Ambulatory Visit (INDEPENDENT_AMBULATORY_CARE_PROVIDER_SITE_OTHER): Payer: 59 | Admitting: Family Medicine

## 2019-12-13 ENCOUNTER — Encounter: Payer: Self-pay | Admitting: Family Medicine

## 2019-12-13 VITALS — BP 148/100 | HR 91 | Ht 60.0 in | Wt 187.6 lb

## 2019-12-13 DIAGNOSIS — E119 Type 2 diabetes mellitus without complications: Secondary | ICD-10-CM | POA: Diagnosis not present

## 2019-12-13 DIAGNOSIS — E039 Hypothyroidism, unspecified: Secondary | ICD-10-CM | POA: Diagnosis not present

## 2019-12-13 DIAGNOSIS — Z1159 Encounter for screening for other viral diseases: Secondary | ICD-10-CM

## 2019-12-13 DIAGNOSIS — I10 Essential (primary) hypertension: Secondary | ICD-10-CM

## 2019-12-13 DIAGNOSIS — N6452 Nipple discharge: Secondary | ICD-10-CM

## 2019-12-13 DIAGNOSIS — R590 Localized enlarged lymph nodes: Secondary | ICD-10-CM | POA: Diagnosis not present

## 2019-12-13 NOTE — Assessment & Plan Note (Signed)
-   persistent today, will proceed with scheduling diagnostic mammogram, will discuss with center if needing to be 6 weeks out after COVID vaccination

## 2019-12-13 NOTE — Progress Notes (Signed)
    SUBJECTIVE:   CHIEF COMPLAINT / HPI:  Valerie Bauer is a pleasant 49 yo female who presents for follow-up for axillary lymphadenopathy and type II diabetes mellitus.  Left axillary lymphadenopathy- has remained the same since her COVID vaccine on 11/20/19. Not painful. She continues to have breast discharge two to three nights per week from left nipple only. She received her second COVID vaccine on 12/10/19 in her right arm and does not have any pain or armpit fullness/swelling.  T2DM- She has picked up Jardiance 25mg , taking for past week. Has not gotten Trulicity yet but plans to this week. She has not had an eye exam in 4 years but has an office she goes, [facility]. Taking her metformin still.  Hypothyroidism- has not started taking levothyroxine again on still need to go get it from the pharmacy.   HTN- took her losartan 50mg  this morning and has been taking since last visit here. Repeat blood pressure at end of visit was 148/100.  PERTINENT  PMH / PSH: as above  OBJECTIVE:   BP (!) 152/92   Pulse 91   Ht 5' (1.524 m)   Wt 187 lb 9.6 oz (85.1 kg)   LMP 01/30/2019   SpO2 98%   BMI 36.64 kg/m   General: A&O, NAD HEENT: No sign of trauma, EOM grossly intact Axilla: left axilla with mobile lymphadenopathy, largest about 3 mm, no skin changes or tenderness to palpation Respiratory: normal WOB GI: non-distended  Extremities: no peripheral edema. Neuro: Normal gait, moves all four extremities appropriately Skin: no lesions/rashes visualized Psych: Appropriate mood and affect   Dermatologic Exam: Nails: No onchomycosis. No nail bed thickening. Callouses: No callouses. No fissures. Web spaces: No macerations or open lesions Redness/Erythema: None  Musculoskeletal Exam: No bunion, hammertoes, prominent metatarsals, collapsed arch, or previous amputation. Vascular Assessment: Pedal hair growth absent. 2+ posterior tibial and dorsalis pedis pulses. Neurologic Exam:  10-gram monofilament exam, R 6/6 and L 6/6.   ASSESSMENT/PLAN:   Encounter for hepatitis C screening test for low risk patient - Hep c antibody ordered  Axillary lymphadenopathy - persistent today, will proceed with scheduling diagnostic mammogram, will discuss with center if needing to be 6 weeks out after COVID vaccination  Type 2 diabetes mellitus without complication, without long-term current use of insulin ([place]) - normal foot exam today, continue metformin - she will call to make appointment for diabetic eye exam and fax [place] records - checking BMP to assess kidney function at increased dose of Jardiance - she is going to pick up trulicity this week and restart as well - f/u scheduled in one month  Primary hypertension - elevated today, she notes taking losartan 50mg  daily since last appointment - reassessing BMP today on increased dose of Jardiance, if normal kidney function will increase losartan dosage - has follow-up scheduled in one month to reassess blood pressure  Discharge from breast - scheduling diagnostic mammogram   Declined flu vaccine today, discussed can make nurse only appt to get at any time.  Valerie Chime, MD [place]

## 2019-12-13 NOTE — Assessment & Plan Note (Addendum)
-   normal foot exam today, continue metformin - she will call to make appointment for diabetic eye exam and fax [place] records - checking BMP to assess kidney function at increased dose of Jardiance - she is going to pick up trulicity this week and restart as well - f/u scheduled in one month

## 2019-12-13 NOTE — Assessment & Plan Note (Addendum)
-   elevated today, she notes taking losartan 50mg  daily since last appointment - reassessing BMP today on increased dose of Jardiance, if normal kidney function will increase losartan dosage - has follow-up scheduled in one month to reassess blood pressure

## 2019-12-13 NOTE — Patient Instructions (Addendum)
It was wonderful to see you today.  Please bring ALL of your medications with you to every visit.   Today we talked about:  - Armpit swelling from lymph nodes on left side- we have scheduled your diagnostic mammogram for:  January 04, 2020 @ 08:40  [facility]  [phone] - can call for cancellations if you want to try for earlier appointment  - Checking your kidney function to make sure tolerating the increased dose of Jardiance, will send my chart message - Your blood pressure was elevated today, if your kidney function looks okay we will increase your losartan medication, will call you with this plan once I get results back - Can get flu shot anytime just call office for nurse only appt - Checking for hepatitis C today per screening recommendations   Thank you for choosing [facility].   Please call [phone] with any questions about today's appointment. Your follow-up appointment is scheduled for Tuesday 11/30 at 0:25 am with Dr Alba Cory.  Yehuda Savannah, MD  Family Medicine

## 2019-12-13 NOTE — Assessment & Plan Note (Signed)
-   scheduling diagnostic mammogram

## 2019-12-13 NOTE — Assessment & Plan Note (Signed)
-   Hep c antibody ordered

## 2019-12-14 LAB — BASIC METABOLIC PANEL
BUN/Creatinine Ratio: 15 (ref 9–23)
BUN: 13 mg/dL (ref 6–24)
CO2: 20 mmol/L (ref 20–29)
Calcium: 8.6 mg/dL — ABNORMAL LOW (ref 8.7–10.2)
Chloride: 102 mmol/L (ref 96–106)
Creatinine, Ser: 0.84 mg/dL (ref 0.57–1.00)
GFR calc Af Amer: 94 mL/min/{1.73_m2} (ref >59)
GFR calc non Af Amer: 82 mL/min/{1.73_m2} (ref >59)
Glucose: 187 mg/dL — ABNORMAL HIGH (ref 65–99)
Potassium: 3.4 mmol/L — ABNORMAL LOW (ref 3.5–5.2)
Sodium: 136 mmol/L (ref 134–144)

## 2019-12-14 LAB — HEPATITIS C ANTIBODY: Hep C Virus Ab: <0.1 s/co ratio (ref 0.0–0.9)

## 2020-01-04 ENCOUNTER — Ambulatory Visit
Admission: RE | Admit: 2020-01-04 | Discharge: 2020-01-04 | Disposition: A | Discharge status: Home / Self Care | Payer: 59 | Source: Ambulatory Visit | Attending: Family Medicine | Admitting: Family Medicine

## 2020-01-04 ENCOUNTER — Other Ambulatory Visit: Payer: Self-pay | Admitting: Family Medicine

## 2020-01-04 ENCOUNTER — Other Ambulatory Visit: Payer: Self-pay

## 2020-01-04 DIAGNOSIS — N6452 Nipple discharge: Secondary | ICD-10-CM

## 2020-01-04 IMAGING — US US BREAST*L* LIMITED INC AXILLA
1 series · 13 of 13 positions shown · non-contrast
Comparison: Previous exam(s).

CLINICAL DATA: 49-year-old presenting with a spontaneous clear LEFT
nipple discharge that occurs 2-3 nights per week over the past 7-8
months. The patient states that the discharge arises from a single
spot on the nipple. The patient had her initial [DQ] vaccine in
the LEFT arm in [DATE], and has possible palpable LEFT
axillary lymphadenopathy.

Family history of breast cancer in her mother at age 59.
EXAM:
DIGITAL DIAGNOSTIC LEFT MAMMOGRAM WITH CAD AND TOMO
ULTRASOUND LEFT BREAST

[Series 1: us breast*left* limited inc axilla · 0.06mm/px · 13 of 13 slices shown]
[im 1/13]
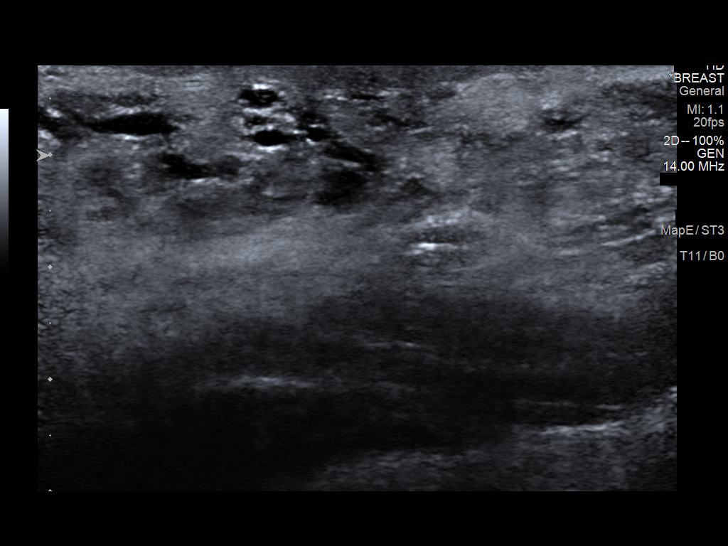
[im 2/13]
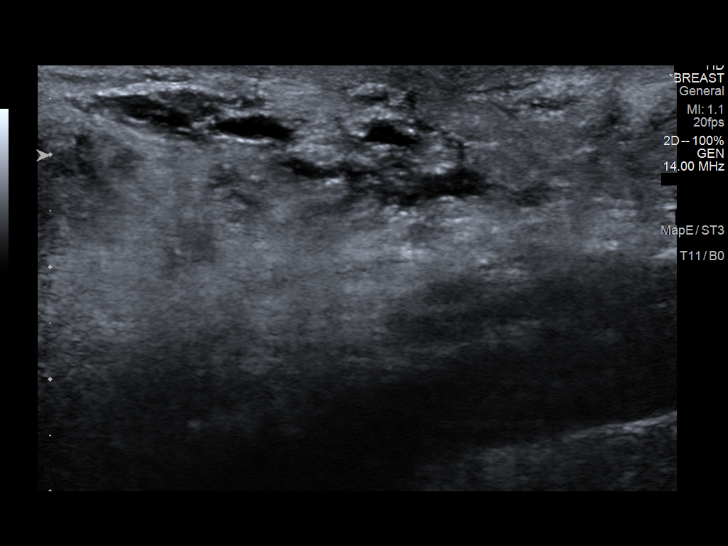
[im 3/13]
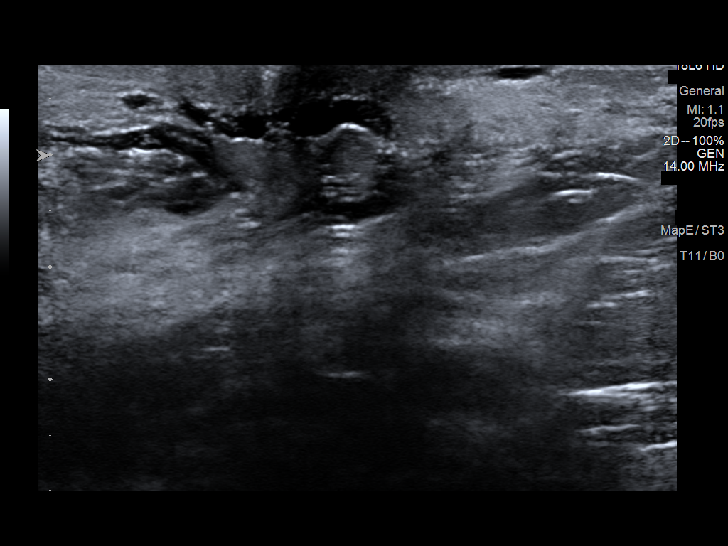
[im 4/13]
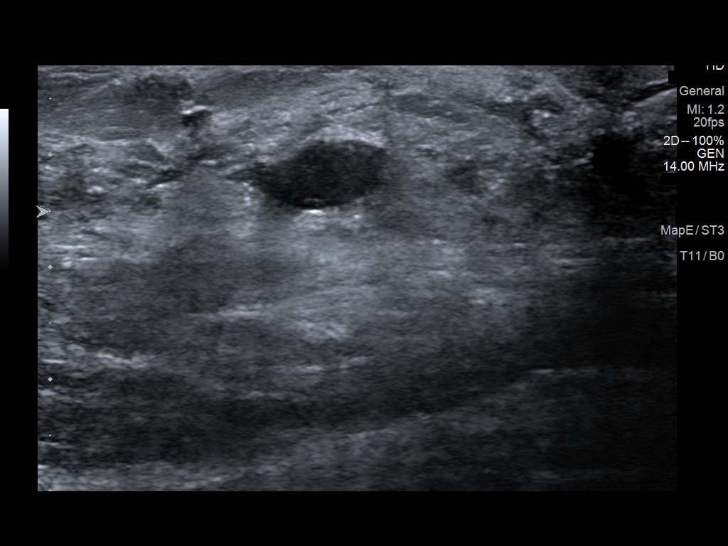
[im 5/13]
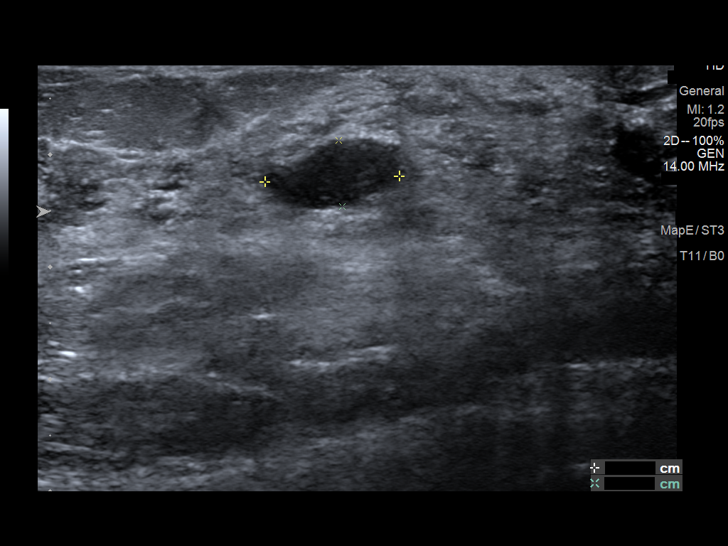
[im 6/13]
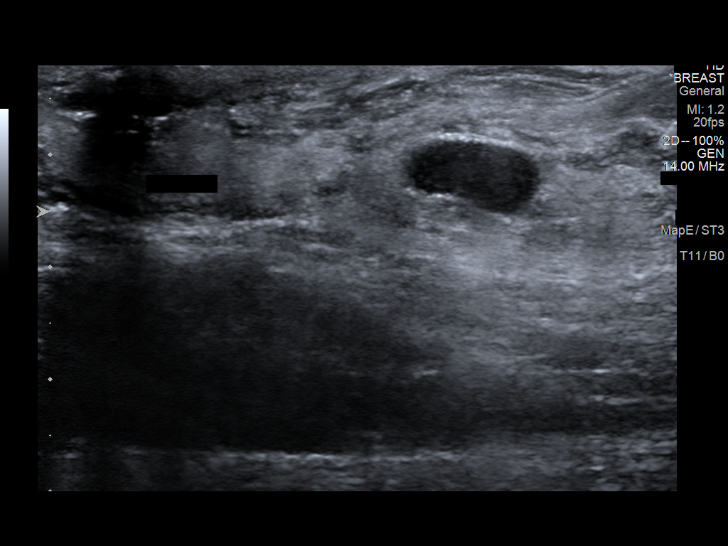
[im 7/13]
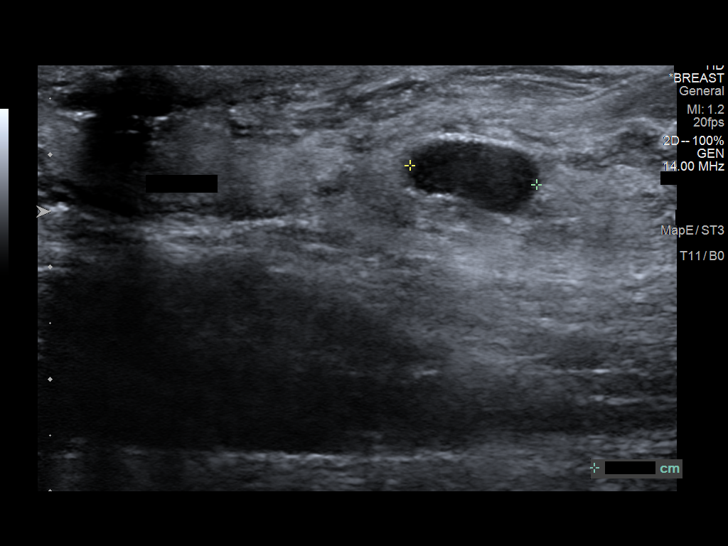
[im 8/13]
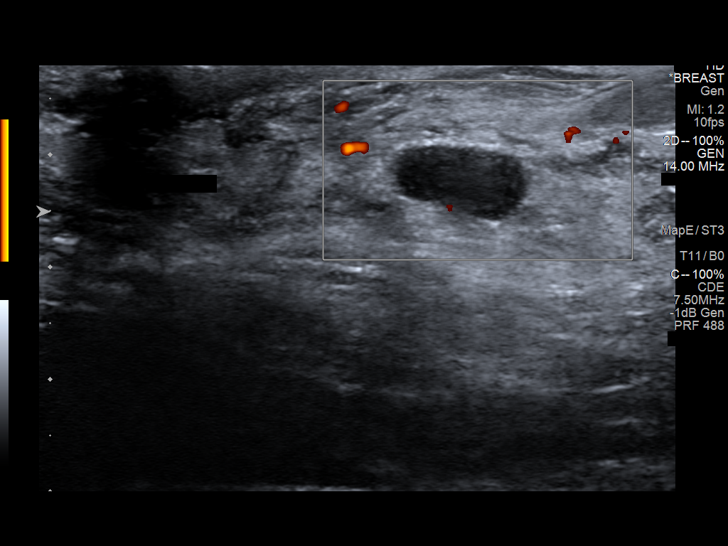
[im 9/13]
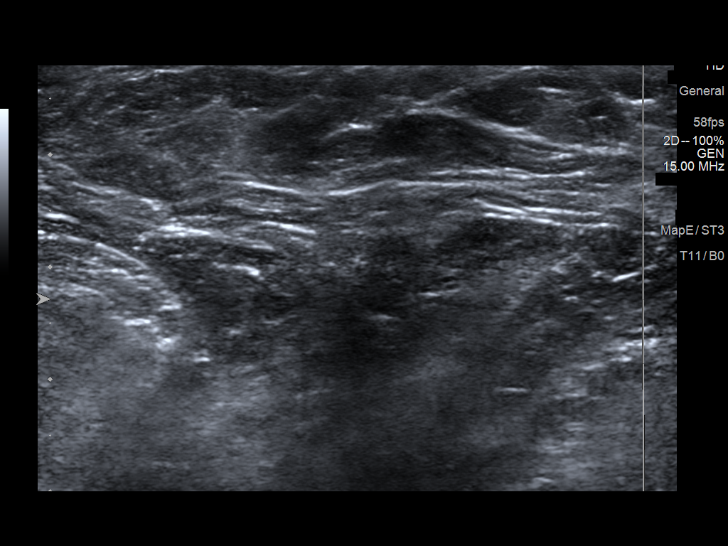
[im 10/13]
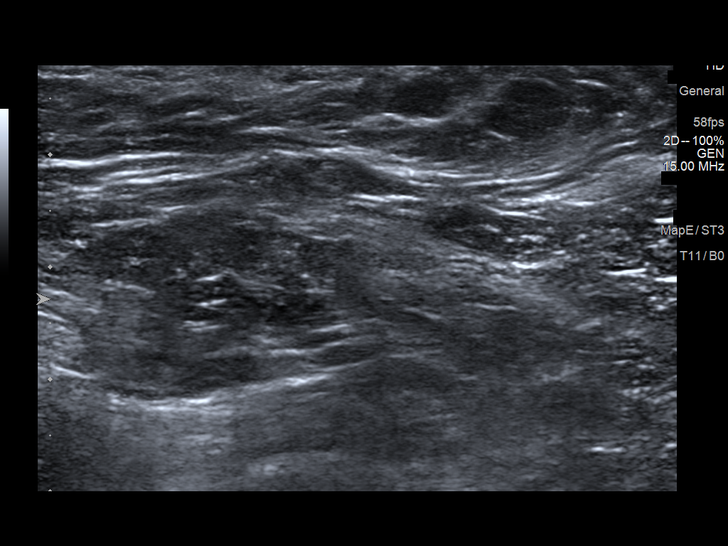
[im 11/13]
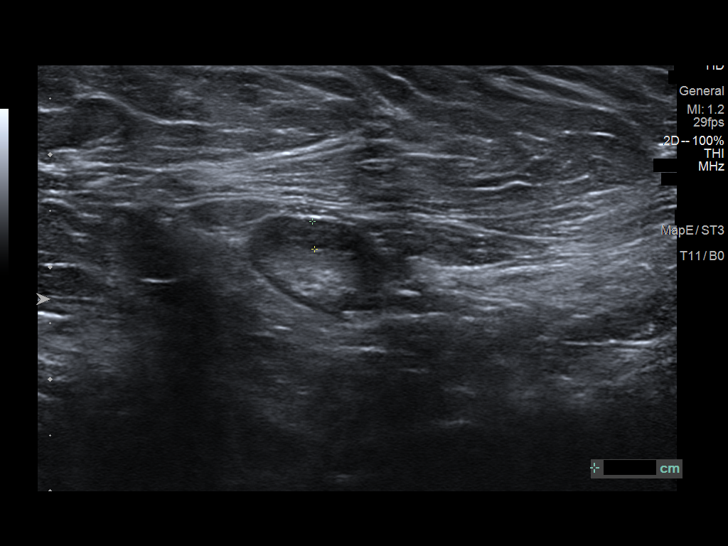
[im 12/13]
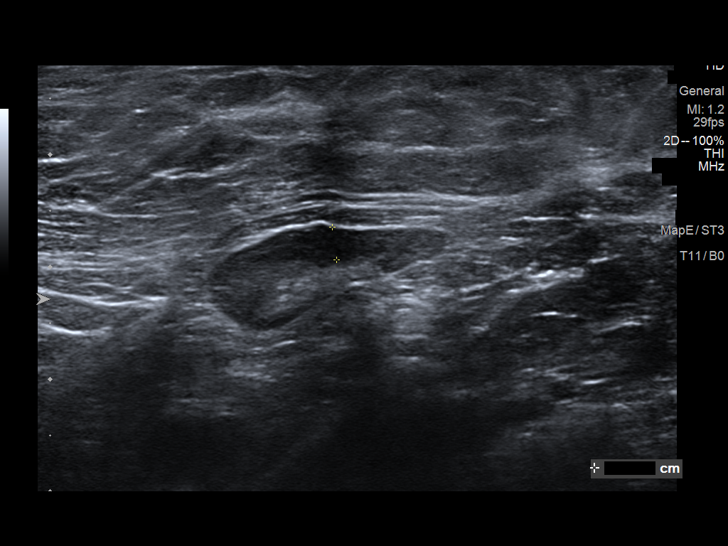
[im 13/13]
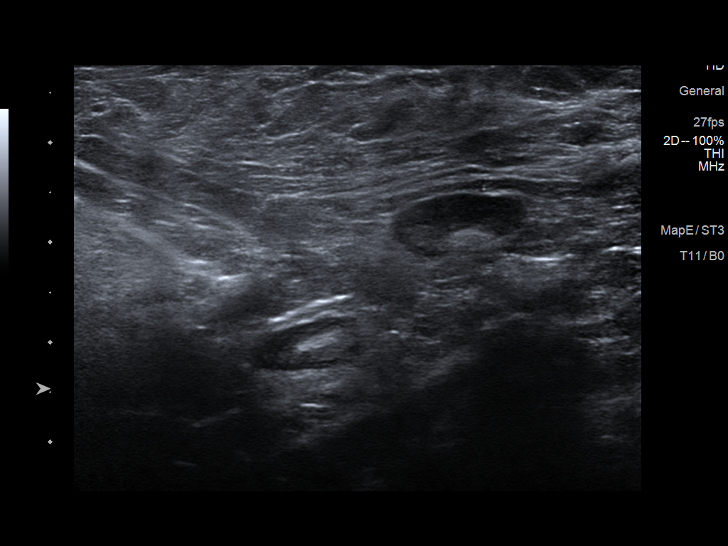

[13 of 13 positions shown; findings below may reference images not displayed]

ACR Breast Density Category d: The breast tissue is extremely dense,
which lowers the sensitivity of mammography.
FINDINGS: Tomosynthesis and synthesized digital 2D full field CC and MLO views
of the LEFT breast were obtained. Tomosynthesis and synthesized spot
compression tangential view of the retroareolar LEFT breast was also
obtained. Mammographic images were processed with CAD.

No mammographic abnormalities in the retroareolar LEFT breast to
explain the patient's nipple discharge. No findings suspicious for
malignancy in the LEFT breast. An upper normal sized lymph node in
the low LEFT axilla on the MLO images is unchanged dating back to
the [DQ] mammograms and is therefore benign.

Targeted ultrasound is performed, showing upper normal caliber ducts
in the subareolar location. At the 2 o'clock position subareolar
location approximately 1 cm from the nipple is an oval parallel
circumscribed hypoechoic mass measuring approximately 1.2 x 0.6 x
1.1 cm, demonstrating no posterior characteristics and demonstrating
peripheral power Doppler flow.

Sonographic evaluation of the LEFT axilla demonstrates no pathologic
lymphadenopathy.
IMPRESSION: 1. Indeterminate 1.2 cm mass in the 2 o'clock subareolar location of
the LEFT breast approximately 1 cm from the nipple. Papilloma is to
be excluded given the patient's history of a single duct clear
nipple discharge.
2. No pathologic LEFT axillary lymphadenopathy.

RECOMMENDATION:
Ultrasound-guided core needle biopsy of subareolar LEFT breast mass.

The ultrasound core needle biopsy procedure was discussed with the
patient and her questions were answered. She wishes to proceed and
the biopsy has been scheduled at her convenience.

I have discussed the findings and recommendations with the patient.

BI-RADS CATEGORY  4: Suspicious.

## 2020-01-04 IMAGING — MG MM DIGITAL DIAGNOSTIC UNILAT*L* W/ TOMO W/ CAD
6 series · 6 of 18 positions shown · non-contrast
Comparison: Previous exam(s).

CLINICAL DATA: 49-year-old presenting with a spontaneous clear LEFT
nipple discharge that occurs 2-3 nights per week over the past 7-8
months. The patient states that the discharge arises from a single
spot on the nipple. The patient had her initial [DQ] vaccine in
the LEFT arm in [DATE], and has possible palpable LEFT
axillary lymphadenopathy.

Family history of breast cancer in her mother at age 59.
EXAM:
DIGITAL DIAGNOSTIC LEFT MAMMOGRAM WITH CAD AND TOMO
ULTRASOUND LEFT BREAST

[L CC synth-2D]
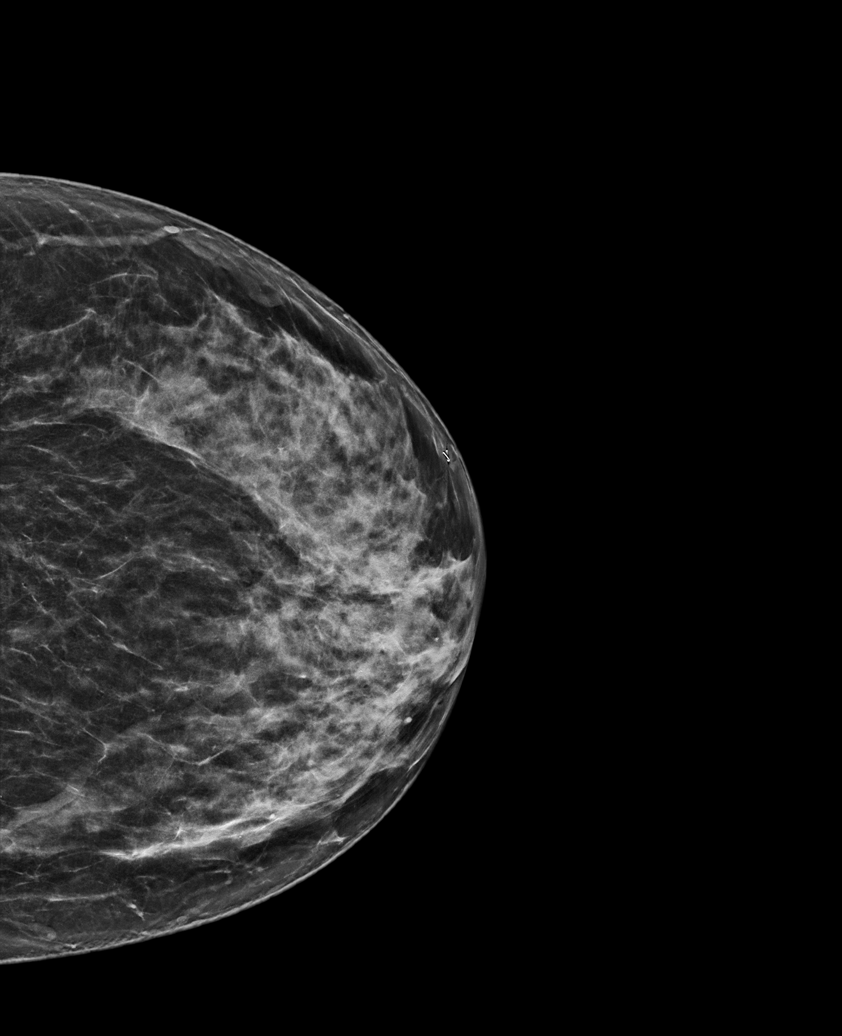

[L MLO synth-2D (1 of 2)]
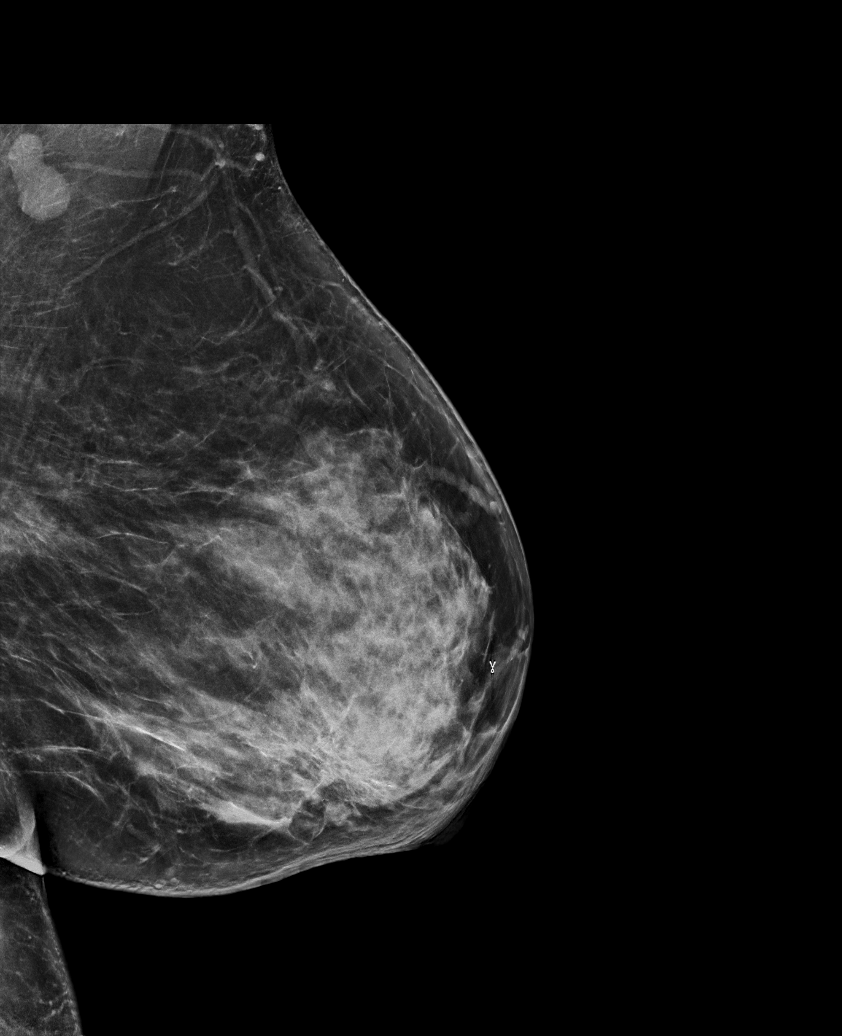

[L MLO synth-2D (2 of 2)]
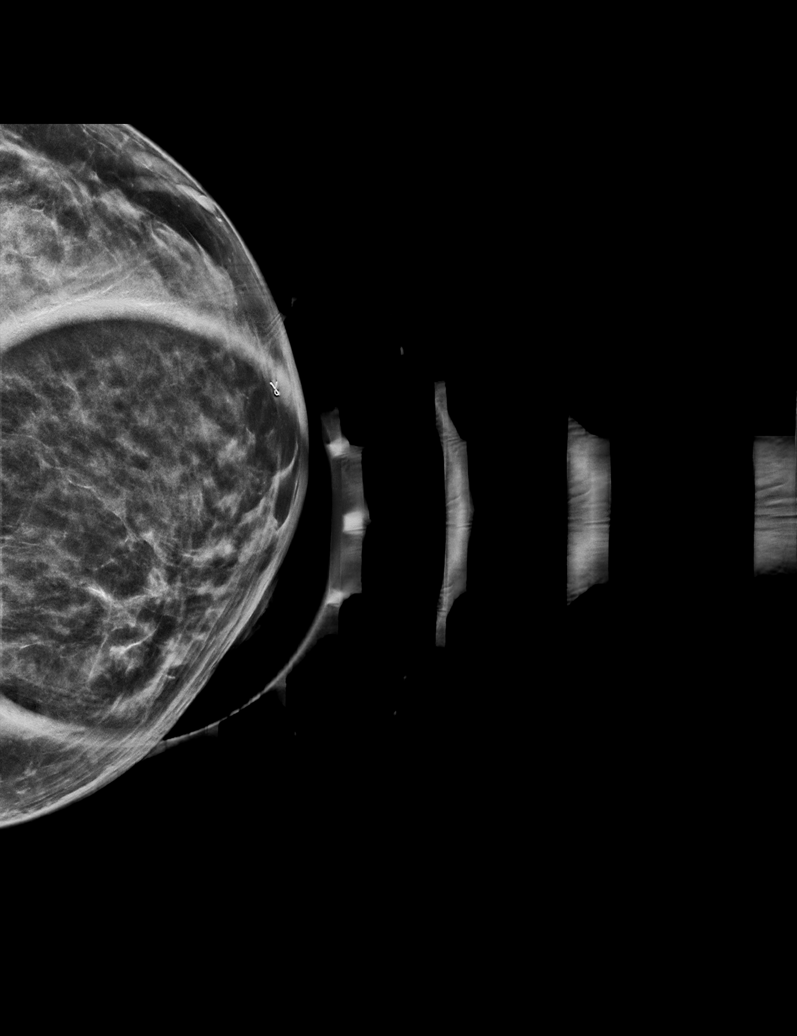

[L MLO tomo (1 of 2) · tomo slice 41/80.0]
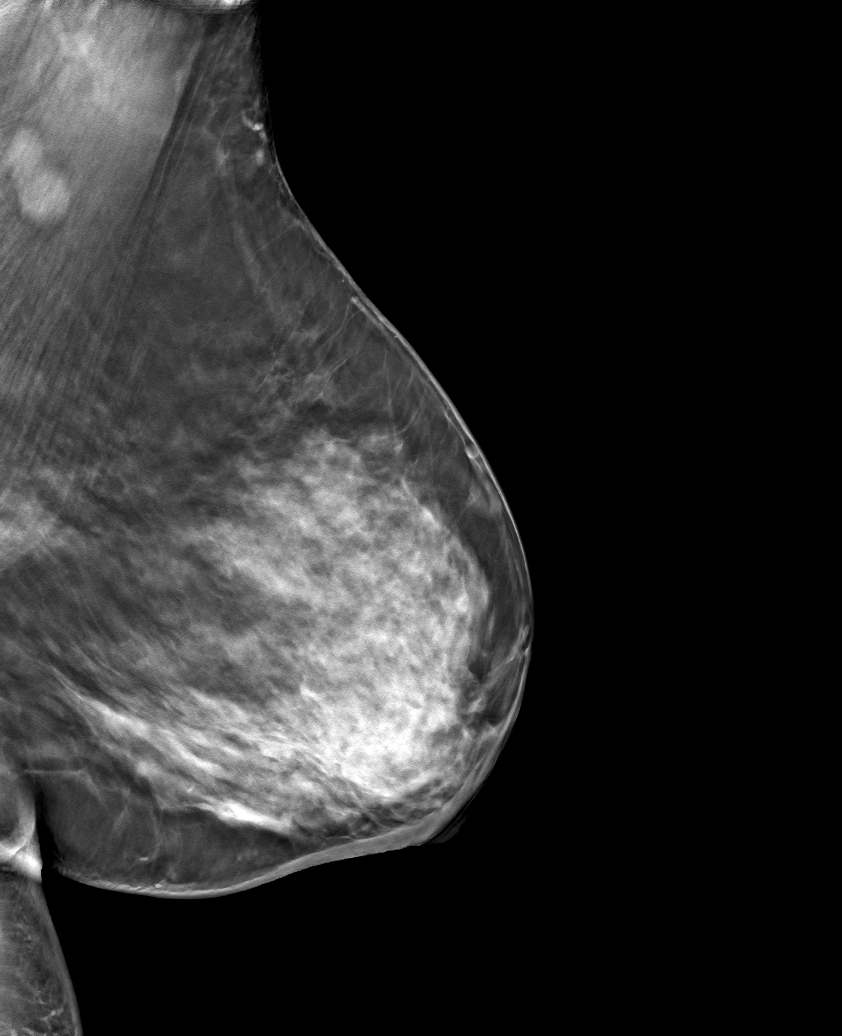

[L CC tomo · tomo slice 33/66.0]
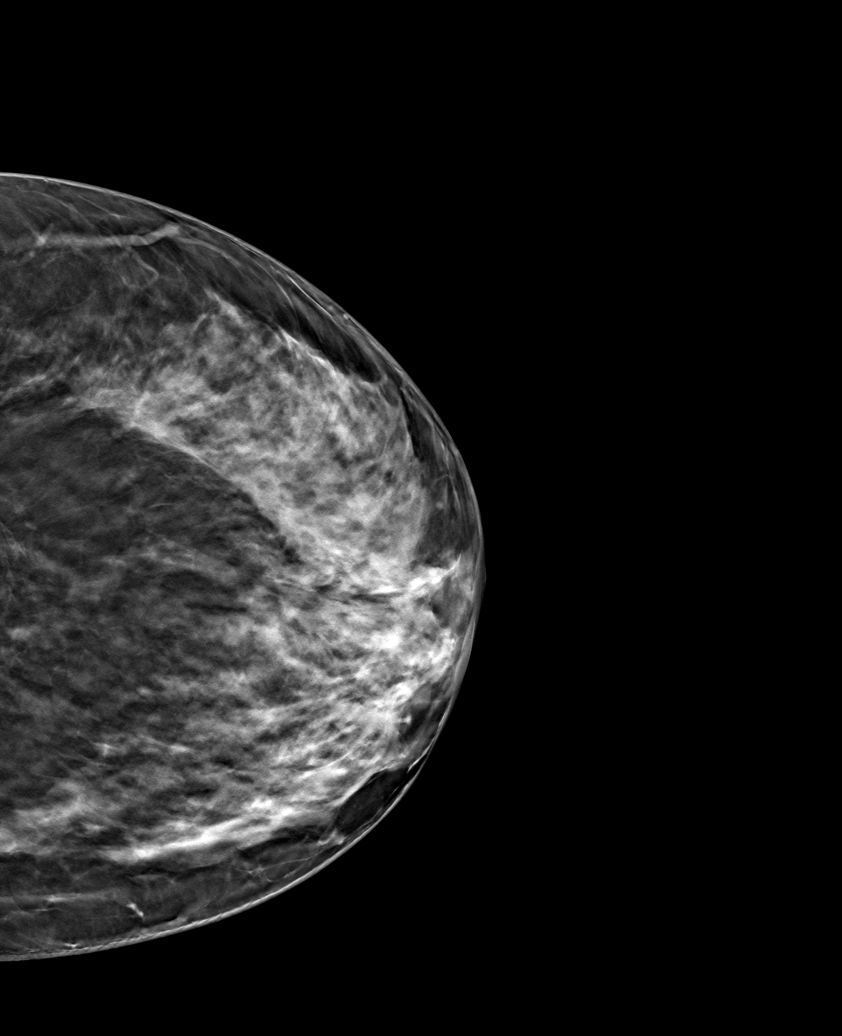

[L MLO tomo (2 of 2) · tomo slice 33/64.0]
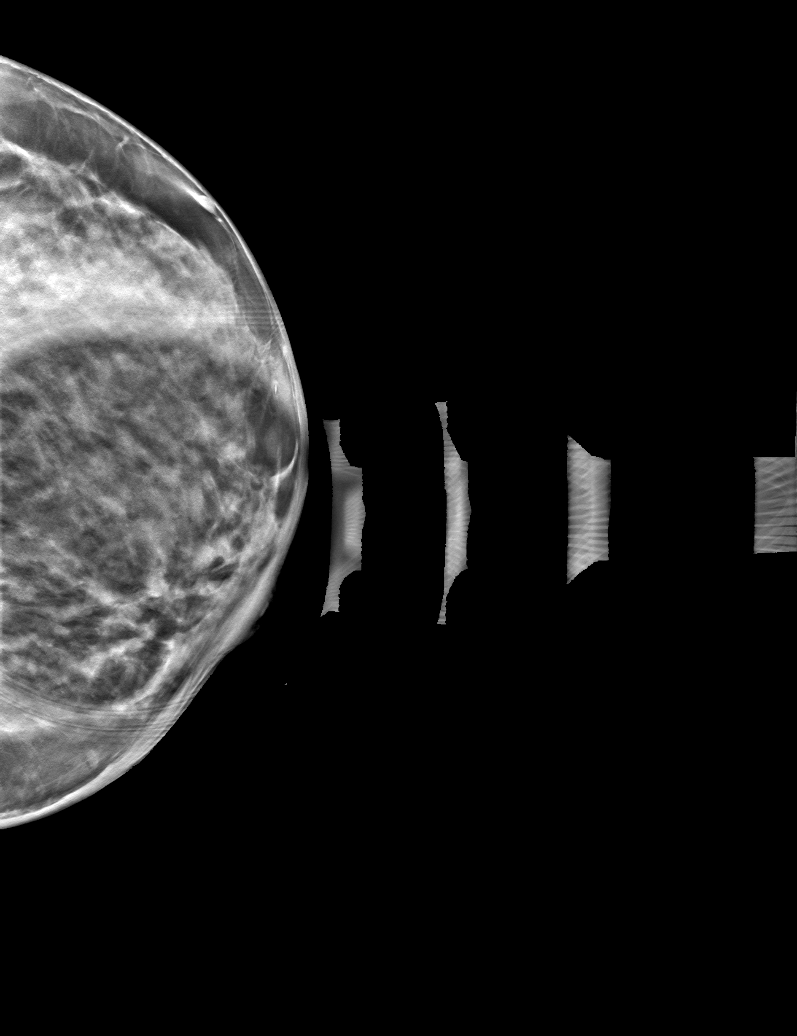

[6 of 18 positions shown; findings below may reference images not displayed]

ACR Breast Density Category d: The breast tissue is extremely dense,
which lowers the sensitivity of mammography.
FINDINGS: Tomosynthesis and synthesized digital 2D full field CC and MLO views
of the LEFT breast were obtained. Tomosynthesis and synthesized spot
compression tangential view of the retroareolar LEFT breast was also
obtained. Mammographic images were processed with CAD.

No mammographic abnormalities in the retroareolar LEFT breast to
explain the patient's nipple discharge. No findings suspicious for
malignancy in the LEFT breast. An upper normal sized lymph node in
the low LEFT axilla on the MLO images is unchanged dating back to
the [DQ] mammograms and is therefore benign.

Targeted ultrasound is performed, showing upper normal caliber ducts
in the subareolar location. At the 2 o'clock position subareolar
location approximately 1 cm from the nipple is an oval parallel
circumscribed hypoechoic mass measuring approximately 1.2 x 0.6 x
1.1 cm, demonstrating no posterior characteristics and demonstrating
peripheral power Doppler flow.

Sonographic evaluation of the LEFT axilla demonstrates no pathologic
lymphadenopathy.
IMPRESSION: 1. Indeterminate 1.2 cm mass in the 2 o'clock subareolar location of
the LEFT breast approximately 1 cm from the nipple. Papilloma is to
be excluded given the patient's history of a single duct clear
nipple discharge.
2. No pathologic LEFT axillary lymphadenopathy.

RECOMMENDATION:
Ultrasound-guided core needle biopsy of subareolar LEFT breast mass.

The ultrasound core needle biopsy procedure was discussed with the
patient and her questions were answered. She wishes to proceed and
the biopsy has been scheduled at her convenience.

I have discussed the findings and recommendations with the patient.

BI-RADS CATEGORY  4: Suspicious.

## 2020-01-04 NOTE — Progress Notes (Signed)
Called and discussed with patient, she is aware of mammogram results and has needle guided biopsy scheduled for 01/18/20 for the indeterminate mass in left breast. She also was already told that the left axillary lymphadenopathy was not pathologic. She denies other questions and concerns. I discussed that I will send to her PCP to keep Dr Tarry Kos updated.  Yehuda Savannah MD

## 2020-01-17 NOTE — Progress Notes (Signed)
    SUBJECTIVE:   CHIEF COMPLAINT / HPI: diabetes management   Diabetes Current Regimen: Trulicity, Jardiance and Metformin CBGs: 120-130s  Last A1c: 12.6 as of Oct 2021 Denies polyuria, polydipsia, hypoglycemia  Statin:lipitor 40 ACE/ARB: losartan   HTN  Patient reports her BP has been elevated as high as 151/100 on home monitor. Uses cuff for arm. Elevated measures in October. Some mornings would wake up with mild HA and some dizziness that she describes as mild, she also reports seeing dots. Reports still using sea salt on food. Has been working on decreasing salt usage. Reports drinking a lot of water. BP 148/82 today.   Breast Papilloma  Patient has appointment later today for biopsy of suspicious breast lesion found on her last mammogram. Patient confirmed. Still experieincing discharge. Pain is has subsided for last two weeks.   PERTINENT  PMH / PSH:  DM  HTN  nipple discharge   OBJECTIVE:   Ht 5' (1.524 m)   Wt 186 lb 6.4 oz (84.6 kg)   LMP 01/30/2019   BMI 36.40 kg/m    General: female appearing stated age in no acute distress HEENT: MMM Cardio: Normal S1 and S2, no S3 or S4. Rhythm is regular. No murmurs or rubs.  Bilateral radial pulses palpable Pulm: Clear to auscultation bilaterally, no crackles, wheezing, or diminished breath sounds. Normal respiratory effort Abdomen: Bowel sounds normal. Abdomen soft and non-tender.  Extremities:Trace LE peripheral edema. Warm/ well perfused.  Neuro: pt alert and oriented x4   ASSESSMENT/PLAN:   Discharge from breast Patient following up for biopsy on 11/30 after this appt  Results to follow   Primary hypertension Continue losartan  Added chlorthalidone today  Patient to follow up for blood pressure check  Will need labs in next 3 months to check for any electrolyte changes  Type 2 diabetes mellitus without complication, without long-term current use of insulin ([place]) Patient to continue trulicity, jardiance and  metformin with regular glucose checks at home     F/u in 4 weeks for BP check   Eulis Foster, MD [place]

## 2020-01-18 ENCOUNTER — Ambulatory Visit
Admission: RE | Admit: 2020-01-18 | Discharge: 2020-01-18 | Disposition: A | Discharge status: Home / Self Care | Payer: 59 | Source: Ambulatory Visit | Attending: Family Medicine | Admitting: Family Medicine

## 2020-01-18 ENCOUNTER — Ambulatory Visit (INDEPENDENT_AMBULATORY_CARE_PROVIDER_SITE_OTHER): Payer: 59 | Admitting: Family Medicine

## 2020-01-18 ENCOUNTER — Encounter: Payer: Self-pay | Admitting: Family Medicine

## 2020-01-18 ENCOUNTER — Other Ambulatory Visit: Payer: Self-pay

## 2020-01-18 DIAGNOSIS — E119 Type 2 diabetes mellitus without complications: Secondary | ICD-10-CM | POA: Diagnosis not present

## 2020-01-18 DIAGNOSIS — N6452 Nipple discharge: Secondary | ICD-10-CM

## 2020-01-18 DIAGNOSIS — I10 Essential (primary) hypertension: Secondary | ICD-10-CM | POA: Diagnosis not present

## 2020-01-18 IMAGING — MG MM BREAST LOCALIZATION CLIP
4 series · 4 of 12 positions shown · non-contrast
Comparison: Previous exam(s).

CLINICAL DATA: Biopsy of a 2 o'clock left breast mass

EXAM:
DIAGNOSTIC LEFT MAMMOGRAM POST ULTRASOUND BIOPSY

[L ML synth-2D]
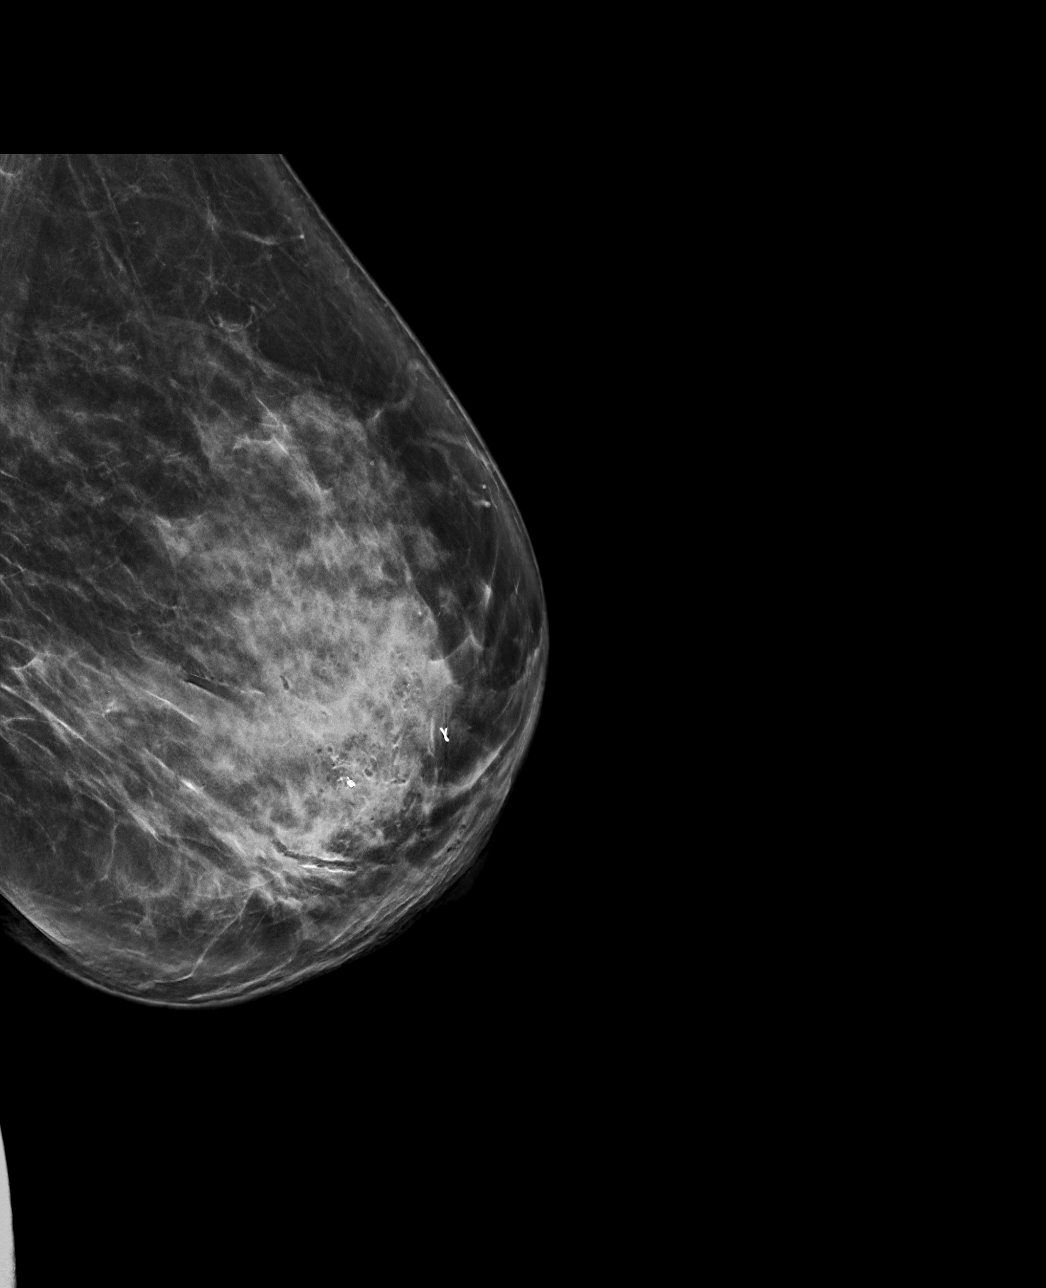

[L CC synth-2D]
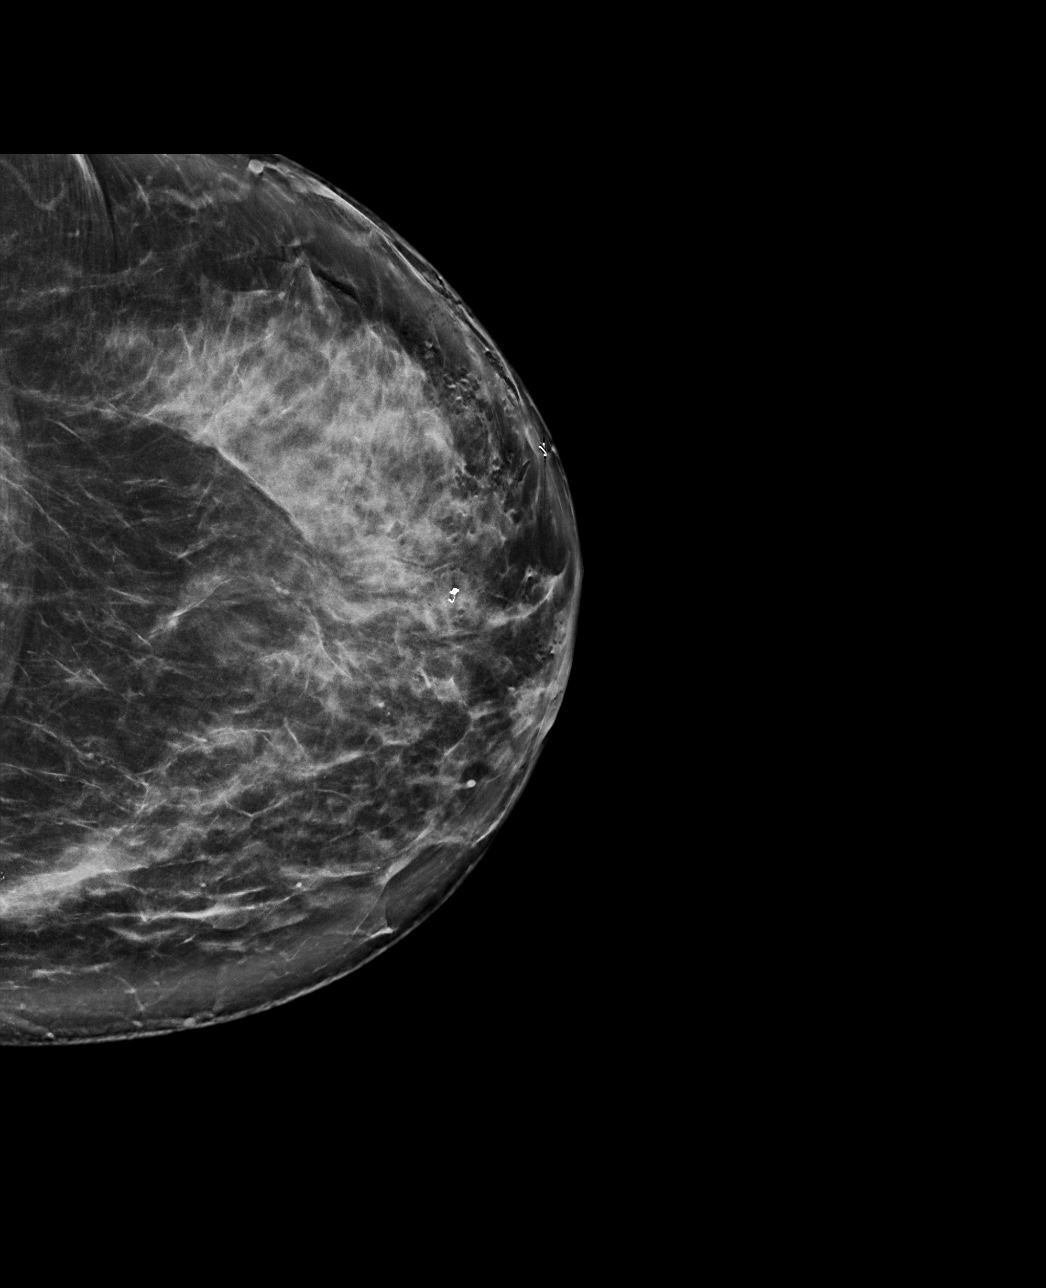

[L ML tomo · tomo slice 49/98.0]
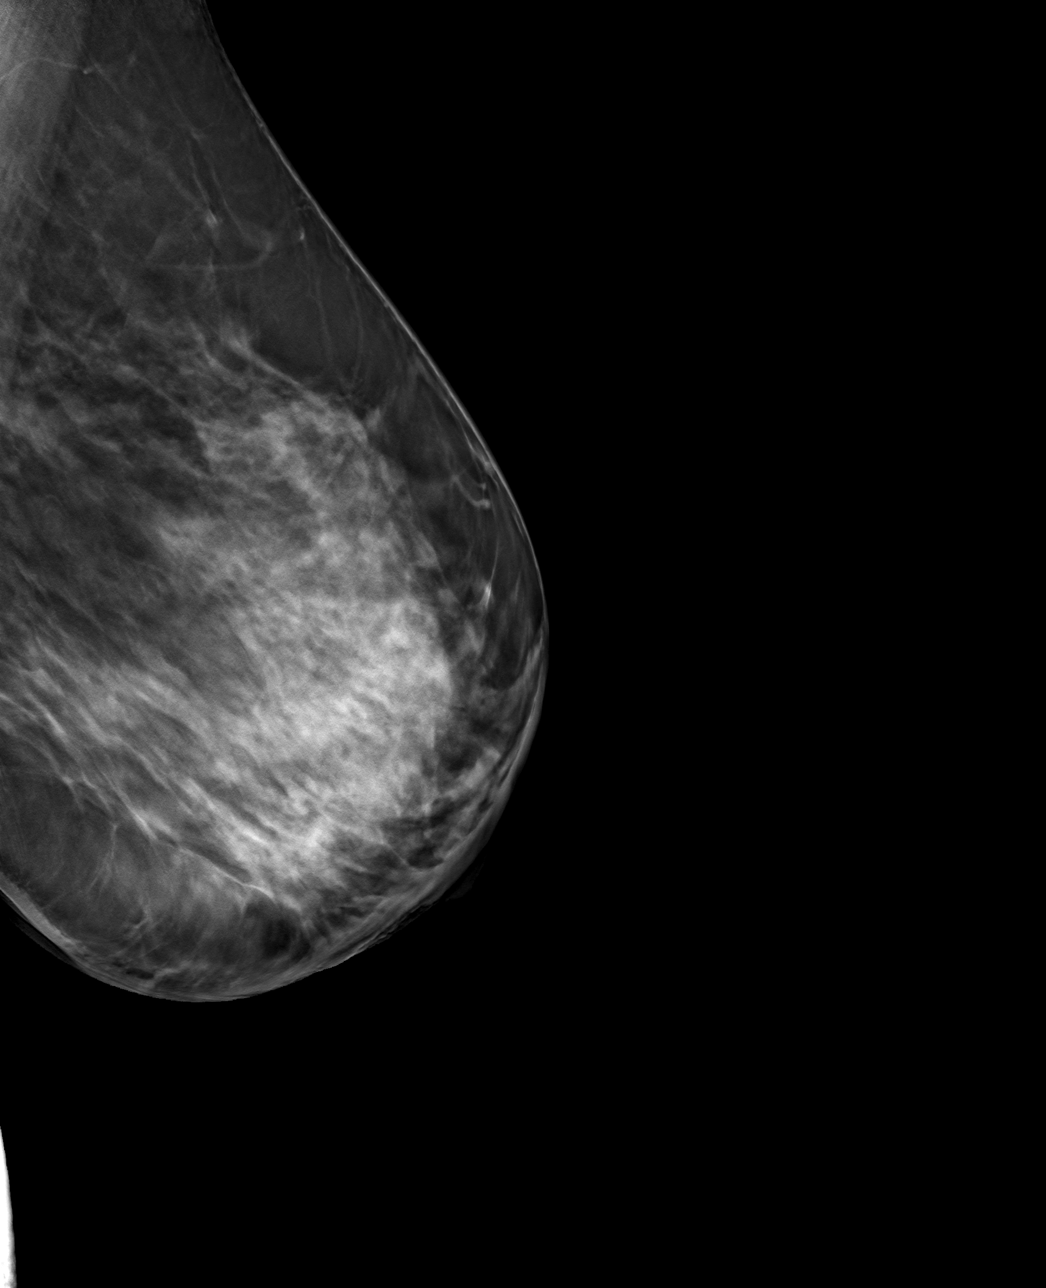

[L CC tomo · tomo slice 49/98.0]
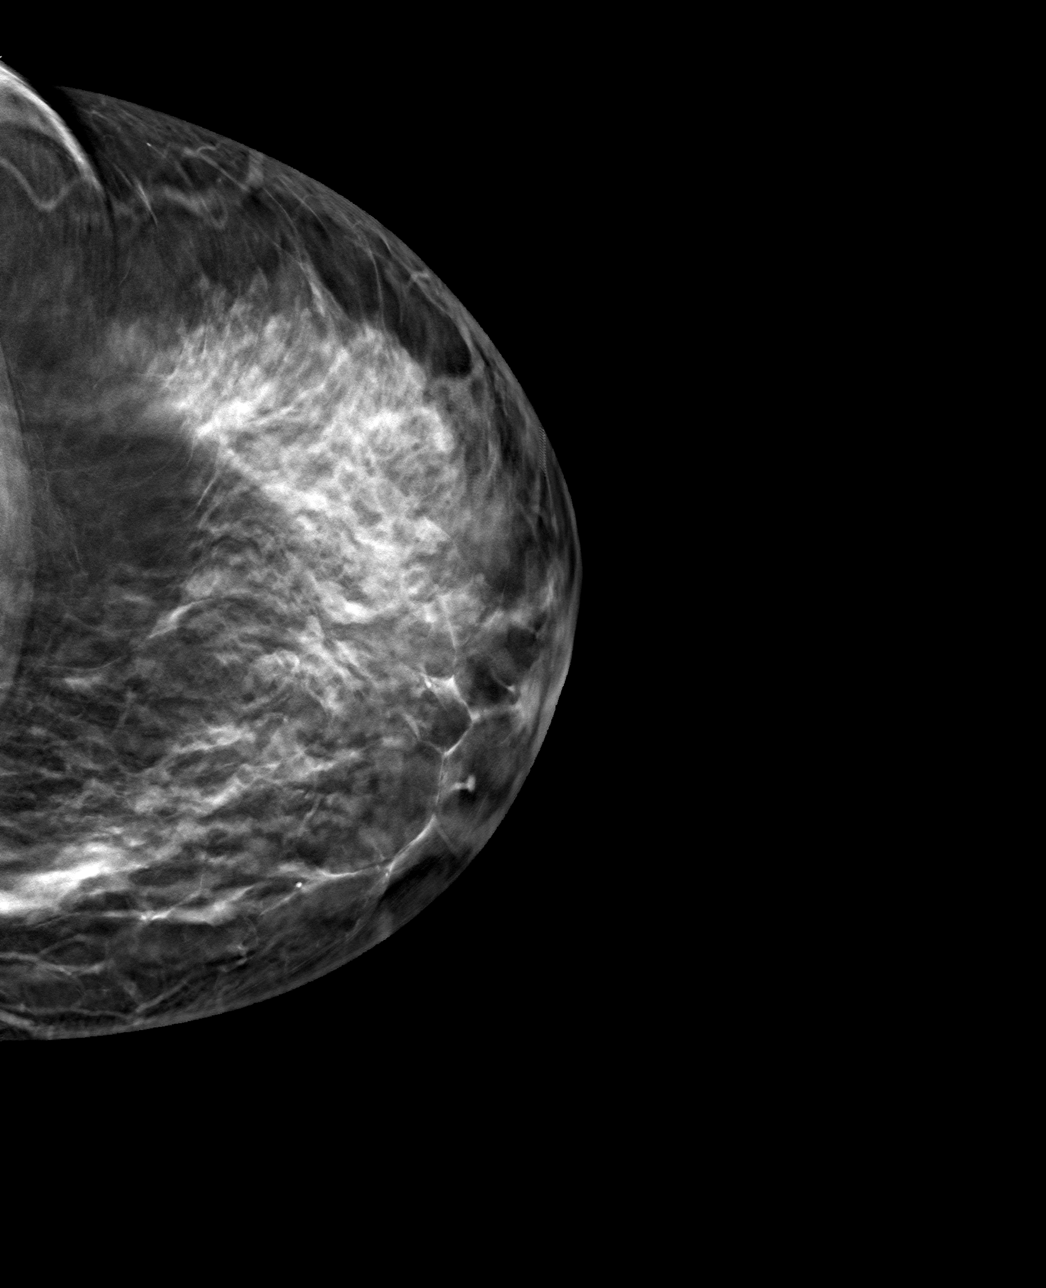

[4 of 12 positions shown; findings below may reference images not displayed]

FINDINGS: Mammographic images were obtained following ultrasound guided biopsy
of a left breast mass at 2 o'clock. The biopsy marking clip is in
expected position at the site of biopsy.
IMPRESSION: Appropriate positioning of the coil shaped biopsy marking clip at
the site of biopsy in the region of the biopsied left breast mass.

Final Assessment: Post Procedure Mammograms for Marker Placement

## 2020-01-18 IMAGING — US US BREAST BX W LOC DEV 1ST LESION IMG BX SPEC US GUIDE*L*
1 series · 12 of 12 positions shown · non-contrast
Comparison: Previous exam(s).
COMPARISON: Previous exam(s).

Addendum:
CLINICAL DATA: Biopsy of the 2 o'clock retroareolar left breast

EXAM:
ULTRASOUND GUIDED LEFT BREAST CORE NEEDLE BIOPSY

[Series 1: us breast bx w loc dev 1st lesion img bx spec us g · 0.07mm/px · 12 of 12 slices shown]
[im 1/12]
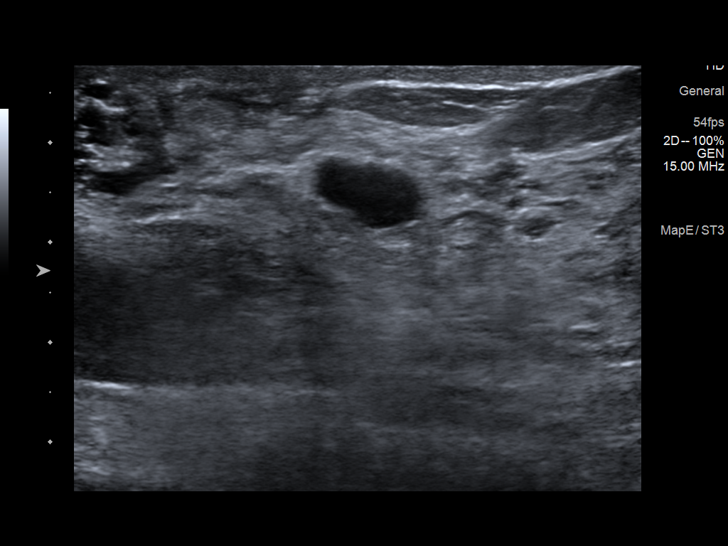
[im 2/12]
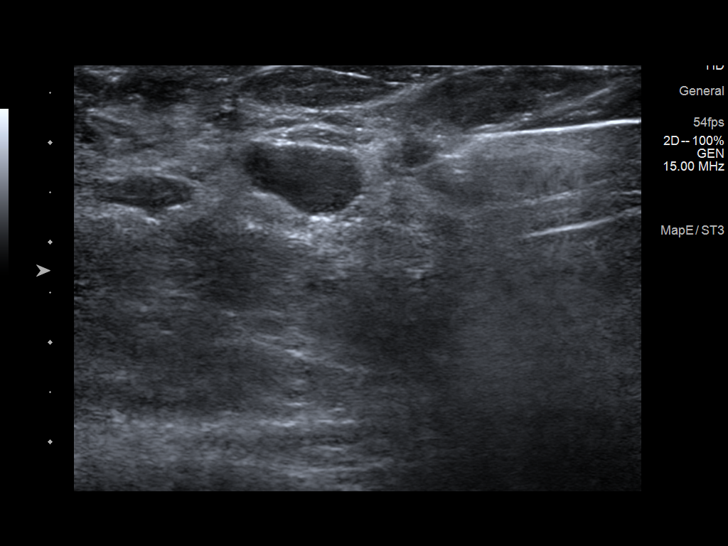
[im 3/12]
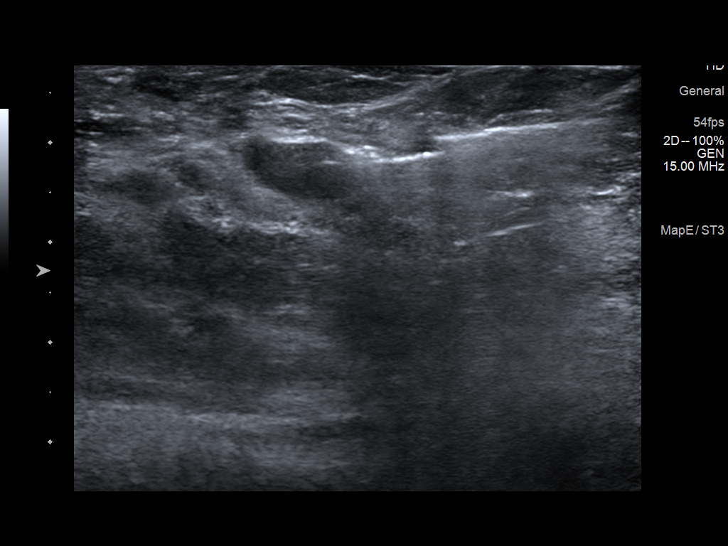
[im 4/12]
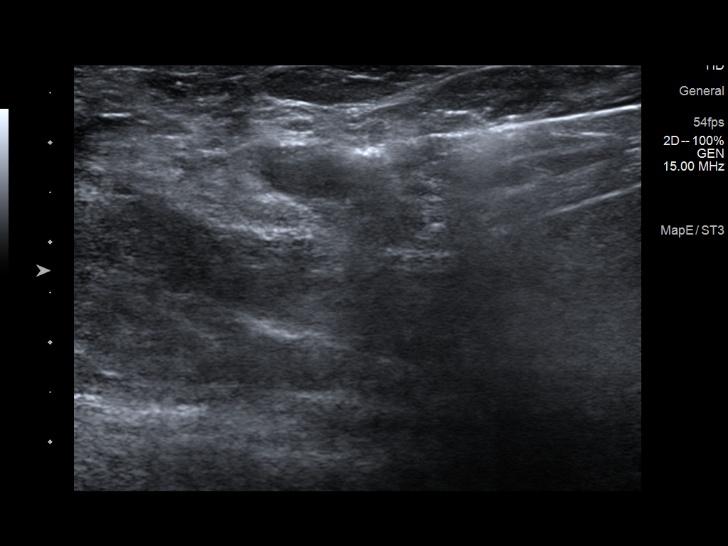
[im 5/12]
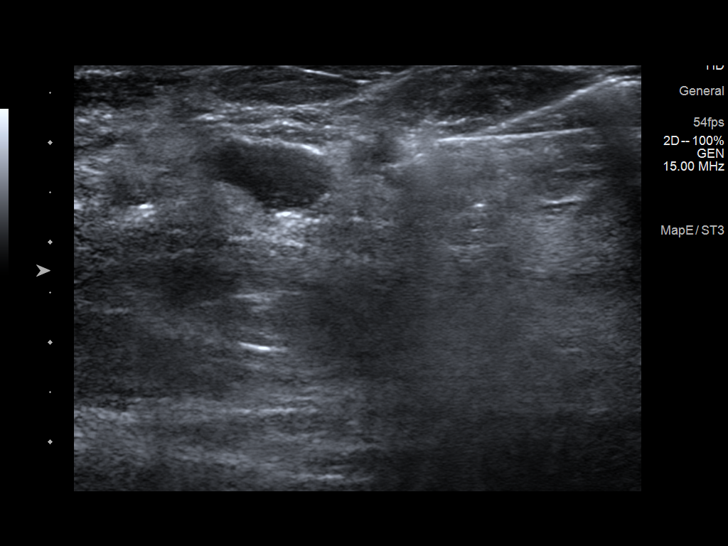
[im 6/12]
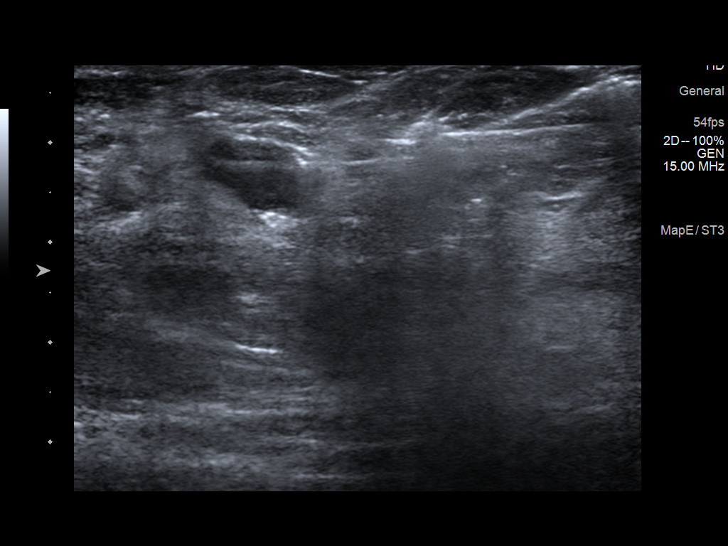
[im 7/12]
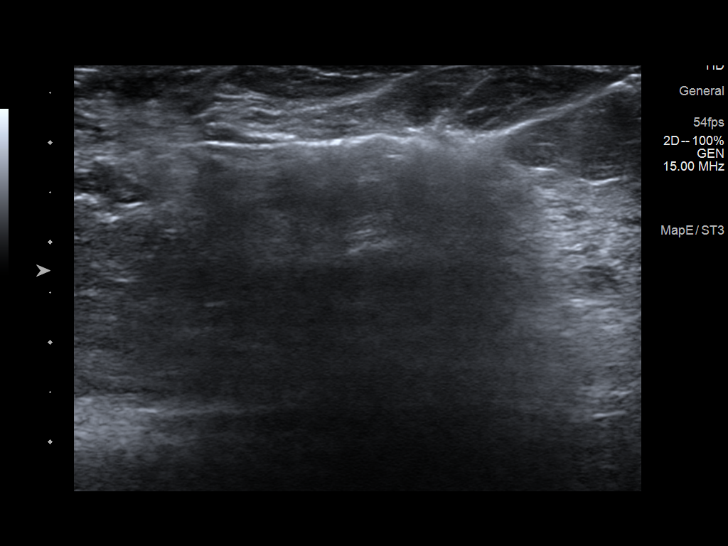
[im 8/12]
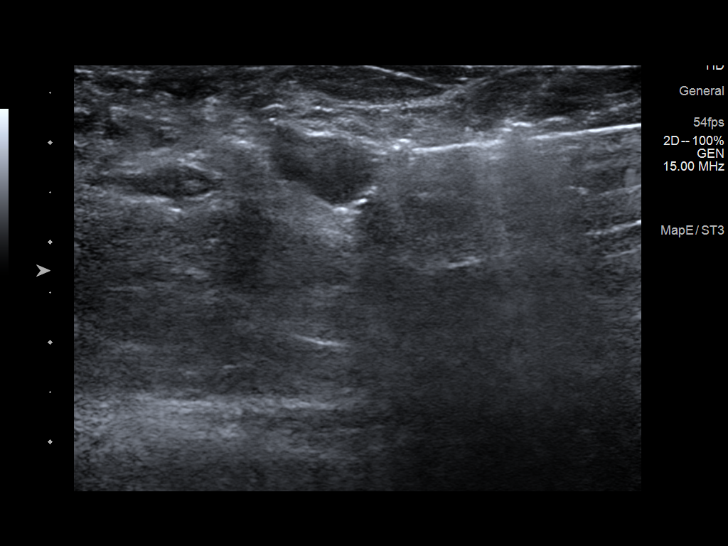
[im 9/12]
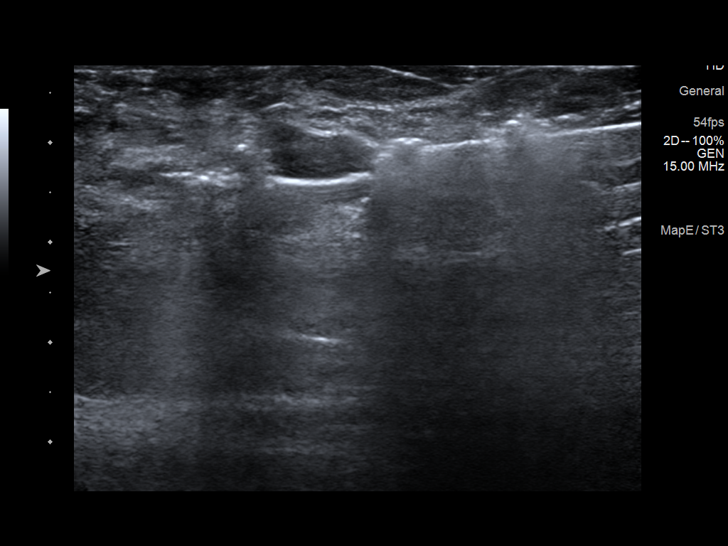
[im 10/12]
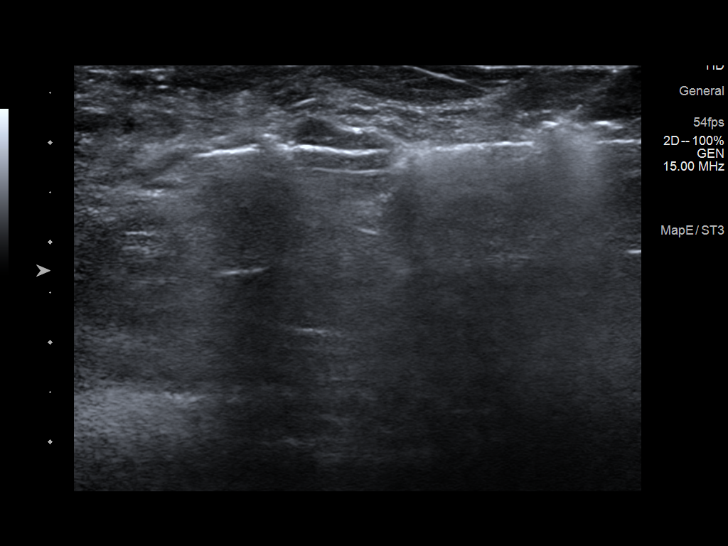
[im 11/12]
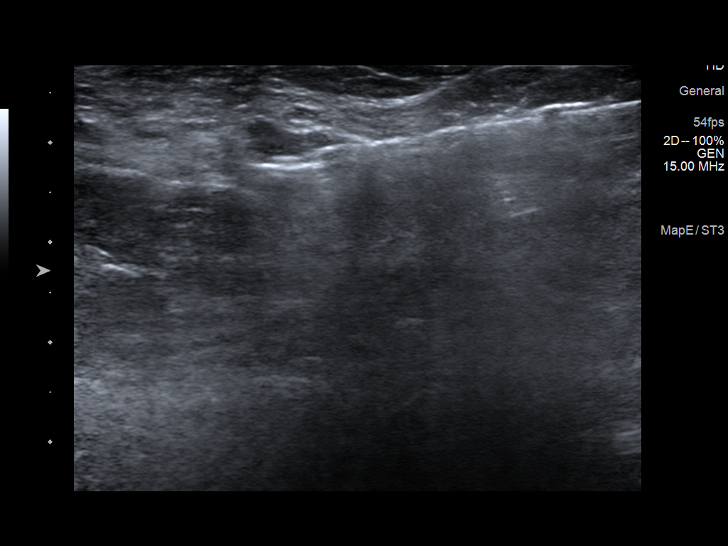
[im 12/12]
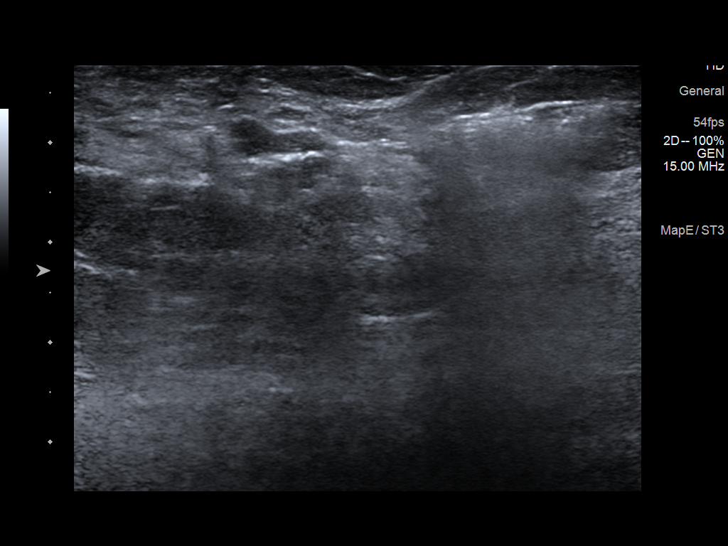

[12 of 12 positions shown; findings below may reference images not displayed]



Lesion quadrant: 2 o'clock retroareolar

Using sterile technique and 1% Lidocaine as local anesthetic, under
direct ultrasound visualization, a 12 gauge EMELY device was
used to perform biopsy of a left 2 o'clock retroareolar mass using a
lateral approach. At the conclusion of the procedure a coil shaped
tissue marker clip was deployed into the biopsy cavity. Follow up 2
view mammogram was performed and dictated separately.
IMPRESSION: Ultrasound guided biopsy of a 2 o'clock left breast mass. No
apparent complications.

ADDENDUM:
Pathology revealed MYOEPITHELIOMA. NO EVIDENCE OF MALIGNANCY of the
LEFT breast, 2:00 o'clock. This was found to be concordant by Dr.
EMELY.

Pathology results were discussed with the patient by telephone. The
patient reported doing well after the biopsy with tenderness at the
site. Post biopsy instructions and care were reviewed and questions
were answered. The patient was encouraged to call The [REDACTED]

Recommend breast MRI (with and without contrast) to work up nipple
discharge.

I spoke to the pathologist regarding the pathology of a
myeloepithelioma. This is a very rare lesion. The pathologist
assured me there are no signs of malignancy. However, given the rare
nature of this mass, it was decided to follow the biopsied mass in
the left breast at 2 o'clock in 6 months with ultrasound to ensure
stability. However, if the patient goes to surgery for the left
nipple discharge, I would recommend localizing and removing the
myeloepithelioma at that time since it is in close proximity to the
retroareolar region.

Pathology results reported by EMELY RN on [DATE].



Lesion quadrant: 2 o'clock retroareolar

Using sterile technique and 1% Lidocaine as local anesthetic, under
direct ultrasound visualization, a 12 gauge EMELY device was
used to perform biopsy of a left 2 o'clock retroareolar mass using a
lateral approach. At the conclusion of the procedure a coil shaped
tissue marker clip was deployed into the biopsy cavity. Follow up 2
view mammogram was performed and dictated separately.
IMPRESSION: Ultrasound guided biopsy of a 2 o'clock left breast mass. No
apparent complications.

## 2020-01-18 MED ORDER — CHLORTHALIDONE 25 MG PO TABS: 25.0000 mg | ORAL_TABLET | Freq: Every day | ORAL | 0 refills | Status: DC

## 2020-01-18 NOTE — Patient Instructions (Signed)
Thank you for choosing [facility] for your care today.  For your diabetes, please continue to use your Trulicity, Jardiance and Metformin as prescribed.  Please also continue to monitor your diet and check your blood glucoses regularly.  I believe that your blood glucose numbers have improved from before and with added diet and exercise changes, your numbers will continue to decrease.  For your blood pressure, I have prescribed chlorthalidone 25 mg to take daily.  This will work as a diuretic to increase your urine output.  Please follow-up as scheduled for your biopsy of your breast.  Your primary care doctor will be notified of the results as they are available.  Please return to our clinic in 4 weeks for follow-up for your blood pressure management.

## 2020-01-20 NOTE — Assessment & Plan Note (Signed)
Continue losartan  Added chlorthalidone today  Patient to follow up for blood pressure check  Will need labs in next 3 months to check for any electrolyte changes

## 2020-01-20 NOTE — Assessment & Plan Note (Signed)
Patient to continue trulicity, jardiance and metformin with regular glucose checks at home

## 2020-01-20 NOTE — Assessment & Plan Note (Signed)
Patient following up for biopsy on 11/30 after this appt  Results to follow

## 2020-01-25 ENCOUNTER — Other Ambulatory Visit: Payer: Self-pay | Admitting: Family Medicine

## 2020-01-25 DIAGNOSIS — D369 Benign neoplasm, unspecified site: Secondary | ICD-10-CM

## 2020-01-25 DIAGNOSIS — N6452 Nipple discharge: Secondary | ICD-10-CM

## 2020-02-21 ENCOUNTER — Ambulatory Visit: Payer: 59 | Admitting: Family Medicine

## 2020-02-21 ENCOUNTER — Encounter: Payer: Self-pay | Admitting: Family Medicine

## 2020-02-21 NOTE — Progress Notes (Deleted)
   Subjective:   Patient ID: Valerie Bauer    DOB: Jan 31, 1971, 50 y.o. female   MRN: [id number]  [name] is a 50 y.o. 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 Currently on Jardiance 25mg , Metformin 1000mg  QD, trulicity 0.75 qweekly. Endorses compliance. Notes CBGs range ***. Denies any hypoglycemia. Denies any polyuria, polydipsia, polyphagia. Due for diabetic eye exam.  Lab Results  Component Value Date   HGBA1C 12.6 (A) 11/30/2019   HGBA1C 6.5 12/25/2018   HGBA1C 10.5 (H) 09/20/2018    HTN:    today. Currently on chlorthalidone 25mg  QD, Losartan 50mg  QD. Endorses compliance. Non-smoker/Current everyday smoker***. Denies any chest pain, SOB, vision changes, or headaches.   Hypothyroidism: Last TSH 11.2 on 11/30/19. Currently on Synthroid 11/20/2018 QD. Denies any symptoms. Endorses compliance. No side effects.    Review of Systems:  Per HPI.   Objective:   LMP 01/30/2019  Vitals and nursing note reviewed.  General: pleasant ***, sitting comfortably in exam chair, well nourished, well developed, in no acute distress with non-toxic appearance HEENT: normocephalic, atraumatic, moist mucous membranes, oropharynx clear without erythema or exudate, TM normal bilaterally  Neck: supple, non-tender without lymphadenopathy CV: regular rate and rhythm without murmurs, rubs, or gallops, no lower extremity edema, 2+ radial and pedal pulses bilaterally Lungs: clear to auscultation bilaterally with normal work of breathing on room air Resp: breathing comfortably on room air, speaking in full sentences Abdomen: soft, non-tender, non-distended, no masses or organomegaly palpable, normoactive bowel sounds Skin: warm, dry, no rashes or lesions Extremities: warm and well perfused, normal tone MSK: ROM grossly intact, strength intact,  gait normal Neuro: Alert and oriented, speech normal  Assessment & Plan:   No problem-specific Assessment & Plan notes found for this encounter.  No orders of the defined types were placed in this encounter.  No orders of the defined types were placed in this encounter.   , DO PGY-3, [facility] 02/21/2020 8:04 AM

## 2020-02-22 ENCOUNTER — Other Ambulatory Visit: Payer: Self-pay | Admitting: Family Medicine

## 2020-02-22 MED ORDER — CHLORTHALIDONE 25 MG PO TABS
25.0000 mg | ORAL_TABLET | Freq: Every day | ORAL | 0 refills | Status: DC
Start: 2020-02-22 — End: 2020-09-04

## 2020-02-25 ENCOUNTER — Ambulatory Visit
Admission: RE | Admit: 2020-02-25 | Discharge: 2020-02-25 | Disposition: A | Discharge status: Home / Self Care | Payer: 59 | Source: Ambulatory Visit | Attending: Family Medicine | Admitting: Family Medicine

## 2020-02-25 ENCOUNTER — Ambulatory Visit: Payer: Self-pay | Admitting: Surgery

## 2020-02-25 ENCOUNTER — Other Ambulatory Visit: Payer: Self-pay

## 2020-02-25 DIAGNOSIS — N6452 Nipple discharge: Secondary | ICD-10-CM

## 2020-02-25 DIAGNOSIS — N6320 Unspecified lump in the left breast, unspecified quadrant: Secondary | ICD-10-CM

## 2020-02-25 IMAGING — MR MR BREAST BILAT WO/W CM
8 of 12 series · 29 of 48 positions shown · IV contrast (8ml gadavist)
Comparison: Prior mammograms and ultrasounds

CLINICAL DATA: 49-year-old female with spontaneous clear LEFT
nipple discharge for 1 year. UPPER-OUTER RETROAREOLAR LEFT breast
mass biopsy on [DATE] demonstrating a myoepithelioma.

LABS:  None performed today
EXAM:
BILATERAL BREAST MRI WITH AND WITHOUT CONTRAST
TECHNIQUE: Multiplanar, multisequence MR images of both breasts were obtained
prior to and following the intravenous administration of 8 ml of
Gadavist

[Series 2: t2_tirm_tra ipat (a-p) · axial · 3.0mm · 0.78mm/px · 1 of 70 slices shown]
[im 1/70]
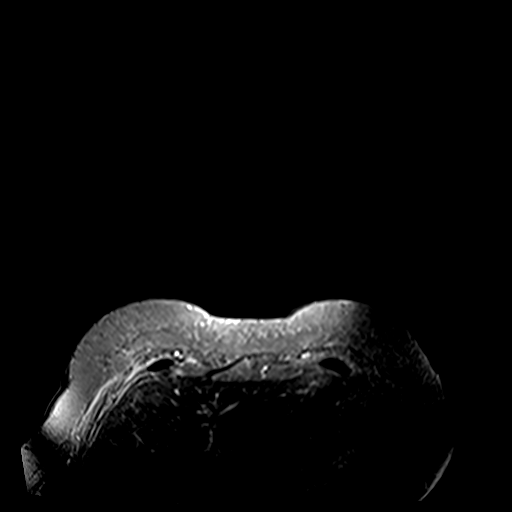

[Series 3: fl3d pre-cm no · axial · non-contrast · 0.9mm · 1.04mm/px · z∈[-69,+146]mm · 5 of 230 slices shown]
[im 1/230]
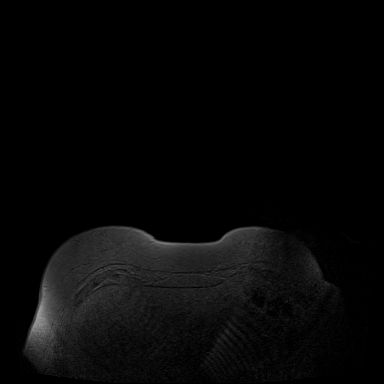
[im 58/230]
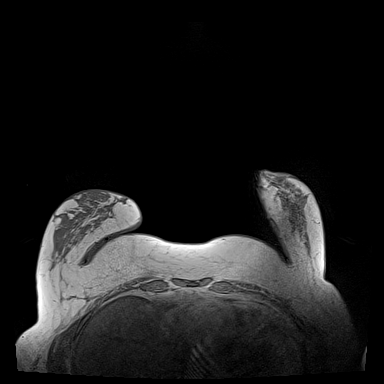
[im 115/230]
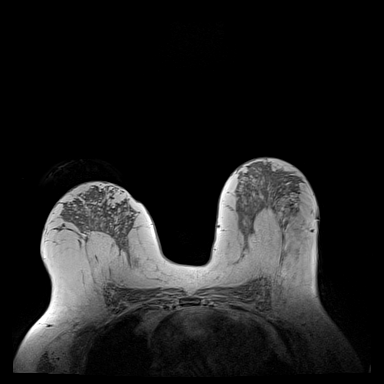
[im 172/230]
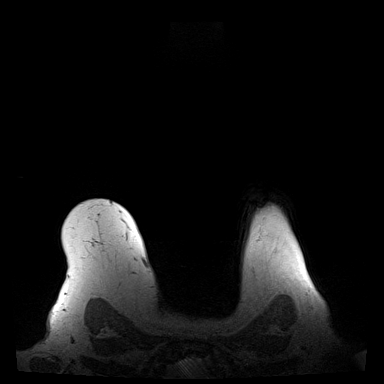
[im 230/230]
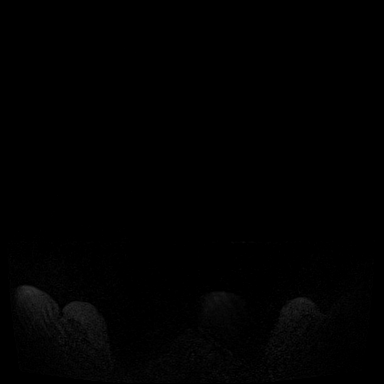

[Series 4: fl3d pre-cm · axial · non-contrast · 0.9mm · 0.96mm/px · z∈[-69,+146]mm · 5 of 240 slices shown]
[im 1/240]
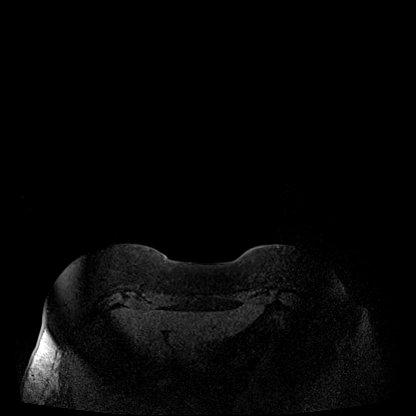
[im 60/240]
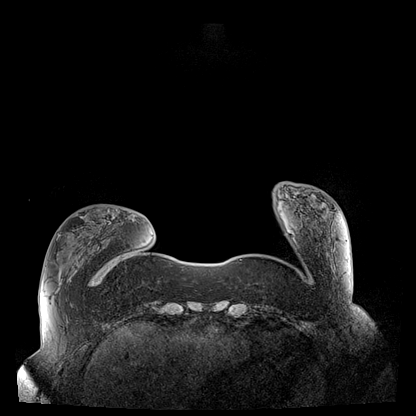
[im 120/240]
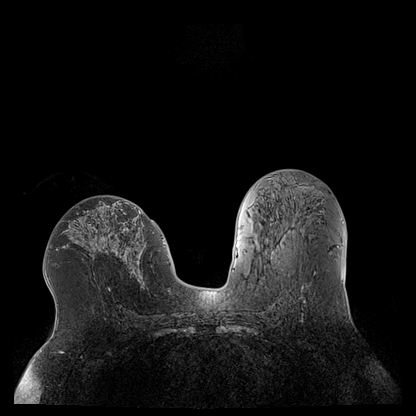
[im 180/240]
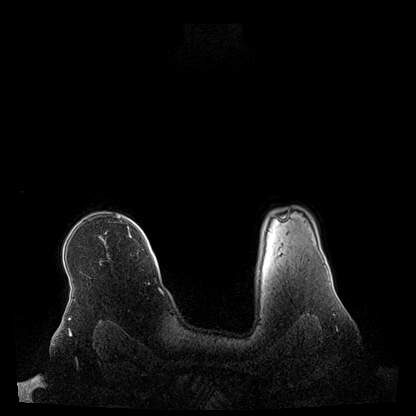
[im 240/240]
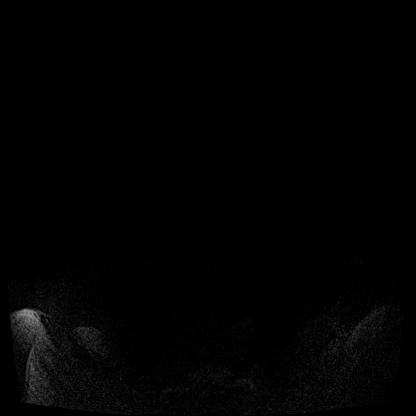

[Series 5: fl3d post-cm 20 · axial · 0.9mm · 0.96mm/px · z∈[-69,+146]mm · 5 of 240 slices shown (1 of 3)]
[im 1/240]
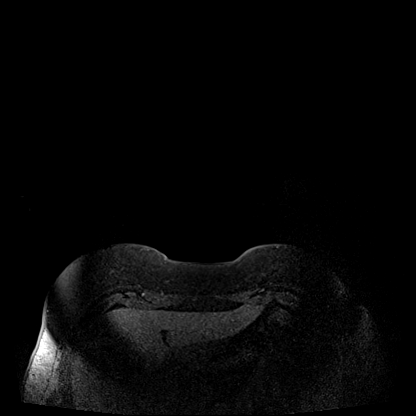
[im 60/240]
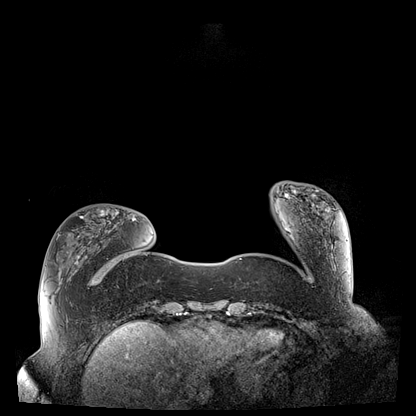
[im 120/240]
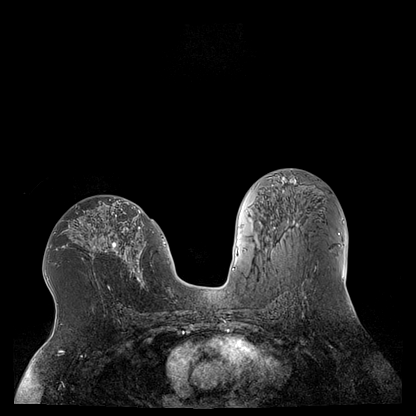
[im 180/240]
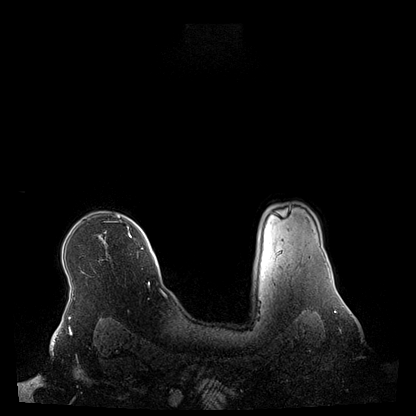
[im 240/240]
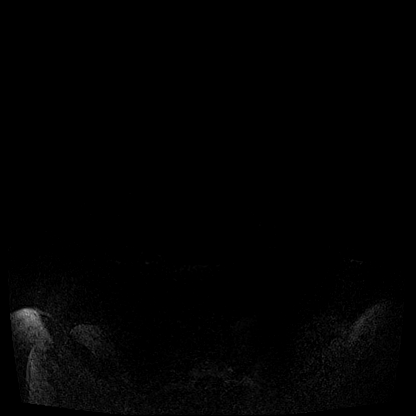

[Series 6: fl3d post-cm 20 · axial · 0.9mm · 0.96mm/px · z∈[-69,+146]mm · 5 of 240 slices shown (2 of 3)]
[im 1/240]
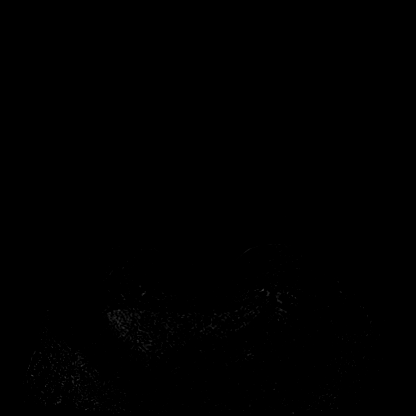
[im 60/240]
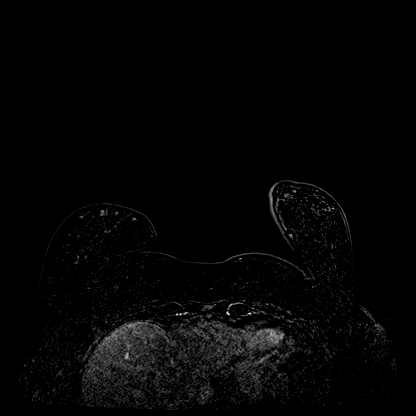
[im 120/240]
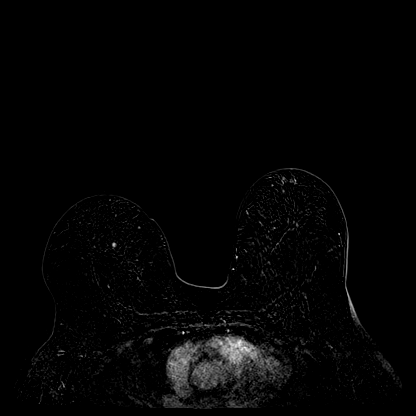
[im 180/240]
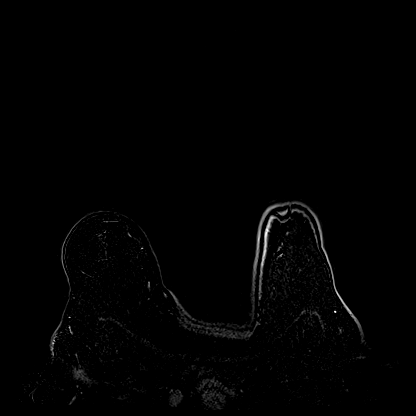
[im 240/240]
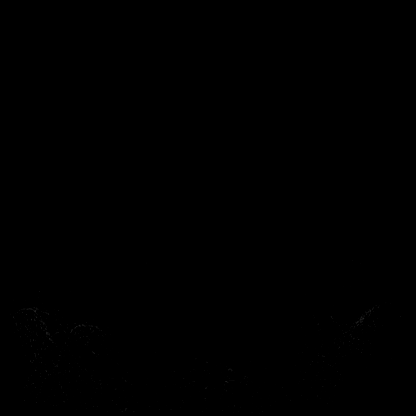

[Series 7: fl3d post-cm 20 · axial · 216.0mm · 0.96mm/px · 1 of 1 slices shown (3 of 3)]
[im 1/1]
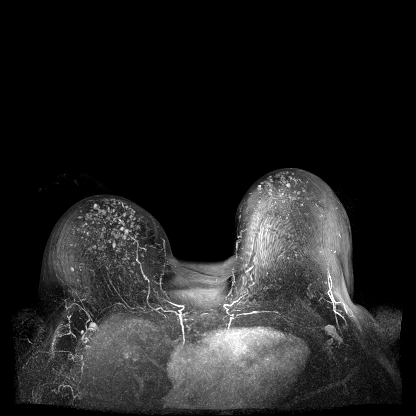

[Series 8: fl3d post-cm 3min · axial · 0.9mm · 0.96mm/px · z∈[-69,+146]mm · 6 of 240 slices shown]
[im 1/240]
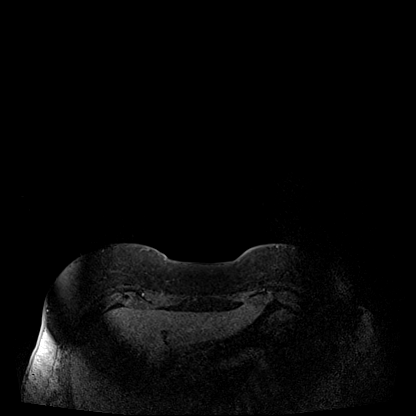
[im 48/240]
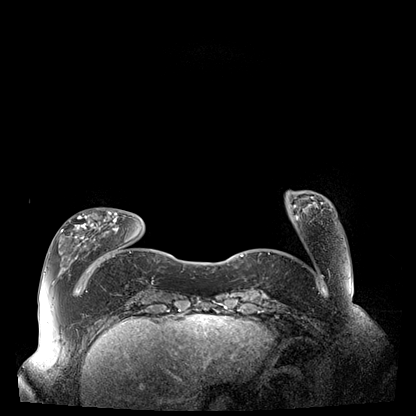
[im 96/240]
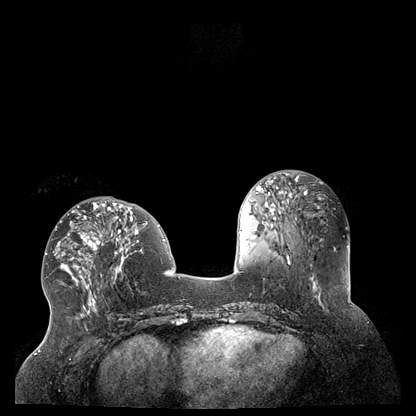
[im 144/240]
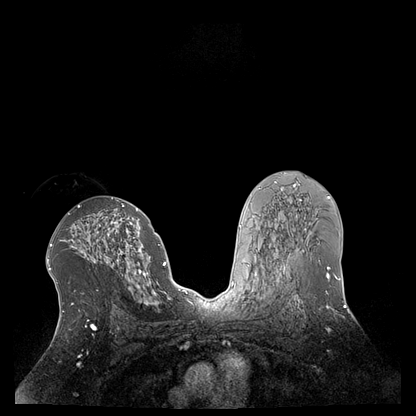
[im 192/240]
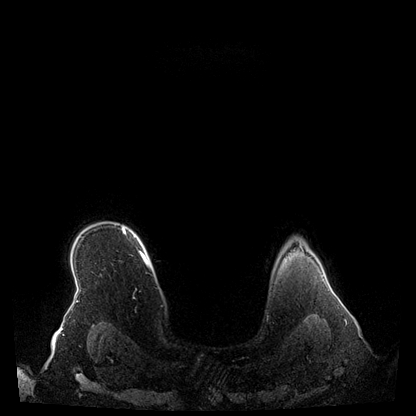
[im 240/240]
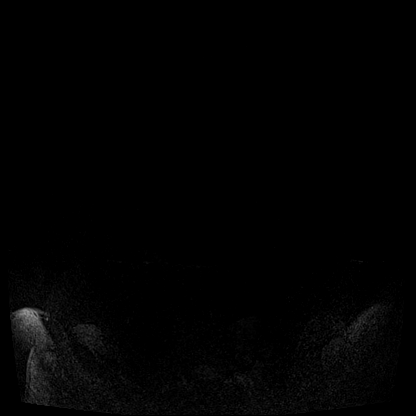

[Series 9: fl3d post-cm 3min_sub · axial · 0.9mm · 0.96mm/px · 1 of 240 slices shown]
[im 1/240]
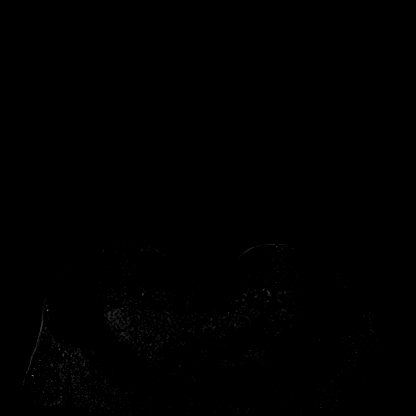

[29 of 48 positions shown; findings below may reference images not displayed]

Three-dimensional MR images were rendered by post-processing of the
original MR data on an independent workstation. The
three-dimensional MR images were interpreted, and findings are
reported in the following complete MRI report for this study. Three
dimensional images were evaluated at the independent interpreting
workstation using the DynaCAD thin client.
FINDINGS: Breast composition: d. Extreme fibroglandular tissue.

Background parenchymal enhancement: Moderate

Right breast: Numerous similar appearing scattered foci within the
RIGHT breast identified. No suspicious mass or enhancement noted.

Left breast: Numerous similar appearing scattered foci within the
LEFT breast identified. No suspicious mass or enhancement noted.
Biopsy clip artifact within the UPPER-OUTER RETROAREOLAR LEFT breast
is identified.

Lymph nodes: No abnormal appearing lymph nodes.

Ancillary findings:  None.
IMPRESSION: 1. No findings suspicious for breast malignancy.
2. Numerous similar appearing scattered foci within both breast.
Given bilateral similarity and stable mammographic appearance from
[WK], no further imaging follow-up is recommended.
3. Biopsy clip artifact within the UPPER OUTER RETROAREOLAR LEFT
breast at the site of biopsy-proven myoepithelioma.

RECOMMENDATION:
1. Surgical/clinical follow-up of LEFT nipple discharge.
2. Bilateral screening mammogram in [DATE] to resume annual
mammogram schedule.

BI-RADS CATEGORY  2: Benign.

## 2020-02-25 MED ORDER — GADOBUTROL 1 MMOL/ML IV SOLN
8.0000 mL | Freq: Once | INTRAVENOUS | Status: AC | PRN
  Administered 2020-02-25: 8 mL via INTRAVENOUS

## 2020-02-25 NOTE — H&P (Signed)
History of Present Illness Valerie Bauer. Zakara Parkey MD; 02/25/2020 12:51 PM) The patient is a 50 year old female who presents with a breast mass. Referred by [name], D.O. for left nipple discharge  This is a 50 year old female with a family history of breast cancer in her mother who passed away of breast cancer. She presents with a one-year history of intermittent clear nipple discharge from the left nipple. This occurs 2 or 3 days a month on a monthly basis. She underwent workup including mammogram and ultrasound. Mammogram was unremarkable. Ultrasound showed normal caliber ducts in the subareolar location. At 2:00 a proximally 1 cm from the center of the nipple is oval mass measuring 1.2 x 0.6 x 1.1 cm. The axilla was negative. Subsequently she underwent ultrasound-guided biopsy of the mass at 2:00. This revealed the diagnosis of myoepithelioma. She is scheduled to undergo MRI later today. She presents now to discuss her surgical options.  CLINICAL DATA: 50 year old presenting with a spontaneous clear LEFT nipple discharge that occurs 2-3 nights per week over the past 7-8 months. The patient states that the discharge arises from a single spot on the nipple. The patient had her initial COVID-19 vaccine in the LEFT arm in October, 2021, and has possible palpable LEFT axillary lymphadenopathy.  Family history of breast cancer in her mother at age 49.  EXAM: DIGITAL DIAGNOSTIC LEFT MAMMOGRAM WITH CAD AND TOMO  ULTRASOUND LEFT BREAST  COMPARISON: Previous exam(s).  ACR Breast Density Category d: The breast tissue is extremely dense, which lowers the sensitivity of mammography.  FINDINGS: Tomosynthesis and synthesized digital 2D full field CC and MLO views of the LEFT breast were obtained. Tomosynthesis and synthesized spot compression tangential view of the retroareolar LEFT breast was also obtained. Mammographic images were processed with CAD.  No mammographic abnormalities  in the retroareolar LEFT breast to explain the patient's nipple discharge. No findings suspicious for malignancy in the LEFT breast. An upper normal sized lymph node in the low LEFT axilla on the MLO images is unchanged dating back to the 2017 mammograms and is therefore benign.  Targeted ultrasound is performed, showing upper normal caliber ducts in the subareolar location. At the 2 o'clock position subareolar location approximately 1 cm from the nipple is an oval parallel circumscribed hypoechoic mass measuring approximately 1.2 x 0.6 x 1.1 cm, demonstrating no posterior characteristics and demonstrating peripheral power Doppler flow.  Sonographic evaluation of the LEFT axilla demonstrates no pathologic lymphadenopathy.  IMPRESSION: 1. Indeterminate 1.2 cm mass in the 2 o'clock subareolar location of the LEFT breast approximately 1 cm from the nipple. Papilloma is to be excluded given the patient's history of a single duct clear nipple discharge. 2. No pathologic LEFT axillary lymphadenopathy.  RECOMMENDATION: Ultrasound-guided core needle biopsy of subareolar LEFT breast mass.  The ultrasound core needle biopsy procedure was discussed with the patient and her questions were answered. She wishes to proceed and the biopsy has been scheduled at her convenience.  I have discussed the findings and recommendations with the patient.  BI-RADS CATEGORY 4: Suspicious.   Electronically Signed By: Evangeline Dakin M.D. On: 01/04/2020 09:51  CLINICAL DATA: Biopsy of the 2 o'clock retroareolar left breast  EXAM: ULTRASOUND GUIDED LEFT BREAST CORE NEEDLE BIOPSY  COMPARISON: Previous exam(s).  PROCEDURE: I met with the patient and we discussed the procedure of ultrasound-guided biopsy, including benefits and alternatives. We discussed the high likelihood of a successful procedure. We discussed the risks of the procedure, including infection, bleeding, tissue injury, clip  migration, and inadequate sampling. Informed written consent was given. The usual time-out protocol was performed immediately prior to the procedure.  Lesion quadrant: 2 o'clock retroareolar  Using sterile technique and 1% Lidocaine as local anesthetic, under direct ultrasound visualization, a 12 gauge spring-loaded device was used to perform biopsy of a left 2 o'clock retroareolar mass using a lateral approach. At the conclusion of the procedure a coil shaped tissue marker clip was deployed into the biopsy cavity. Follow up 2 view mammogram was performed and dictated separately.  IMPRESSION: Ultrasound guided biopsy of a 2 o'clock left breast mass. No apparent complications.  Electronically Signed: By: Dorise Bullion III M.D On: 01/18/2020 11:30   Problem List/Past Medical Valerie Bauer. Calianna Kim, MD; 02/25/2020 12:51 PM) DISCHARGE FROM LEFT NIPPLE (N64.52) SUBAREOLAR MASS OF LEFT BREAST (F64.33)  Past Surgical History Mammie Lorenzo, LPN; 03/29/5186 41:66 AM) Breast Biopsy Left. Hysterectomy (not due to cancer) - Partial  Diagnostic Studies History Mammie Lorenzo, LPN; 0/07/3014 01:09 AM) Colonoscopy never Mammogram within last year Pap Smear 1-5 years ago  Allergies Mammie Lorenzo, LPN; 04/20/3555 32:20 AM) No Known Drug Allergies [02/25/2020]: Allergies Reconciled  Medication History Mammie Lorenzo, LPN; 03/25/4268 62:37 AM) Atorvastatin Calcium (40MG Tablet, Oral) Active. Chlorthalidone (25MG Tablet, Oral) Active. Ibuprofen (600MG Tablet, Oral) Active. Jardiance (25MG Tablet, Oral) Active. Levothyroxine Sodium (125MCG Tablet, Oral) Active. Losartan Potassium (50MG Tablet, Oral) Active. Trulicity (6.28BT/5.1VO Soln Pen-inj, Subcutaneous) Active. Valtrex (500MG Tablet, Oral) Active. Medications Reconciled  Social History Mammie Lorenzo, LPN; 02/24/735 10:62 AM) Alcohol use Remotely quit alcohol use. Caffeine use Carbonated beverages, Tea. No drug  use Tobacco use Never smoker.  Family History Mammie Lorenzo, LPN; 07/27/4852 62:70 AM) Alcohol Abuse Father. Breast Cancer Mother. Colon Cancer Family Members In General. Heart Disease Father. Hypertension Father. Prostate Cancer Father. Thyroid problems Daughter.  Pregnancy / Birth History Mammie Lorenzo, LPN; 04/23/91 81:82 AM) Age at menarche 53 years. Gravida 6 Maternal age 69-20 Para 3  Other Problems Valerie Bauer. Shakerra Red, MD; 02/25/2020 12:51 PM) Diabetes Mellitus High blood pressure Hypercholesterolemia Thyroid Disease     Review of Systems Claiborne Billings [facility] LPN; 10/28/3714 96:78 AM) General Not Present- Appetite Loss, Chills, Fatigue, Fever, Night Sweats, Weight Gain and Weight Loss. Skin Not Present- Change in Wart/Mole, Dryness, Hives, Jaundice, New Lesions, Non-Healing Wounds, Rash and Ulcer. HEENT Present- Wears glasses/contact lenses. Not Present- Earache, Hearing Loss, Hoarseness, Nose Bleed, Oral Ulcers, Ringing in the Ears, Seasonal Allergies, Sinus Pain, Sore Throat, Visual Disturbances and Yellow Eyes. Respiratory Not Present- Bloody sputum, Chronic Cough, Difficulty Breathing, Snoring and Wheezing. Breast Present- Nipple Discharge. Not Present- Breast Mass, Breast Pain and Skin Changes. Cardiovascular Present- Palpitations. Not Present- Chest Pain, Difficulty Breathing Lying Down, Leg Cramps, Rapid Heart Rate, Shortness of Breath and Swelling of Extremities. Gastrointestinal Not Present- Abdominal Pain, Bloating, Bloody Stool, Change in Bowel Habits, Chronic diarrhea, Constipation, Difficulty Swallowing, Excessive gas, Gets full quickly at meals, Hemorrhoids, Indigestion, Nausea, Rectal Pain and Vomiting. Female Genitourinary Not Present- Frequency, Nocturia, Painful Urination, Pelvic Pain and Urgency. Musculoskeletal Not Present- Back Pain, Joint Pain, Joint Stiffness, Muscle Pain, Muscle Weakness and Swelling of Extremities. Neurological Not Present-  Decreased Memory, Fainting, Headaches, Numbness, Seizures, Tingling, Tremor, Trouble walking and Weakness. Psychiatric Not Present- Anxiety, Bipolar, Change in Sleep Pattern, Depression, Fearful and Frequent crying. Endocrine Not Present- Cold Intolerance, Excessive Hunger, Hair Changes, Heat Intolerance, Hot flashes and New Diabetes. Hematology Not Present- Blood Thinners, Easy Bruising, Excessive bleeding, Gland problems, HIV and Persistent Infections.  Vitals Claiborne Billings [place]ery LPN; 10/21/8099 75:10  AM) 02/25/2020 10:56 AM Weight: 188 lb Height: 60in Body Surface Area: 1.82 m Body Mass Index: 36.72 kg/m  Temp.: 97.7F(Infrared)  Pulse: 92 (Regular)  BP: 138/86(Sitting, Left Arm, Standard)        Physical Exam Rodman Key K. Beva Remund MD; 02/25/2020 12:52 PM)  The physical exam findings are as follows: Note:Constitutional: WDWN in NAD, conversant, no obvious deformities; resting comfortably Eyes: Pupils equal, round; sclera anicteric; moist conjunctiva; no lid lag HENT: Oral mucosa moist; good dentition Neck: No masses palpated, trachea midline; no thyromegaly Lungs: CTA bilaterally; normal respiratory effort Breasts: symmetric; some elicited discharge from the center of the left nipple - clear; bilateral fibrocystic changes; palpable subareolar mass left breast at 0700 measuring 1 cm, smooth firm; no palpable masses at 0200. No other palpable masses or lymphadenopathy CV: Regular rate and rhythm; no murmurs; extremities well-perfused with no edema Abd: +bowel sounds, soft, non-tender, no palpable organomegaly; no palpable hernias Musc: Normal gait; no apparent clubbing or cyanosis in extremities Lymphatic: No palpable cervical or axillary lymphadenopathy Skin: Warm, dry; no sign of jaundice Psychiatric - alert and oriented x 4; calm mood and affect    Assessment & Plan Rodman Key K. Madilyne Tadlock MD; 02/25/2020 11:17 AM)  DISCHARGE FROM LEFT NIPPLE (N64.52)   SUBAREOLAR MASS OF LEFT  BREAST (N63.42)  Current Plans Schedule for Surgery - Left radioactive seed localized lumpectomy. The surgical procedure has been discussed with the patient. Potential risks, benefits, alternative treatments, and expected outcomes have been explained. All of the patient's questions at this time have been answered. The likelihood of reaching the patient's treatment goal is good. The patient understand the proposed surgical procedure and wishes to proceed.   Await results of MRI before scheduling surgery  Valerie Bauer. Georgette Dover, MD, [facility]  General/ Trauma Surgery   02/25/2020 12:53 PM

## 2020-03-07 ENCOUNTER — Telehealth (INDEPENDENT_AMBULATORY_CARE_PROVIDER_SITE_OTHER): Payer: 59 | Admitting: Family Medicine

## 2020-03-07 DIAGNOSIS — E039 Hypothyroidism, unspecified: Secondary | ICD-10-CM

## 2020-03-07 DIAGNOSIS — E119 Type 2 diabetes mellitus without complications: Secondary | ICD-10-CM

## 2020-03-07 DIAGNOSIS — I10 Essential (primary) hypertension: Secondary | ICD-10-CM | POA: Diagnosis not present

## 2020-03-07 NOTE — Assessment & Plan Note (Signed)
Intermittently takes synthroid.  Discussed importance of compliance Follow up on 03/23/20 for TSH and medication adjustments if indicated

## 2020-03-07 NOTE — Assessment & Plan Note (Signed)
Chronic, improved. Nonsmoker. On statin.  Does occasionally have dizziness in setting of lower BP's.  Decrease Chlorthalidone to 12.5mg  QD Continue Losartan 50mg  QD Follow up 03/23/20 for  BP recheck and BMP to monitor electrolytes

## 2020-03-07 NOTE — Progress Notes (Signed)
[facility] Visit  Patient consented to have virtual visit and was identified by name and date of birth. Method of visit: Video  Encounter participants: Patient: Valerie Bauer - located at none Provider: Danna Hefty - located at none Others (if applicable): none  Chief Complaint: BP follow up  HPI:  HTN:  114/88 today. Currently on Chlorthalidone 25mg  QD and Losartan 50mg  QD. Endorses compliance. Non-smoker. Denies any chest pain, SOB, vision changes, or headaches. She notes feeling a little dizzy at times, at least once or twice a day. Can happen when sitting and standing. No falls. Prior BP's: January: 102/81, December 119/87, 116/86, 112/83.  ROS: per HPI  Pertinent PMHx: HTN, HLD, hypothyroidism, T2DM, breast discharge, obesity  Exam:  LMP 01/30/2019   Gen: Sitting up comfortably  Respiratory: breathing comfortably on room air, speaking in full sentences  Assessment/Plan:  Primary hypertension Chronic, improved. Nonsmoker. On statin.  Does occasionally have dizziness in setting of lower BP's.  Decrease Chlorthalidone to 12.5mg  QD Continue Losartan 50mg  QD Follow up 03/23/20 for  BP recheck and BMP to monitor electrolytes  Hypothyroidism (acquired) Intermittently takes synthroid.  Discussed importance of compliance Follow up on 03/23/20 for TSH and medication adjustments if indicated  Type 2 diabetes mellitus without complication, without long-term current use of insulin (HCC) Follow up 03/23/20 for A1C and diabetes recheck  03/23/20 at 8:30am for DM, thyroid, and HTN follow up with labs (BMP, TSH, A1C)  Time spent during visit with patient: 20 minutes  [name], [place], PGY3 03/07/2020 9:14 AM

## 2020-03-07 NOTE — Assessment & Plan Note (Signed)
Follow up 03/23/20 for A1C and diabetes recheck

## 2020-03-07 NOTE — Addendum Note (Signed)
Addended by: Owens Shark, Tynslee Bowlds on: 03/07/2020 09:24 AM   Modules accepted: Level of Service

## 2020-03-21 NOTE — Progress Notes (Signed)
   Subjective:   Patient ID: Valerie Bauer    DOB: 07-20-1970, 50 y.o. female   MRN: [id number]  Valerie Bauer is a 50 y.o. female with a history of HTN, hypothyroidism, T2DM, HLD, breast discharge, obesity here for chronic care management  Diabetes: Last three A1C's below. Currently on Jardiance 25mg  QD, Metformin 1000mg  BID, Trulicity 0.75mg  qweekly. Endorses compliance. Notes CBGs range 170-200. Denies any hypoglycemia. Denies any polyuria, polydipsia, polyphagia. Due for diabetic eye exam. Has lost 3 lbs since November.   Lab Results  Component Value Date   HGBA1C 10.3 (A) 03/23/2020   HGBA1C 12.6 (A) 11/30/2019   HGBA1C 6.5 12/25/2018    HTN:  BP: 130/75 today. Currently on Losartan 50mg  QD and Chlorthalidone 12.5mg  QD. Recently decreased chlorthalidone due to dizziness in setting of lower BP's. Endorses compliance. Non-smoker. Denies any chest pain, SOB, vision changes, or headaches. Denies any further lightheadedness/dizzy. Home BP's: 114-119/80-90.   Hypothyroidism: Currently on Synthroid 142mcg QD. Previously endorsed intermittently taking Synthroid. Today she notes since our last visit she has been taking it every day on an empty stomach. Denies any unexplained weight loss/gain, worsening anxiety, skin/hair changes, diarrhea/constipation.  Review of Systems:  Per HPI.   Objective:   BP 130/75   Pulse 77   Ht 5' (1.524 m)   Wt 183 lb 3.2 oz (83.1 kg)   LMP 01/30/2019   SpO2 97%   BMI 35.78 kg/m  Vitals and nursing note reviewed.  General: pleasant older woman, sitting comfortably in exam chair, well nourished, well developed, in no acute distress with non-toxic appearance CV: regular rate and rhythm without murmurs, rubs, or gallops, no lower extremity edema, 2+ radial and pedal pulses bilaterally Lungs: clear to auscultation bilaterally with normal work of breathing on room air, speaking in full sentences Skin: warm, dry MSK:  gait normal Neuro: Alert and  oriented, speech normal  Assessment & Plan:   Primary hypertension Chronic. Well controlled after recent adjustments. Nonsmoker. - Continue Losartan 50mg  QD and Chlorthalidone 12.5mg  QD - follow up 3 months  Hypothyroidism (acquired) Chronic. Compliant with Synthroid. TSH today. Adjust if indicated.  Type 2 diabetes mellitus without complication, without long-term current use of insulin (HCC) Chronic. Improving. On statin. Nonsmoker. - Increase Trulicity 1.5mg  q weekly - continue Jardiacne 25mg  QD - Continue Metformin 1000mg  BID - Diabetic eye exam rescheduled due to weather. Waiting for the new appointment date.  - follow up 3 months   Health Maintenance: Referral for colonoscopy ordered  Orders Placed This Encounter  Procedures  . Basic Metabolic Panel  . TSH  . Ambulatory referral to Gastroenterology    Referral Priority:   Routine    Referral Type:   Consultation    Referral Reason:   Specialty Services Required    Number of Visits Requested:   1  . POCT glycosylated hemoglobin (Hb A1C)   Meds ordered this encounter  Medications  . valACYclovir (VALTREX) 500 MG tablet    Sig: Take 1 tablet (500 mg total) by mouth daily. Can increase to twice a day for 5 days in the event of a recurrence    Dispense:  30 tablet    Refill:  12  . Dulaglutide (TRULICITY) 1.5 NU/2.7OZ SOPN    Sig: Inject 1.5 mg into the skin once a week.    Dispense:  2 mL    Refill:  [place], DO PGY-3, [facility] 03/23/2020 1:07 PM

## 2020-03-23 ENCOUNTER — Other Ambulatory Visit: Payer: Self-pay

## 2020-03-23 ENCOUNTER — Ambulatory Visit: Payer: 59 | Admitting: Family Medicine

## 2020-03-23 ENCOUNTER — Encounter: Payer: Self-pay | Admitting: Family Medicine

## 2020-03-23 VITALS — BP 130/75 | HR 77 | Ht 60.0 in | Wt 183.2 lb

## 2020-03-23 DIAGNOSIS — E119 Type 2 diabetes mellitus without complications: Secondary | ICD-10-CM | POA: Diagnosis not present

## 2020-03-23 DIAGNOSIS — Z1211 Encounter for screening for malignant neoplasm of colon: Secondary | ICD-10-CM | POA: Diagnosis not present

## 2020-03-23 DIAGNOSIS — E039 Hypothyroidism, unspecified: Secondary | ICD-10-CM | POA: Diagnosis not present

## 2020-03-23 DIAGNOSIS — I10 Essential (primary) hypertension: Secondary | ICD-10-CM | POA: Diagnosis not present

## 2020-03-23 DIAGNOSIS — A6000 Herpesviral infection of urogenital system, unspecified: Secondary | ICD-10-CM

## 2020-03-23 LAB — POCT GLYCOSYLATED HEMOGLOBIN (HGB A1C): Hemoglobin A1C: 10.3 % — AB (ref 4.0–5.6)

## 2020-03-23 MED ORDER — VALACYCLOVIR HCL 500 MG PO TABS: 500.0000 mg | ORAL_TABLET | Freq: Every day | ORAL | 12 refills | Status: DC

## 2020-03-23 MED ORDER — TRULICITY 1.5 MG/0.5ML ~~LOC~~ SOAJ: 1.5000 mg | SUBCUTANEOUS | 2 refills | Status: DC

## 2020-03-23 NOTE — Patient Instructions (Signed)
It was a pleasure to see you today!  Thank you for choosing [facility] for your primary care.   Our plans for today were:  A1C improved from 12.6 to 10.3!! You have lost 3 lbs!   I have increased your Trulicity to 1.5 once a week  Continue your Metformin 100mg  twice a day and Jardiance 25mg   Contiue your Losartan 50mg  daily and Chlorthalidone 12.5mg  daily  Continue your Synthroid 11mcg. I am check your level today and let you know if you need to change your medication    To keep you healthy, please keep in mind the following health maintenance items that you are due for:   1. Diabetic eye exam 2. Colonoscopy  - referral has been placed. Expect a call to schedule.    You should return to our clinic in 3 months for diabetes follow up.   Best Wishes,   [name], DO

## 2020-03-23 NOTE — Assessment & Plan Note (Signed)
Chronic. Compliant with Synthroid. TSH today. Adjust if indicated.

## 2020-03-23 NOTE — Assessment & Plan Note (Addendum)
Chronic. Well controlled after recent adjustments. Nonsmoker. - Continue Losartan 50mg  QD and Chlorthalidone 12.5mg  QD - follow up 3 months

## 2020-03-23 NOTE — Assessment & Plan Note (Signed)
Chronic. Improving. On statin. Nonsmoker. - Increase Trulicity 1.5mg  q weekly - continue Jardiacne 25mg  QD - Continue Metformin 1000mg  BID - Diabetic eye exam rescheduled due to weather. Waiting for the new appointment date.  - follow up 3 months

## 2020-03-24 LAB — BASIC METABOLIC PANEL
BUN/Creatinine Ratio: 18 (ref 9–23)
BUN: 13 mg/dL (ref 6–24)
CO2: 24 mmol/L (ref 20–29)
Calcium: 9.1 mg/dL (ref 8.7–10.2)
Chloride: 101 mmol/L (ref 96–106)
Creatinine, Ser: 0.71 mg/dL (ref 0.57–1.00)
GFR calc Af Amer: 116 mL/min/{1.73_m2} (ref >59)
GFR calc non Af Amer: 100 mL/min/{1.73_m2} (ref >59)
Glucose: 173 mg/dL — ABNORMAL HIGH (ref 65–99)
Potassium: 3.5 mmol/L (ref 3.5–5.2)
Sodium: 141 mmol/L (ref 134–144)

## 2020-03-24 LAB — TSH: TSH: 2.91 u[IU]/mL (ref 0.450–4.500)

## 2020-03-30 ENCOUNTER — Other Ambulatory Visit: Payer: Self-pay | Admitting: Surgery

## 2020-03-30 DIAGNOSIS — N6320 Unspecified lump in the left breast, unspecified quadrant: Secondary | ICD-10-CM

## 2020-04-26 NOTE — Progress Notes (Signed)
Spoke with patient who stated that she had to cancel surgery d/t financial reasons. Pt stated that she notified [facility] as well.

## 2020-04-29 ENCOUNTER — Other Ambulatory Visit (HOSPITAL_COMMUNITY): Payer: 59

## 2020-05-03 ENCOUNTER — Ambulatory Visit (HOSPITAL_BASED_OUTPATIENT_CLINIC_OR_DEPARTMENT_OTHER): Admission: RE | Admit: 2020-05-03 | Payer: 59 | Source: Home / Self Care | Admitting: Surgery

## 2020-05-03 ENCOUNTER — Encounter (HOSPITAL_BASED_OUTPATIENT_CLINIC_OR_DEPARTMENT_OTHER): Admission: RE | Payer: Self-pay | Source: Home / Self Care

## 2020-05-03 SURGERY — BREAST LUMPECTOMY WITH RADIOACTIVE SEED LOCALIZATION
Anesthesia: General | Site: Breast | Laterality: Left

## 2020-05-05 ENCOUNTER — Other Ambulatory Visit: Payer: Self-pay | Admitting: Family Medicine

## 2020-05-05 DIAGNOSIS — E1169 Type 2 diabetes mellitus with other specified complication: Secondary | ICD-10-CM

## 2020-05-05 DIAGNOSIS — E785 Hyperlipidemia, unspecified: Secondary | ICD-10-CM

## 2020-06-07 ENCOUNTER — Other Ambulatory Visit: Payer: Self-pay | Admitting: Family Medicine

## 2020-06-07 DIAGNOSIS — D369 Benign neoplasm, unspecified site: Secondary | ICD-10-CM

## 2020-06-07 DIAGNOSIS — N6452 Nipple discharge: Secondary | ICD-10-CM

## 2020-06-09 ENCOUNTER — Other Ambulatory Visit: Payer: Self-pay | Admitting: Family Medicine

## 2020-06-09 DIAGNOSIS — I152 Hypertension secondary to endocrine disorders: Secondary | ICD-10-CM

## 2020-06-09 DIAGNOSIS — E1159 Type 2 diabetes mellitus with other circulatory complications: Secondary | ICD-10-CM

## 2020-07-14 ENCOUNTER — Other Ambulatory Visit: Payer: Self-pay

## 2020-07-14 ENCOUNTER — Ambulatory Visit
Admission: RE | Admit: 2020-07-14 | Discharge: 2020-07-14 | Disposition: A | Discharge status: Home / Self Care | Payer: 59 | Source: Ambulatory Visit | Attending: Family Medicine | Admitting: Family Medicine

## 2020-07-14 DIAGNOSIS — N6452 Nipple discharge: Secondary | ICD-10-CM

## 2020-07-14 DIAGNOSIS — D369 Benign neoplasm, unspecified site: Secondary | ICD-10-CM

## 2020-07-14 IMAGING — MG DIGITAL DIAGNOSTIC BILAT W/ TOMO W/ CAD
5 of 10 series · 5 of 30 positions shown · non-contrast
Comparison: Previous exams including ultrasound-guided LEFT breast
biopsy dated [DATE].
COMPARISON: Previous exams including ultrasound-guided LEFT breast
biopsy dated [DATE].

Addendum:
CLINICAL DATA: Bloody nipple discharge. History of suspicious
LEFT-sided spontaneous clear nipple discharge for greater than a
year. However patient states that the LEFT-sided nipple discharge is
now bloody.

Ultrasound-guided biopsy of a retroareolar LEFT breast mass (2
o'clock axis) on [DATE] revealed benign myoepithelioma.
EXAM:
DIGITAL DIAGNOSTIC BILATERAL MAMMOGRAM WITH TOMOSYNTHESIS AND CAD;
ULTRASOUND LEFT BREAST LIMITED
TECHNIQUE: Bilateral digital diagnostic mammography and breast tomosynthesis
was performed. The images were evaluated with computer-aided
detection.; Targeted ultrasound examination of the left breast was
performed

[R MLO synth-2D]
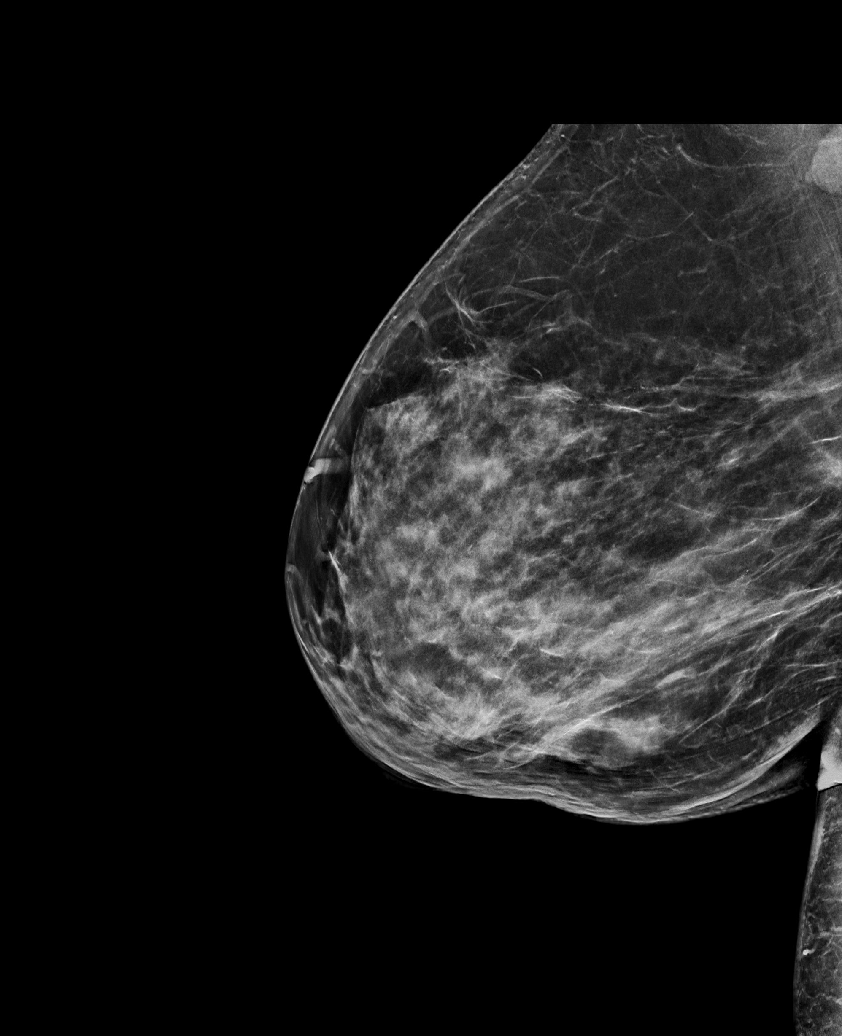

[L MLO synth-2D]
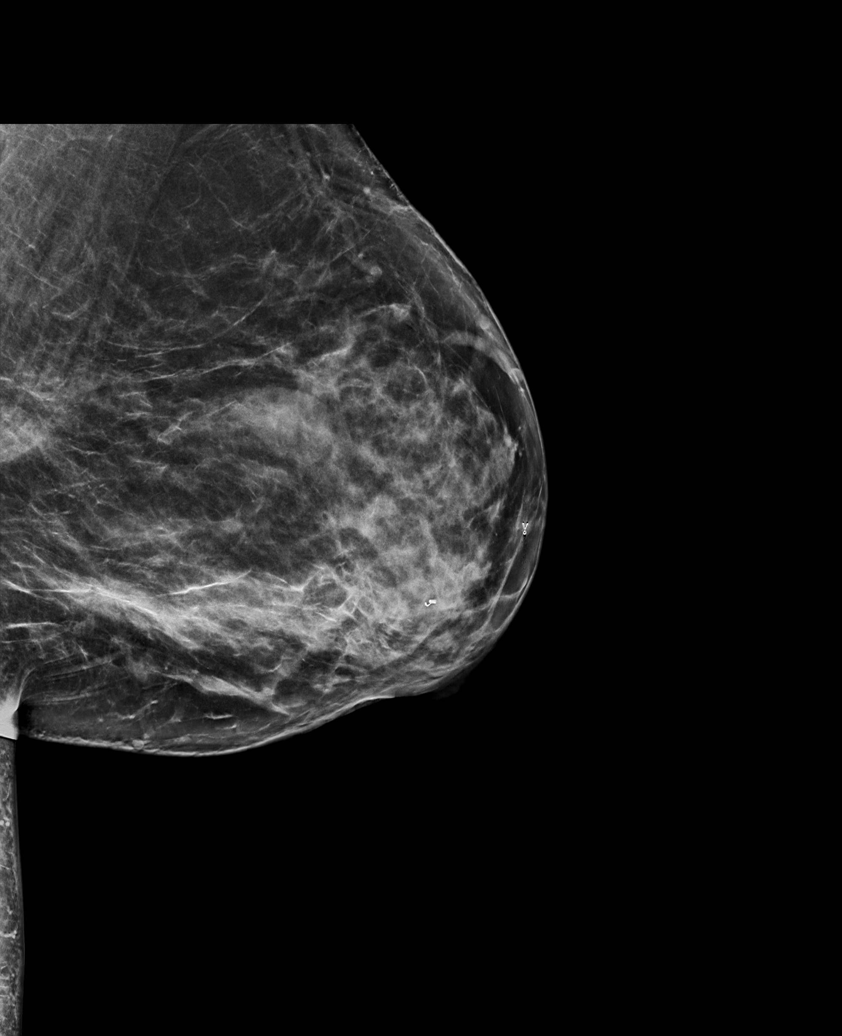

[L CC synth-2D (1 of 2)]
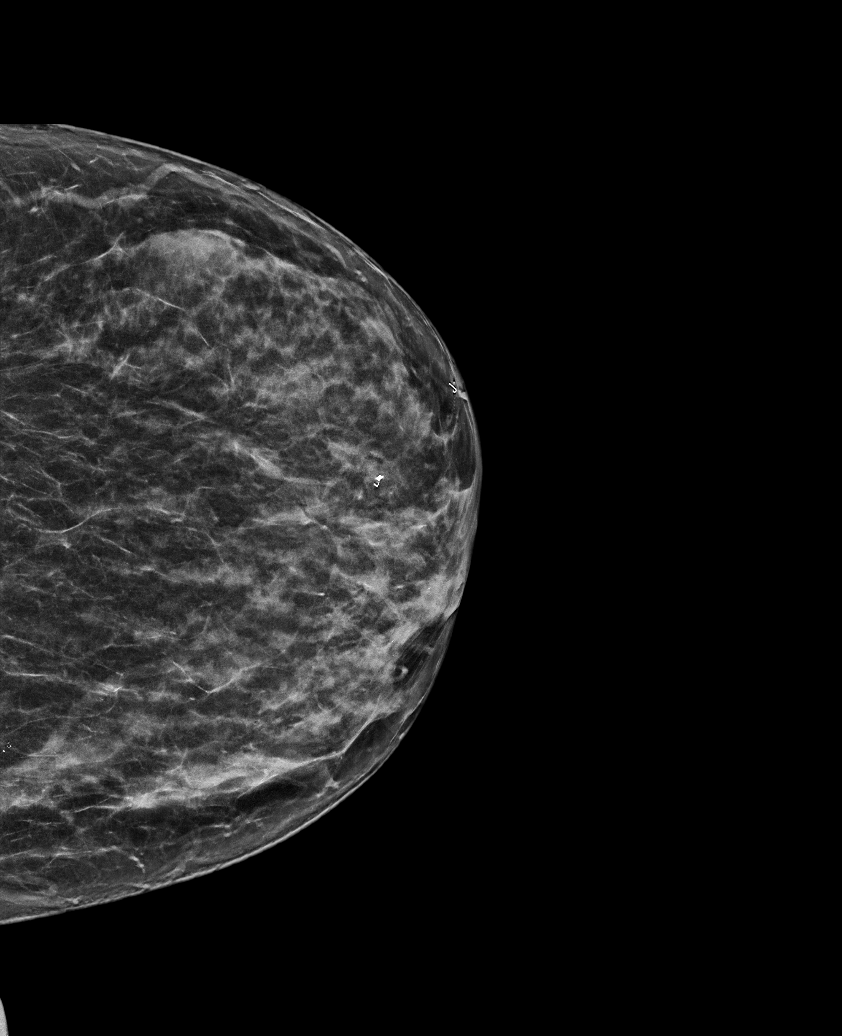

[L CC synth-2D (2 of 2)]
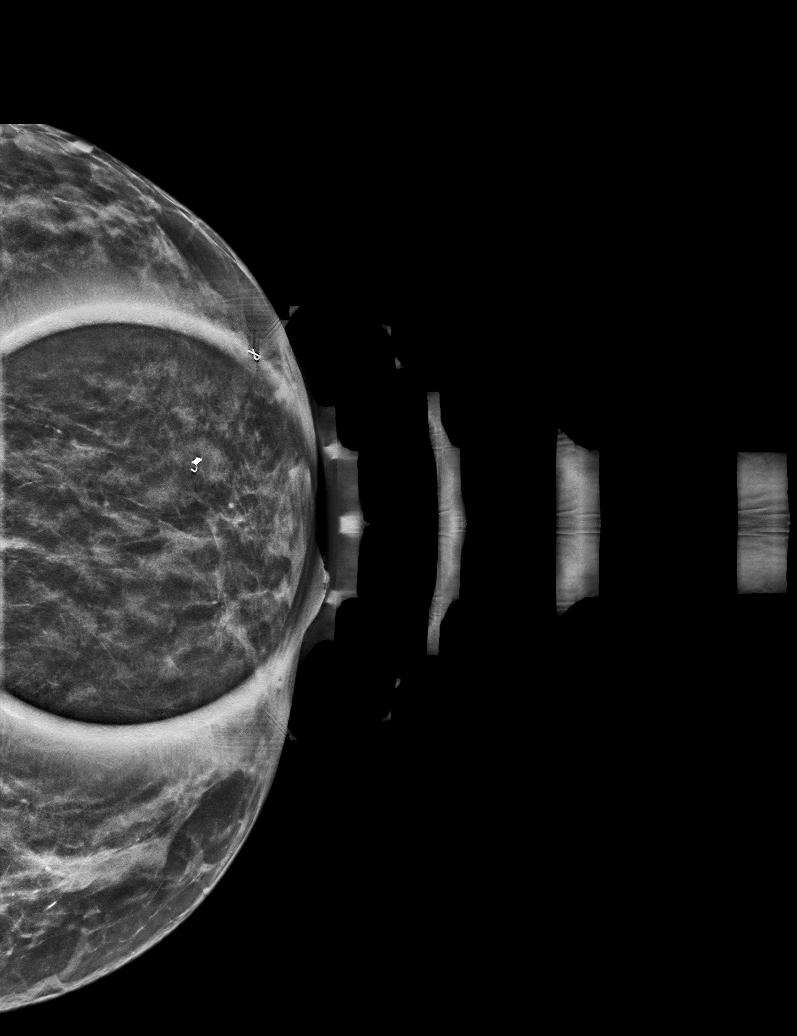

[R CC synth-2D]
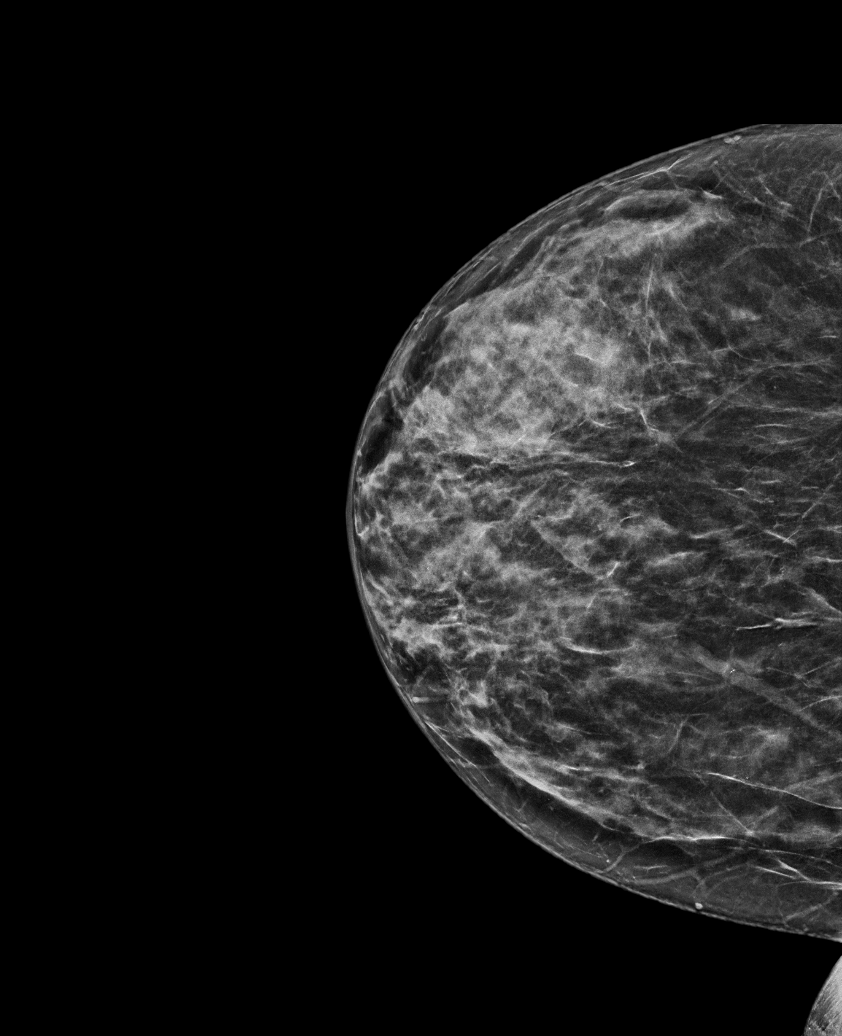

[5 of 30 positions shown; findings below may reference images not displayed]

ACR Breast Density Category c: The breast tissue is heterogeneously
dense, which may obscure small masses.
FINDINGS: Biopsy site marker within the LEFT breast is stable in position,
corresponding to the patient's known myoepithelioma. There are no
new dominant masses, suspicious calcifications or secondary signs of
malignancy within either breast.

Targeted ultrasound is performed, evaluating the subareolar LEFT
breast, showing no new solid or cystic mass. No intraductal mass or
vascularity. Again demonstrated is the hypoechoic mass at the 2
o'clock axis, 1 cm from the nipple, measuring 1.1 x 0.5 x 0.9 cm,
stable to slightly smaller compared to the previous ultrasound of
[DATE], compatible with the biopsy-proven myoepithelioma.
IMPRESSION: 1. No evidence of malignancy within either breast.
2. No source for patient's bloody LEFT-sided nipple discharge is
identified today.
3. Stable, or slightly smaller, mass within the retroareolar LEFT
breast, 2 o'clock axis, measuring 1.1 cm, corresponding to the
biopsy-proven myoepithelioma.

RECOMMENDATION:
1. Referral to breast surgeon for possible central duct excision as
there is no source identified as yet for patient's suspicious
LEFT-sided nipple discharge (now bloody), neither on today's exam or
on the recent breast MRI report of [DATE].
2. Per treatment plan for patient's biopsy-proven myoepithelioma
within the retroareolar LEFT breast, 2 o'clock axis. Recommendation
by Dr. FELNER on a biopsy report of [DATE] reads as follows:
"Myoepithelioma is a very rare lesion. The pathologist assured me
that there are no signs of malignancy. However, given the rare
nature of this mass, it was decided to follow biopsy mass in the
LEFT breast at 2 o'clock in 6 months with ultrasound to ensure
stability. However, if patient goes to surgery for the LEFT nipple
discharge, I would recommend localizing and removing the mass at
that time since it is in close proximity to the retroareolar
region." As above, the follow-up ultrasound recommended above was
performed today and shows no significant change in size of the
myoepithelioma.

I have discussed these findings and recommendations with the patient
today. If applicable, a reminder letter will be sent to the patient
regarding the next appointment.

BI-RADS CATEGORY  2: Benign. However, there remains suspicious
LEFT-sided bloody nipple discharge for which referral to breast
surgeon is recommended for possible central duct excision.

ADDENDUM:
Incidental note is made of a benign simple cyst in the LEFT breast
at the 3 o'clock axis, 8 cm from the nipple, measuring 2.5 cm.

*** End of Addendum ***
ACR Breast Density Category c: The breast tissue is heterogeneously
dense, which may obscure small masses.
FINDINGS: Biopsy site marker within the LEFT breast is stable in position,
corresponding to the patient's known myoepithelioma. There are no
new dominant masses, suspicious calcifications or secondary signs of
malignancy within either breast.

Targeted ultrasound is performed, evaluating the subareolar LEFT
breast, showing no new solid or cystic mass. No intraductal mass or
vascularity. Again demonstrated is the hypoechoic mass at the 2
o'clock axis, 1 cm from the nipple, measuring 1.1 x 0.5 x 0.9 cm,
stable to slightly smaller compared to the previous ultrasound of
[DATE], compatible with the biopsy-proven myoepithelioma.
IMPRESSION: 1. No evidence of malignancy within either breast.
2. No source for patient's bloody LEFT-sided nipple discharge is
identified today.
3. Stable, or slightly smaller, mass within the retroareolar LEFT
breast, 2 o'clock axis, measuring 1.1 cm, corresponding to the
biopsy-proven myoepithelioma.

RECOMMENDATION:
1. Referral to breast surgeon for possible central duct excision as
there is no source identified as yet for patient's suspicious
LEFT-sided nipple discharge (now bloody), neither on today's exam or
on the recent breast MRI report of [DATE].
2. Per treatment plan for patient's biopsy-proven myoepithelioma
within the retroareolar LEFT breast, 2 o'clock axis. Recommendation
by Dr. FELNER on a biopsy report of [DATE] reads as follows:
"Myoepithelioma is a very rare lesion. The pathologist assured me
that there are no signs of malignancy. However, given the rare
nature of this mass, it was decided to follow biopsy mass in the
LEFT breast at 2 o'clock in 6 months with ultrasound to ensure
stability. However, if patient goes to surgery for the LEFT nipple
discharge, I would recommend localizing and removing the mass at
that time since it is in close proximity to the retroareolar
region." As above, the follow-up ultrasound recommended above was
performed today and shows no significant change in size of the
myoepithelioma.

I have discussed these findings and recommendations with the patient
today. If applicable, a reminder letter will be sent to the patient
regarding the next appointment.

BI-RADS CATEGORY  2: Benign. However, there remains suspicious
LEFT-sided bloody nipple discharge for which referral to breast
surgeon is recommended for possible central duct excision.

## 2020-07-14 IMAGING — US US BREAST*L* LIMITED INC AXILLA
1 series · 14 of 14 positions shown · non-contrast
Comparison: Previous exams including ultrasound-guided LEFT breast
biopsy dated [DATE].
COMPARISON: Previous exams including ultrasound-guided LEFT breast
biopsy dated [DATE].

Addendum:
CLINICAL DATA: Bloody nipple discharge. History of suspicious
LEFT-sided spontaneous clear nipple discharge for greater than a
year. However patient states that the LEFT-sided nipple discharge is
now bloody.

Ultrasound-guided biopsy of a retroareolar LEFT breast mass (2
o'clock axis) on [DATE] revealed benign myoepithelioma.
EXAM:
DIGITAL DIAGNOSTIC BILATERAL MAMMOGRAM WITH TOMOSYNTHESIS AND CAD;
ULTRASOUND LEFT BREAST LIMITED
TECHNIQUE: Bilateral digital diagnostic mammography and breast tomosynthesis
was performed. The images were evaluated with computer-aided
detection.; Targeted ultrasound examination of the left breast was
performed

[Series 1: us breast*left* limited inc axilla · 0.07mm/px · 14 of 14 slices shown]
[im 1/14]
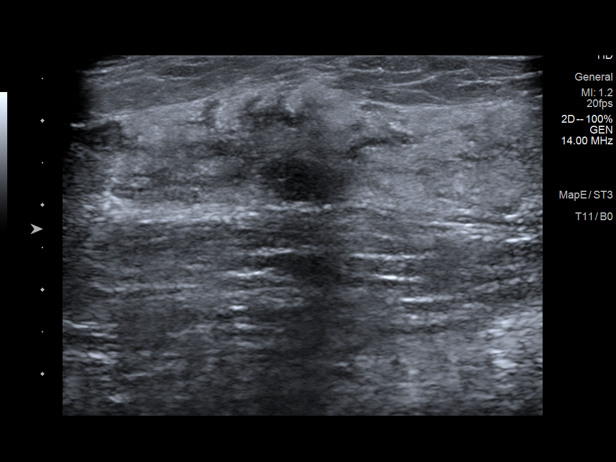
[im 2/14]
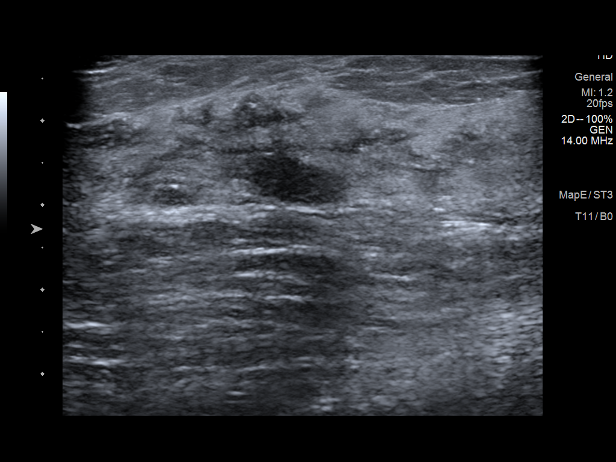
[im 3/14]
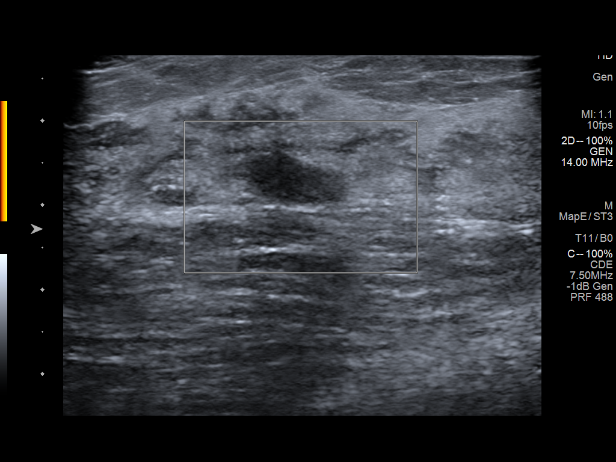
[im 4/14]
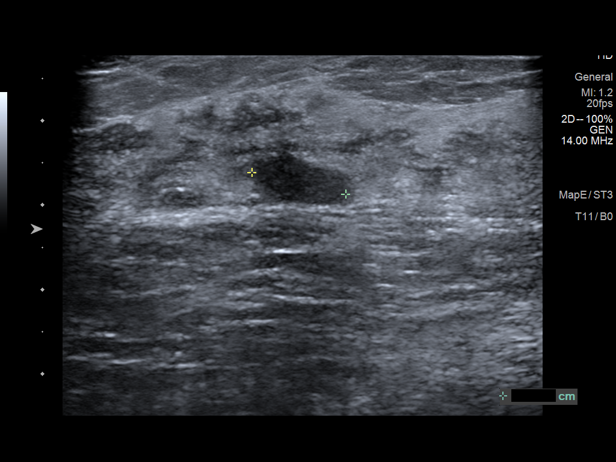
[im 5/14]
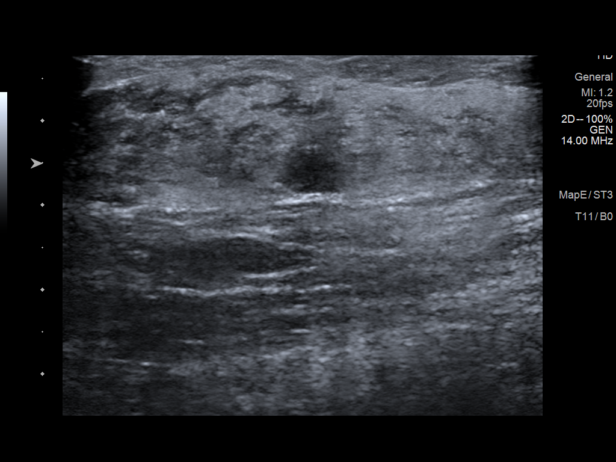
[im 6/14]
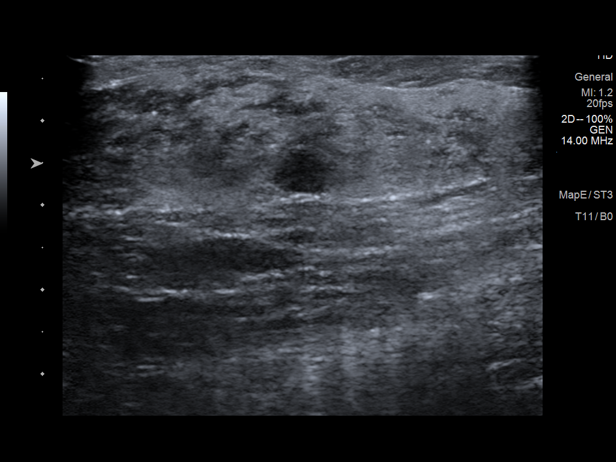
[im 7/14]
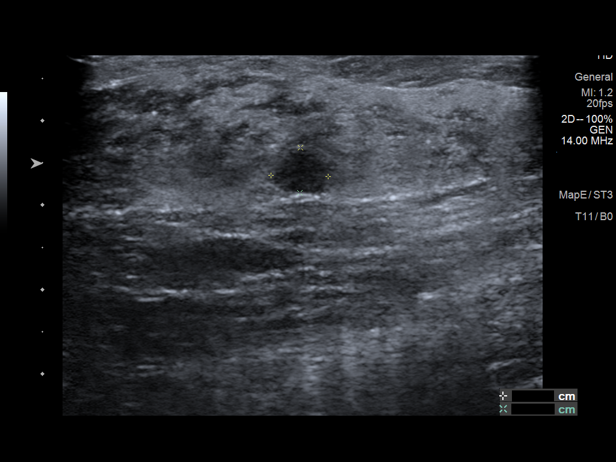
[im 8/14]
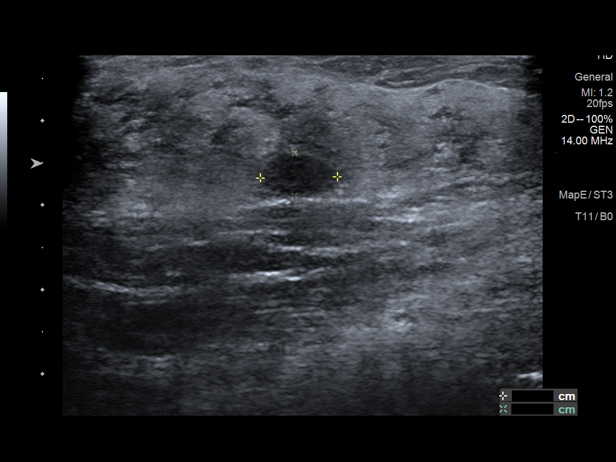
[im 9/14]
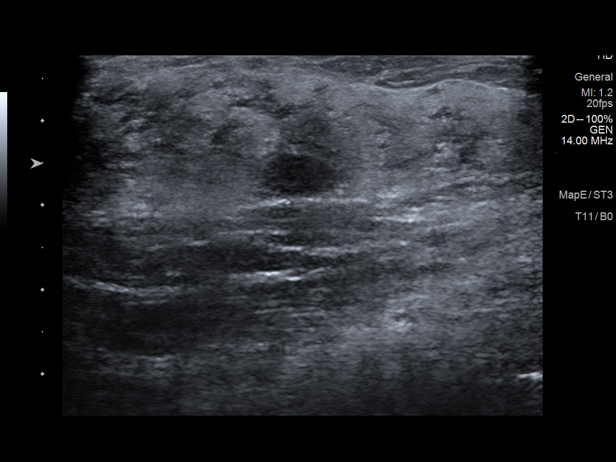
[im 10/14]
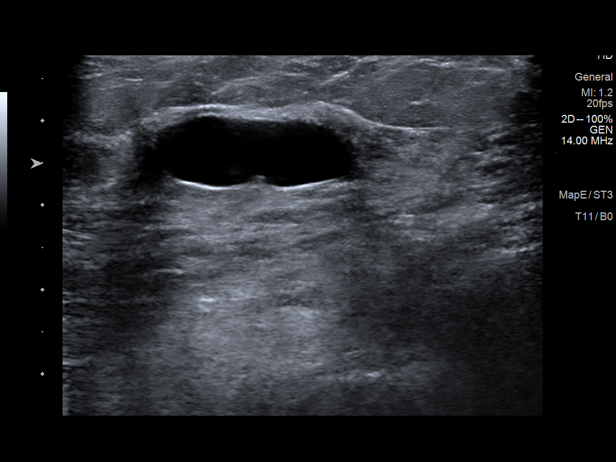
[im 11/14]
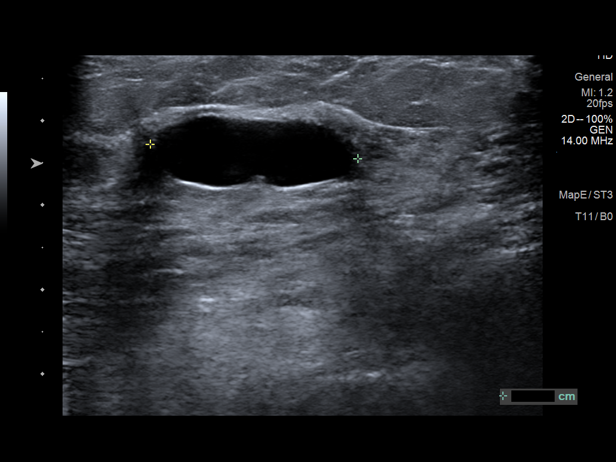
[im 12/14]
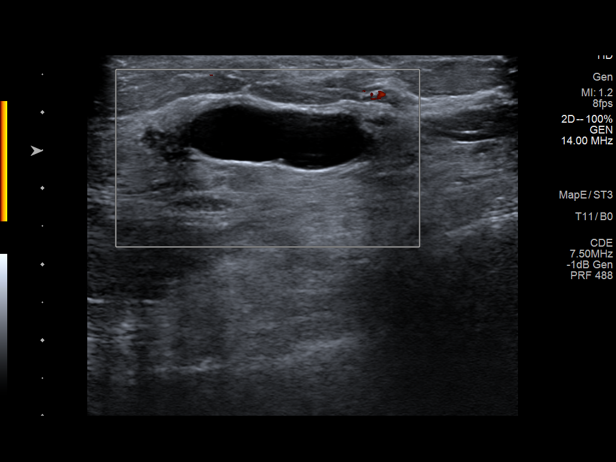
[im 13/14]
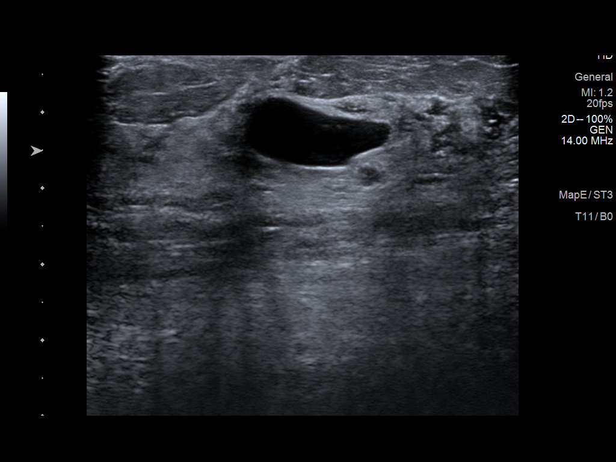
[im 14/14]
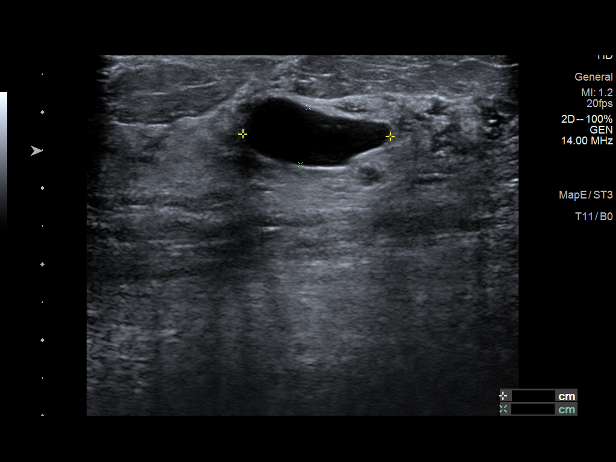

[14 of 14 positions shown; findings below may reference images not displayed]

ACR Breast Density Category c: The breast tissue is heterogeneously
dense, which may obscure small masses.
FINDINGS: Biopsy site marker within the LEFT breast is stable in position,
corresponding to the patient's known myoepithelioma. There are no
new dominant masses, suspicious calcifications or secondary signs of
malignancy within either breast.

Targeted ultrasound is performed, evaluating the subareolar LEFT
breast, showing no new solid or cystic mass. No intraductal mass or
vascularity. Again demonstrated is the hypoechoic mass at the 2
o'clock axis, 1 cm from the nipple, measuring 1.1 x 0.5 x 0.9 cm,
stable to slightly smaller compared to the previous ultrasound of
[DATE], compatible with the biopsy-proven myoepithelioma.
IMPRESSION: 1. No evidence of malignancy within either breast.
2. No source for patient's bloody LEFT-sided nipple discharge is
identified today.
3. Stable, or slightly smaller, mass within the retroareolar LEFT
breast, 2 o'clock axis, measuring 1.1 cm, corresponding to the
biopsy-proven myoepithelioma.

RECOMMENDATION:
1. Referral to breast surgeon for possible central duct excision as
there is no source identified as yet for patient's suspicious
LEFT-sided nipple discharge (now bloody), neither on today's exam or
on the recent breast MRI report of [DATE].
2. Per treatment plan for patient's biopsy-proven myoepithelioma
within the retroareolar LEFT breast, 2 o'clock axis. Recommendation
by Dr. FELNER on a biopsy report of [DATE] reads as follows:
"Myoepithelioma is a very rare lesion. The pathologist assured me
that there are no signs of malignancy. However, given the rare
nature of this mass, it was decided to follow biopsy mass in the
LEFT breast at 2 o'clock in 6 months with ultrasound to ensure
stability. However, if patient goes to surgery for the LEFT nipple
discharge, I would recommend localizing and removing the mass at
that time since it is in close proximity to the retroareolar
region." As above, the follow-up ultrasound recommended above was
performed today and shows no significant change in size of the
myoepithelioma.

I have discussed these findings and recommendations with the patient
today. If applicable, a reminder letter will be sent to the patient
regarding the next appointment.

BI-RADS CATEGORY  2: Benign. However, there remains suspicious
LEFT-sided bloody nipple discharge for which referral to breast
surgeon is recommended for possible central duct excision.

ADDENDUM:
Incidental note is made of a benign simple cyst in the LEFT breast
at the 3 o'clock axis, 8 cm from the nipple, measuring 2.5 cm.

*** End of Addendum ***
ACR Breast Density Category c: The breast tissue is heterogeneously
dense, which may obscure small masses.
FINDINGS: Biopsy site marker within the LEFT breast is stable in position,
corresponding to the patient's known myoepithelioma. There are no
new dominant masses, suspicious calcifications or secondary signs of
malignancy within either breast.

Targeted ultrasound is performed, evaluating the subareolar LEFT
breast, showing no new solid or cystic mass. No intraductal mass or
vascularity. Again demonstrated is the hypoechoic mass at the 2
o'clock axis, 1 cm from the nipple, measuring 1.1 x 0.5 x 0.9 cm,
stable to slightly smaller compared to the previous ultrasound of
[DATE], compatible with the biopsy-proven myoepithelioma.
IMPRESSION: 1. No evidence of malignancy within either breast.
2. No source for patient's bloody LEFT-sided nipple discharge is
identified today.
3. Stable, or slightly smaller, mass within the retroareolar LEFT
breast, 2 o'clock axis, measuring 1.1 cm, corresponding to the
biopsy-proven myoepithelioma.

RECOMMENDATION:
1. Referral to breast surgeon for possible central duct excision as
there is no source identified as yet for patient's suspicious
LEFT-sided nipple discharge (now bloody), neither on today's exam or
on the recent breast MRI report of [DATE].
2. Per treatment plan for patient's biopsy-proven myoepithelioma
within the retroareolar LEFT breast, 2 o'clock axis. Recommendation
by Dr. FELNER on a biopsy report of [DATE] reads as follows:
"Myoepithelioma is a very rare lesion. The pathologist assured me
that there are no signs of malignancy. However, given the rare
nature of this mass, it was decided to follow biopsy mass in the
LEFT breast at 2 o'clock in 6 months with ultrasound to ensure
stability. However, if patient goes to surgery for the LEFT nipple
discharge, I would recommend localizing and removing the mass at
that time since it is in close proximity to the retroareolar
region." As above, the follow-up ultrasound recommended above was
performed today and shows no significant change in size of the
myoepithelioma.

I have discussed these findings and recommendations with the patient
today. If applicable, a reminder letter will be sent to the patient
regarding the next appointment.

BI-RADS CATEGORY  2: Benign. However, there remains suspicious
LEFT-sided bloody nipple discharge for which referral to breast
surgeon is recommended for possible central duct excision.

## 2020-07-18 ENCOUNTER — Telehealth: Payer: Self-pay | Admitting: Family Medicine

## 2020-07-18 DIAGNOSIS — N6452 Nipple discharge: Secondary | ICD-10-CM

## 2020-07-18 DIAGNOSIS — D369 Benign neoplasm, unspecified site: Secondary | ICD-10-CM

## 2020-07-18 NOTE — Telephone Encounter (Signed)
Called and spoke with Ms Cephus Richer Has only had left sided bloody nipple discharge twice, otherwise is clear most of the time. Minimal breast pain. The results of her recent mammogram and ultrasound had been discussed with her at the breast center, and she would like to proceed with referral to surgeon per radiology recommendation to discuss potential duct excision. All questions and concerns addressed, referral placed.  Yehuda Savannah MD

## 2020-09-02 ENCOUNTER — Other Ambulatory Visit: Payer: Self-pay | Admitting: Family Medicine

## 2020-12-09 ENCOUNTER — Other Ambulatory Visit: Payer: Self-pay | Admitting: Family Medicine

## 2020-12-09 DIAGNOSIS — E119 Type 2 diabetes mellitus without complications: Secondary | ICD-10-CM

## 2021-02-05 ENCOUNTER — Other Ambulatory Visit: Payer: Self-pay

## 2021-02-05 ENCOUNTER — Ambulatory Visit: Payer: 59 | Admitting: Family Medicine

## 2021-02-05 ENCOUNTER — Encounter: Payer: Self-pay | Admitting: Family Medicine

## 2021-02-05 VITALS — HR 76 | Ht 60.0 in | Wt 186.0 lb

## 2021-02-05 DIAGNOSIS — I10 Essential (primary) hypertension: Secondary | ICD-10-CM | POA: Diagnosis not present

## 2021-02-05 DIAGNOSIS — N6452 Nipple discharge: Secondary | ICD-10-CM

## 2021-02-05 DIAGNOSIS — E119 Type 2 diabetes mellitus without complications: Secondary | ICD-10-CM | POA: Diagnosis not present

## 2021-02-05 LAB — POCT GLYCOSYLATED HEMOGLOBIN (HGB A1C): HbA1c, POC (controlled diabetic range): 10.2 % — AB (ref 0.0–7.0)

## 2021-02-05 MED ORDER — EMPAGLIFLOZIN 25 MG PO TABS: 25.0000 mg | ORAL_TABLET | Freq: Every day | ORAL | 0 refills | Status: DC

## 2021-02-05 MED ORDER — CHLORTHALIDONE 25 MG PO TABS: 25.0000 mg | ORAL_TABLET | Freq: Every day | ORAL | 0 refills | Status: DC

## 2021-02-05 NOTE — Assessment & Plan Note (Signed)
-  prior MRI noted to result in no findings suspicious for malignancy, biopsy noted to be myoepithelioma  -discharge seems to be improving in quality but still persists, next mammogram due April 2023 -placed general surgery referral for excision and possible localized lumpectomy  -return precautions discussed -follow up in 1 month, place order for mammogram at this time

## 2021-02-05 NOTE — Patient Instructions (Signed)
It was great seeing you today!  Today we discussed your nipple discharge, I have placed a referral to general surgery. Please contact our office if you do not hear from them within 2 weeks. You are due for another mammogram in April 2023.   Please follow up at your next scheduled appointment in 1 month, if anything arises between now and then, please don't hesitate to contact our office.   Thank you for allowing [place] to be a part of your medical care!  Thank you, Dr. Larae Grooms

## 2021-02-05 NOTE — Progress Notes (Signed)
° ° °  SUBJECTIVE:   CHIEF COMPLAINT / HPI:   Left nipple discharge  Noticed discharge from left breast that she has noticed for more than a year. Discharge appears clear, she noticed this after she got her COVID vaccine. She notices this discharge every morning as soon as she gets up. Had one episode of bloody discharge a few months ago but none since then. Endorses pinpoint pain that she rates 3/10 that occurs intermittently then goes away. The pain is never as bad that she has to take medication for it. Works at [employer], some which are heavy. Denies any injury or trauma. Mother passed away from breast cancer at the age of 50 years old. Denies any personal history of breast cancer. She had a mammogram in January 2022 with surgical referral but was not able to follow through due to financial reasons.   OBJECTIVE:   Pulse 76    Ht 5' (1.524 m)    Wt 186 lb (84.4 kg)    LMP 01/30/2019    SpO2 100%    BMI 36.33 kg/m   General: Patient well-appearing, in no acute distress. HEENT: non-tender thyroid, no evidence of cervical LAD  CV: RRR, no murmurs or gallops auscultated  Resp: CTAB Breast exam: 0.5 movable subcutaneous nodule noted in the lower medial quadrant of the left breast, axillary LAD noted, hyperpigmentation noted along acerola bilaterally without presence of discharge, no evidence of trauma or injury  Psych: mood appropriate   Breast exam performed in the presence of chaperone.   ASSESSMENT/PLAN:   Discharge from breast -prior MRI noted to result in no findings suspicious for malignancy, biopsy noted to be myoepithelioma  -discharge seems to be improving in quality but still persists, next mammogram due April 2023 -placed general surgery referral for excision and possible localized lumpectomy  -return precautions discussed -follow up in 1 month, place order for mammogram at this time  Type 2 diabetes mellitus without complication, without long-term current use of  insulin (HCC) -A1c 10.2, stable from last visit but not at goal of <7 -continue current regimen  -follow up with PCP   -PHQ-9 score of 9 with negative question 9 reviewed.   Donney Dice, [place]

## 2021-02-05 NOTE — Assessment & Plan Note (Signed)
-  A1c 10.2, stable from last visit but not at goal of <7 -continue current regimen  -follow up with PCP

## 2021-03-02 ENCOUNTER — Ambulatory Visit: Payer: Self-pay | Admitting: Surgery

## 2021-03-02 DIAGNOSIS — N6342 Unspecified lump in left breast, subareolar: Secondary | ICD-10-CM

## 2021-03-02 DIAGNOSIS — N6452 Nipple discharge: Secondary | ICD-10-CM | POA: Diagnosis not present

## 2021-03-02 NOTE — H&P (Signed)
Subjective   Chief Complaint: Breast Discharge     History of Present Illness: Valerie Bauer is a 51 y.o. female who is seen today as an office consultation at the request of Dr. Gwendlyn Deutscher for evaluation of Breast Discharge .   This is a 51 year old female who was initially seen 1 year ago for left nipple discharge.  The patient has a family history of breast cancer in her mother who passed away from breast cancer.  She presents with a 1 year history of intermittent discharge from the left nipple.  She had 1 occasion where the drainage appeared bloody.  She had a thorough work-up including mammogram and ultrasound.  The ultrasound showed normal caliber ducts in the subareolar location.  However in the retroareolar breast at 2:00 1 cm from the nipple there is an oval mass measuring 1.1 x 0.5 x 0.9 centimeters.  The axilla was negative.  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  MRI was otherwise negative.  We had recommended a radioactive seed localized lumpectomy last year but she declined to schedule surgery due to financial reasons.  The discharge has persisted.  Occasionally is heavy.  She had a follow-up mammogram and ultrasound in May 2022 that showed no significant changes.  The myoepithelioma persists.   Review of Systems: A complete review of systems was obtained from the patient.  I have reviewed this information and discussed as appropriate with the patient.  See HPI as well for other ROS.  Review of Systems  Constitutional: Negative.   HENT: Negative.   Eyes: Negative.   Respiratory: Negative.   Cardiovascular: Negative.   Gastrointestinal: Negative.   Genitourinary: Negative.   Musculoskeletal: Negative.   Skin: Negative.   Neurological: Negative.   Endo/Heme/Allergies: Negative.   Psychiatric/Behavioral: Negative.       Medical History: Past Medical History:  Diagnosis Date   Hyperlipidemia    Hypertension    Thyroid disease     Patient Active Problem List   Diagnosis   Nipple discharge in female   Subareolar mass of left breast   Discharge from breast   Hypothyroidism (acquired), unspecified   Myoepithelioma   Obesity (BMI 30-39.9), unspecified   Primary hypertension   Type 2 diabetes mellitus without complication, without long-term current use of insulin (CMS-HCC)    Past Surgical History:  Procedure Laterality Date   HYSTERECTOMY       Not on File  Current Outpatient Medications on File Prior to Visit  Medication Sig Dispense Refill   atorvastatin (LIPITOR) 40 MG tablet Take 1 tablet by mouth once daily     dulaglutide (TRULICITY) 1.5 ZO/1.0 mL subcutaneous pen injector Inject subcutaneously     levothyroxine (SYNTHROID) 125 MCG tablet Take by mouth     valACYclovir (VALTREX) 500 MG tablet Take by mouth     chlorthalidone 25 MG tablet Take 25 mg by mouth once daily     JARDIANCE 25 mg tablet Take 25 mg by mouth once daily APPOINTMENT REQUIRED FOR FUTURE REFILLS     losartan (COZAAR) 50 MG tablet Take 50 mg by mouth once daily     metFORMIN (GLUCOPHAGE) 1000 MG tablet Take by mouth     No current facility-administered medications on file prior to visit.    Family History  Problem Relation Age of Onset   Breast cancer Mother    Coronary Artery Disease (Blocked arteries around heart) Father    Hyperlipidemia (Elevated cholesterol) Father    High blood pressure (Hypertension) Father  Social History   Tobacco Use  Smoking Status Never  Smokeless Tobacco Never     Social History   Socioeconomic History   Marital status: Single  Tobacco Use   Smoking status: Never   Smokeless tobacco: Never  Substance and Sexual Activity   Alcohol use: Never   Drug use: Never    Objective:    Vitals:   03/02/21 0947  BP: 120/82  Pulse: 73  Temp: 36.9 C (98.5 F)  SpO2: 99%  Weight: 82.4 kg (181 lb 9.6 oz)  Height: 152.4 cm (5')    Body mass index is 35.47 kg/m.  Physical Exam   Constitutional:  WDWN in NAD,  conversant, no obvious deformities; lying in bed comfortably Eyes:  Pupils equal, round; sclera anicteric; moist conjunctiva; no lid lag HENT:  Oral mucosa moist; good dentition  Neck:  No masses palpated, trachea midline; no thyromegaly Lungs:  CTA bilaterally; normal respiratory effort Breasts:  Symmetric, no palpable lymphadenopathy on either side.  The left nipple shows easily expressed clear discharge from the center of the nipple.  There are some indistinct palpable retroareolar masses that are quite small in the upper outer quadrant CV:  Regular rate and rhythm; no murmurs; extremities well-perfused with no edema Abd:  +bowel sounds, soft, non-tender, no palpable organomegaly; no palpable hernias Musc:  Unable to assess gait; no apparent clubbing or cyanosis in extremities Lymphatic:  No palpable cervical or axillary lymphadenopathy Skin:  Warm, dry; no sign of jaundice Psychiatric - alert and oriented x 4; calm mood and affect   Labs, Imaging and Diagnostic Testing: CLINICAL DATA:  Bloody nipple discharge. History of suspicious LEFT-sided spontaneous clear nipple discharge for greater than a year. However patient states that the LEFT-sided nipple discharge is now bloody.   Ultrasound-guided biopsy of a retroareolar LEFT breast mass (2 o'clock axis) on 01/18/2020 revealed benign myoepithelioma.   EXAM: DIGITAL DIAGNOSTIC BILATERAL MAMMOGRAM WITH TOMOSYNTHESIS AND CAD; ULTRASOUND LEFT BREAST LIMITED   TECHNIQUE: Bilateral digital diagnostic mammography and breast tomosynthesis was performed. The images were evaluated with computer-aided detection.; Targeted ultrasound examination of the left breast was performed   COMPARISON:  Previous exams including ultrasound-guided LEFT breast biopsy dated 01/18/2020.   ACR Breast Density Category c: The breast tissue is heterogeneously dense, which may obscure small masses.   FINDINGS: Biopsy site marker within the LEFT breast is  stable in position, corresponding to the patient's known myoepithelioma. There are no new dominant masses, suspicious calcifications or secondary signs of malignancy within either breast.   Targeted ultrasound is performed, evaluating the subareolar LEFT breast, showing no new solid or cystic mass. No intraductal mass or vascularity. Again demonstrated is the hypoechoic mass at the 2 o'clock axis, 1 cm from the nipple, measuring 1.1 x 0.5 x 0.9 cm, stable to slightly smaller compared to the previous ultrasound of 01/04/2020, compatible with the biopsy-proven myoepithelioma.   IMPRESSION: 1. No evidence of malignancy within either breast. 2. No source for patient's bloody LEFT-sided nipple discharge is identified today. 3. Stable, or slightly smaller, mass within the retroareolar LEFT breast, 2 o'clock axis, measuring 1.1 cm, corresponding to the biopsy-proven myoepithelioma.   RECOMMENDATION: 1. Referral to breast surgeon for possible central duct excision as there is no source identified as yet for patient's suspicious LEFT-sided nipple discharge (now bloody), neither on today's exam or on the recent breast MRI report of 02/25/2020. 2. Per treatment plan for patient's biopsy-proven myoepithelioma within the retroareolar LEFT breast, 2 o'clock axis. Recommendation by  Dr. Jimmye Norman on a biopsy report of 01/18/2020 reads as follows: "Myoepithelioma is a very rare lesion. The pathologist assured me that there are no signs of malignancy. However, given the rare nature of this mass, it was decided to follow biopsy mass in the LEFT breast at 2 o'clock in 6 months with ultrasound to ensure stability. However, if patient goes to surgery for the LEFT nipple discharge, I would recommend localizing and removing the mass at that time since it is in close proximity to the retroareolar region." As above, the follow-up ultrasound recommended above was performed today and shows no significant  change in size of the myoepithelioma.   I have discussed these findings and recommendations with the patient today. If applicable, a reminder letter will be sent to the patient regarding the next appointment.   BI-RADS CATEGORY  2: Benign. However, there remains suspicious LEFT-sided bloody nipple discharge for which referral to breast surgeon is recommended for possible central duct excision.   Electronically Signed: By: Franki Cabot M.D. On: 07/14/2020 13:24  CLINICAL DATA:  51 year old female with spontaneous clear LEFT nipple discharge for 1 year. UPPER-OUTER RETROAREOLAR LEFT breast mass biopsy on 01/18/2020 demonstrating a myoepithelioma.   LABS:  None performed today   EXAM: BILATERAL BREAST MRI WITH AND WITHOUT CONTRAST   TECHNIQUE: Multiplanar, multisequence MR images of both breasts were obtained prior to and following the intravenous administration of 8 ml of Gadavist   Three-dimensional MR images were rendered by post-processing of the original MR data on an independent workstation. The three-dimensional MR images were interpreted, and findings are reported in the following complete MRI report for this study. Three dimensional images were evaluated at the independent interpreting workstation using the DynaCAD thin client.   COMPARISON:  Prior mammograms and ultrasounds   FINDINGS: Breast composition: d. Extreme fibroglandular tissue.   Background parenchymal enhancement: Moderate   Right breast: Numerous similar appearing scattered foci within the RIGHT breast identified. No suspicious mass or enhancement noted.   Left breast: Numerous similar appearing scattered foci within the LEFT breast identified. No suspicious mass or enhancement noted. Biopsy clip artifact within the UPPER-OUTER RETROAREOLAR LEFT breast is identified.   Lymph nodes: No abnormal appearing lymph nodes.   Ancillary findings:  None.   IMPRESSION: 1. No findings suspicious for  breast malignancy. 2. Numerous similar appearing scattered foci within both breast. Given bilateral similarity and stable mammographic appearance from 2010, no further imaging follow-up is recommended. 3. Biopsy clip artifact within the UPPER OUTER RETROAREOLAR LEFT breast at the site of biopsy-proven myoepithelioma.   RECOMMENDATION: 1. Surgical/clinical follow-up of LEFT nipple discharge. 2. Bilateral screening mammogram in April 2022 to resume annual mammogram schedule.   BI-RADS CATEGORY  2: Benign.     Electronically Signed   By: Margarette Canada M.D.   On: 02/28/2020 12:35 Assessment and Plan:  Diagnoses and all orders for this visit:  Nipple discharge in female  Subareolar mass of left breast     Recommend left radioactive seed localized lumpectomy to excise the persistent breast mass.  We will also perform a left nipple duct excision.The surgical procedure has been discussed with the patient.  Potential risks, benefits, alternative treatments, and expected outcomes have been explained.  All of the patient's questions at this time have been answered.  The likelihood of reaching the patient's treatment goal is good.  The patient understand the proposed surgical procedure and wishes to proceed.   No follow-ups on file.  Jaliza Seifried Jearld Adjutant, MD  03/02/2021 11:50 AM

## 2021-03-03 ENCOUNTER — Other Ambulatory Visit: Payer: Self-pay | Admitting: Family Medicine

## 2021-03-08 ENCOUNTER — Other Ambulatory Visit: Payer: Self-pay | Admitting: Family Medicine

## 2021-03-08 DIAGNOSIS — E119 Type 2 diabetes mellitus without complications: Secondary | ICD-10-CM

## 2021-03-12 ENCOUNTER — Other Ambulatory Visit: Payer: Self-pay | Admitting: Surgery

## 2021-03-12 DIAGNOSIS — N6342 Unspecified lump in left breast, subareolar: Secondary | ICD-10-CM

## 2021-03-30 ENCOUNTER — Encounter: Payer: Self-pay | Admitting: Family Medicine

## 2021-03-30 ENCOUNTER — Ambulatory Visit (INDEPENDENT_AMBULATORY_CARE_PROVIDER_SITE_OTHER): Payer: BC Managed Care – PPO | Admitting: Family Medicine

## 2021-03-30 ENCOUNTER — Other Ambulatory Visit: Payer: Self-pay | Admitting: Family Medicine

## 2021-03-30 ENCOUNTER — Other Ambulatory Visit: Payer: Self-pay

## 2021-03-30 VITALS — BP 142/92 | HR 79 | Ht 60.0 in | Wt 181.0 lb

## 2021-03-30 DIAGNOSIS — E039 Hypothyroidism, unspecified: Secondary | ICD-10-CM | POA: Diagnosis not present

## 2021-03-30 DIAGNOSIS — E119 Type 2 diabetes mellitus without complications: Secondary | ICD-10-CM

## 2021-03-30 DIAGNOSIS — I10 Essential (primary) hypertension: Secondary | ICD-10-CM

## 2021-03-30 DIAGNOSIS — Z0000 Encounter for general adult medical examination without abnormal findings: Secondary | ICD-10-CM

## 2021-03-30 DIAGNOSIS — Z01818 Encounter for other preprocedural examination: Secondary | ICD-10-CM

## 2021-03-30 DIAGNOSIS — E1169 Type 2 diabetes mellitus with other specified complication: Secondary | ICD-10-CM

## 2021-03-30 DIAGNOSIS — Z1211 Encounter for screening for malignant neoplasm of colon: Secondary | ICD-10-CM

## 2021-03-30 DIAGNOSIS — E785 Hyperlipidemia, unspecified: Secondary | ICD-10-CM

## 2021-03-30 MED ORDER — CHLORTHALIDONE 15 MG PO TABS: 15.0000 mg | ORAL_TABLET | Freq: Every day | ORAL | 3 refills | Status: DC

## 2021-03-30 MED ORDER — TRULICITY 1.5 MG/0.5ML ~~LOC~~ SOAJ: 1.5000 mg | SUBCUTANEOUS | 2 refills | Status: DC

## 2021-03-30 NOTE — Assessment & Plan Note (Signed)
Check TSH today, continue levothyroxine.

## 2021-03-30 NOTE — Assessment & Plan Note (Signed)
Chronic, uncontrolled. Patient was out of trulicity and strips for glucometer. Rxs sent this week. Follow up after surgery later this month to recheck A1c. Current Regimen: metformin mg BID, jardiance 25 mg qd, trulicity 0.5 mg qweekly CBGs: unsure, no strips, will send RX Last A1c: 10.4% 12/22 Denies polyuria, polydipsia, hypoglycemia  Last Eye Exam: reminded patient to do this year Statin: atorvastatin ACE/ARB: losartan

## 2021-03-30 NOTE — Assessment & Plan Note (Signed)
Chronic, not controlled. Patient has been out of chlothalidone, rx sent. - Continue losartan 50 mg qd, restart chlorthalidone 12.5 mg qd - Follow up 2-4 weeks (after surgery) - Will obtain CMP today

## 2021-03-30 NOTE — Assessment & Plan Note (Signed)
Patient is low risk for a low risk surgery.  The patient has been medically optimized for this procedure. In addition, I make the following recommendations for further evaluation and medication adjustments prior to their  planned procedure: - Stop jardiance 3-4 days prior to surgery - Hold metformin day of surgery and trulicity (if surgery day is day of week it is injected) - Hold losartan and chlorthalidone on day of surgery - Patient may take amlodipine day of surgery - Continue taking atorvastatin on day of surgery - Continue taking levothyroxine on day of surgery

## 2021-03-30 NOTE — Patient Instructions (Addendum)
It was a pleasure to see you today!  We will get some labs today.  If they are abnormal or we need to do something about them, I will call you.  If they are normal, I will send you a message on MyChart (if it is active) or a letter in the mail.  If you don't hear from [place] in 2 weeks, please call the office  [phone]. Please check your blood glucose in the morning, goal is to be between 90 and 180. If higher or lower than that, please follow up in our office For blood pressure: if after you start chlorthalidone, take your blood pressure at the same time every day for about a week. If your blood pressure is still above 130/80, please return for evaluation. Follow up in 2-3 weeks after your surgery to check in on diabetes and high blood pressure   Be Well,  Dr. Chauncey Reading

## 2021-03-30 NOTE — Assessment & Plan Note (Signed)
Referral to GI for colonoscopy to be done this year, age 51. No fam ho CRC. Patient does not need pap smears due to hysterectomy in 2020.

## 2021-03-30 NOTE — Progress Notes (Signed)
Patient Name: Valerie Bauer Date of Birth: 1970/09/18 Date of Visit: 03/30/21 PCP: Gladys Damme, MD  Chief Complaint: preoperative evaluation Surgeon:  Georgette Dover Surgery: L breast lumpectomy Date of Procedure: 04/11/21  Subjective: Valerie Bauer is a pleasant 51 y.o. with medical history significant for DM2 presenting today for optimization for lumpectomy.   The patient can walk up 2 flights of stairs without difficulty.   Prior surgeries: partial hysterectomy December 2020 (fibroids) with removal of cervix and salpingectomy Difficulty with surgical procedures in the past: none History of difficulty with anesthesia: none History of venous thromboembolic disease: none History of easy bleeding with procedures: none Family history of venous thromboembolic disease: none Family history of bleeding diathesis: none  DM2: Will check A1c today. Last A1c 10.2% in December 2022. Current regimen includes jardiance 25mg  qd, trulicity 1.5mg  qw, metformin 1000 mg BID. She reports her fasting BG's have been: .  HTN: BP not at goal today. Regimen includes losartan 50 mg qd, chlorthalidone 25 mg qd. Patient reports that she has been out of chlorthalidone for several months. Previously she had decreased to 12.5 mg (1/2 tab) as 25mg  makes her dizzy. Will check CMP today.  Hypothyroid: Patient has been on levothyroxine 167mcg daily. Last TSH 2.91 in February 2022. Will check TSH today.  HLD: Last lipid panel obtained in October 2021. Currently on atorvastatin 40 mg qd. Will recheck CMP and lipid panel today.   HC maintenance: recommend patient get up to date with screening for cervical and colorectal cancer. Patient no longer has cervix- pap smears no longer required (see op note from hysterectomy 2020). Will counsel on colonoscopy.   ROS:  ROS Denies chest pain, dyspnea on exertion, chronic cough, history of venous thromboembolism, family history of venous thromboembolism, personal or family  history of easy bleeding or bruising, or personal history or family history of difficulty with anesthesia.  I have reviewed the patient's medical, surgical, family, and social history as appropriate.   Vitals:   03/30/21 0847  BP: (!) 142/92  Pulse: 79   Filed Weights   03/30/21 0847  Weight: 181 lb (82.1 kg)   Nursing note and vitals reviewed GEN: age-appropriate, AAW, resting comfortably in chair, NAD, class II obesity Cardiac: Regular rate and rhythm. Normal S1/S2. No murmurs, rubs, or gallops appreciated. 2+ radial pulses. Lungs: Clear bilaterally to ascultation. No increased WOB, no accessory muscle usage. No w/r/r. Abdomen: Normoactive bowel sounds. No tenderness to deep or light palpation. No rebound or guarding.    Neuro: AOx3  Ext: no edema Psych: Pleasant and appropriatre  Preoperative physical exam Patient is low risk for a low risk surgery.  The patient has been medically optimized for this procedure. In addition, I make the following recommendations for further evaluation and medication adjustments prior to their  planned procedure: - Stop jardiance 3-4 days prior to surgery - Hold metformin day of surgery and trulicity (if surgery day is day of week it is injected) - Hold losartan and chlorthalidone on day of surgery - Patient may take amlodipine day of surgery - Continue taking atorvastatin on day of surgery - Continue taking levothyroxine on day of surgery  Type 2 diabetes mellitus with hyperglycemia Chronic, uncontrolled. Patient was out of trulicity and strips for glucometer. Rxs sent this week. Follow up after surgery later this month to recheck A1c. Current Regimen: metformin mg BID, jardiance 25 mg qd, trulicity 0.5 mg qweekly CBGs: unsure, no strips, will send RX Last A1c: 10.4% 12/22 Denies polyuria,  polydipsia, hypoglycemia  Last Eye Exam: reminded patient to do this year Statin: atorvastatin ACE/ARB: losartan  Primary Hypertension Chronic, not  controlled. Patient has been out of chlothalidone, rx sent. - Continue losartan 50 mg qd and amlodipine 10 mg, restart chlorthalidone 12.5 mg qd - Follow up 2-4 weeks (after surgery) - Will obtain CMP today  Hypothyroidism Check TSH today, continue levothyroxine.  HLD Recheck CMP, lipid panel today. Continue atorvastatin.  Healthcare maintenance  Referral to GI for colonoscopy to be done this year, age 79. No fam ho CRC.  Gladys Damme, MD [facility], PGY-3

## 2021-03-31 LAB — LIPID PANEL
Chol/HDL Ratio: 3.3 ratio (ref 0.0–4.4)
Cholesterol, Total: 139 mg/dL (ref 100–199)
HDL: 42 mg/dL (ref >39)
LDL Chol Calc (NIH): 83 mg/dL (ref 0–99)
Triglycerides: 68 mg/dL (ref 0–149)
VLDL Cholesterol Cal: 14 mg/dL (ref 5–40)

## 2021-03-31 LAB — COMPREHENSIVE METABOLIC PANEL
ALT: 15 IU/L (ref 0–32)
AST: 17 IU/L (ref 0–40)
Albumin/Globulin Ratio: 1.6 (ref 1.2–2.2)
Albumin: 4.1 g/dL (ref 3.8–4.8)
Alkaline Phosphatase: 58 IU/L (ref 44–121)
BUN/Creatinine Ratio: 22 (ref 9–23)
BUN: 15 mg/dL (ref 6–24)
Bilirubin Total: 0.3 mg/dL (ref 0.0–1.2)
CO2: 25 mmol/L (ref 20–29)
Calcium: 9.2 mg/dL (ref 8.7–10.2)
Chloride: 97 mmol/L (ref 96–106)
Creatinine, Ser: 0.69 mg/dL (ref 0.57–1.00)
Globulin, Total: 2.6 g/dL (ref 1.5–4.5)
Glucose: 148 mg/dL — ABNORMAL HIGH (ref 70–99)
Potassium: 3.3 mmol/L — ABNORMAL LOW (ref 3.5–5.2)
Sodium: 136 mmol/L (ref 134–144)
Total Protein: 6.7 g/dL (ref 6.0–8.5)
eGFR: 106 mL/min/{1.73_m2} (ref >59)

## 2021-03-31 LAB — TSH: TSH: 7.6 u[IU]/mL — ABNORMAL HIGH (ref 0.450–4.500)

## 2021-04-01 ENCOUNTER — Encounter: Payer: Self-pay | Admitting: Family Medicine

## 2021-04-02 ENCOUNTER — Encounter: Payer: Self-pay | Admitting: Family Medicine

## 2021-04-02 ENCOUNTER — Encounter (HOSPITAL_BASED_OUTPATIENT_CLINIC_OR_DEPARTMENT_OTHER): Payer: Self-pay | Admitting: Surgery

## 2021-04-02 ENCOUNTER — Other Ambulatory Visit: Payer: Self-pay

## 2021-04-03 ENCOUNTER — Telehealth: Payer: Self-pay

## 2021-04-03 NOTE — Telephone Encounter (Signed)
Received fax from pharmacy, PA needed on Trulicity.  Clinical questions submitted via Cover My Meds.  Waiting on response, could take up to 72 hours.  Cover My Meds info: Key: [id number]  Talbot Grumbling, RN

## 2021-04-06 ENCOUNTER — Encounter: Payer: Self-pay | Admitting: Family Medicine

## 2021-04-06 ENCOUNTER — Encounter (HOSPITAL_BASED_OUTPATIENT_CLINIC_OR_DEPARTMENT_OTHER)
Admission: RE | Admit: 2021-04-06 | Discharge: 2021-04-06 | Disposition: A | Discharge status: Home / Self Care | Payer: BC Managed Care – PPO | Source: Ambulatory Visit | Attending: Surgery | Admitting: Surgery

## 2021-04-06 DIAGNOSIS — E119 Type 2 diabetes mellitus without complications: Secondary | ICD-10-CM

## 2021-04-06 DIAGNOSIS — Z01810 Encounter for preprocedural cardiovascular examination: Secondary | ICD-10-CM | POA: Insufficient documentation

## 2021-04-06 NOTE — Progress Notes (Signed)

## 2021-04-09 NOTE — Telephone Encounter (Signed)
Received approval on Trulicity through 7/74/1423.   Talbot Grumbling, RN

## 2021-04-10 ENCOUNTER — Ambulatory Visit
Admission: RE | Admit: 2021-04-10 | Discharge: 2021-04-10 | Disposition: A | Discharge status: Home / Self Care | Payer: BC Managed Care – PPO | Source: Ambulatory Visit | Attending: Surgery | Admitting: Surgery

## 2021-04-10 DIAGNOSIS — R928 Other abnormal and inconclusive findings on diagnostic imaging of breast: Secondary | ICD-10-CM | POA: Diagnosis not present

## 2021-04-10 DIAGNOSIS — N6342 Unspecified lump in left breast, subareolar: Secondary | ICD-10-CM

## 2021-04-10 IMAGING — MG MM PLC BREAST LOC DEV 1ST LESION INC MAMMO GUIDE*L*
8 series · 8 of 8 positions shown · non-contrast
Comparison: Previous exam(s).

CLINICAL DATA: Patient presents for seed localization prior to
excisional biopsy for LEFT nipple discharge and myoepithelioma.

EXAM:
MAMMOGRAPHIC GUIDED RADIOACTIVE SEED LOCALIZATION OF THE LEFT BREAST

[L CC (1 of 4)]
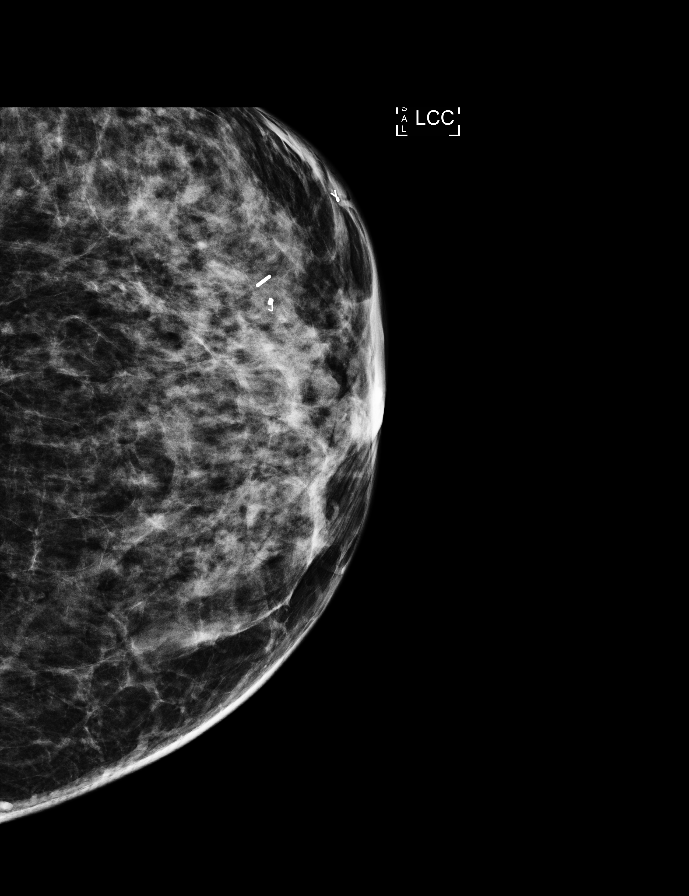

[L LM (1 of 4)]
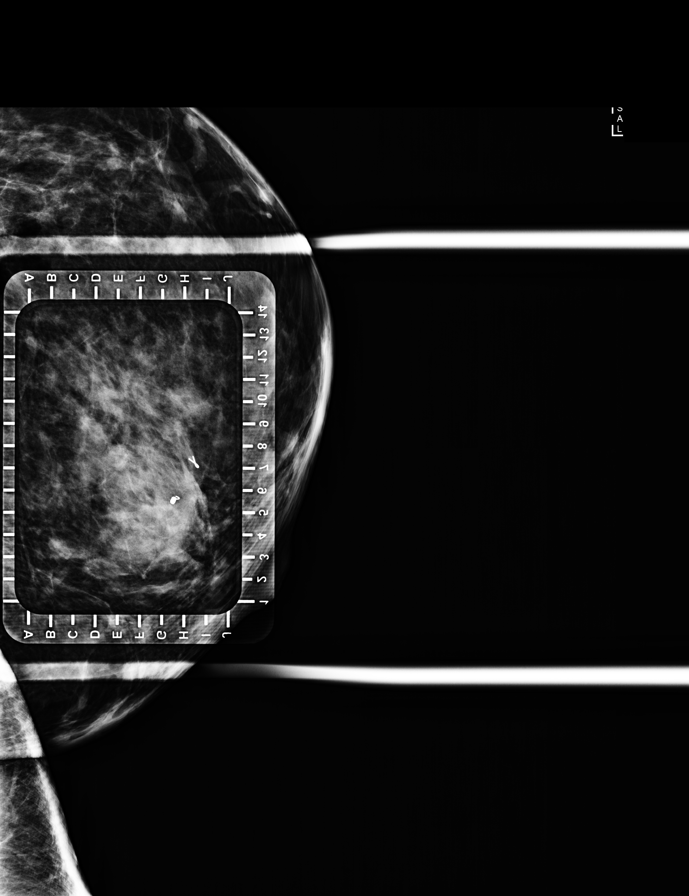

[L CC (2 of 4)]
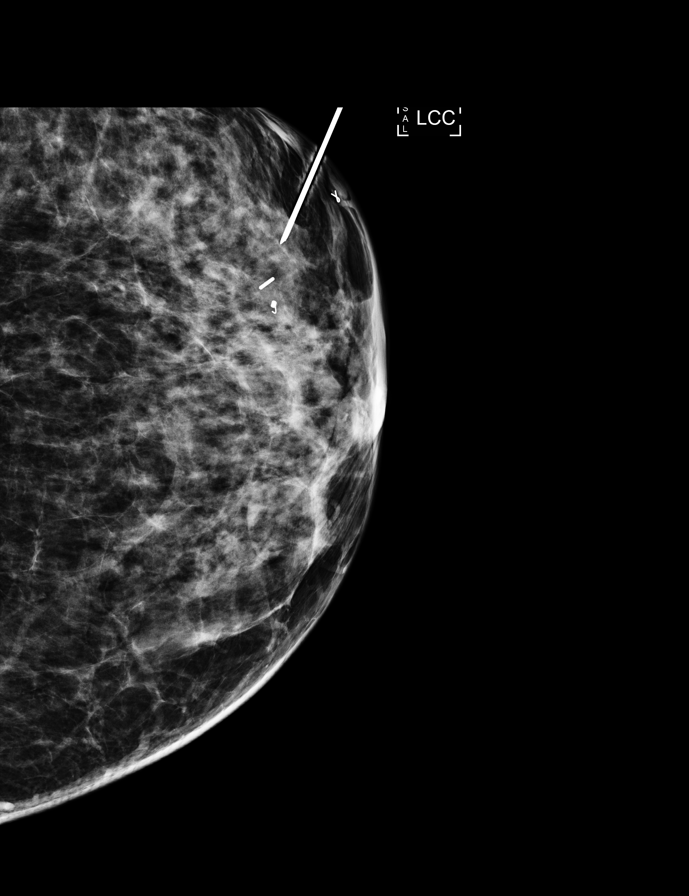

[L LM (2 of 4)]
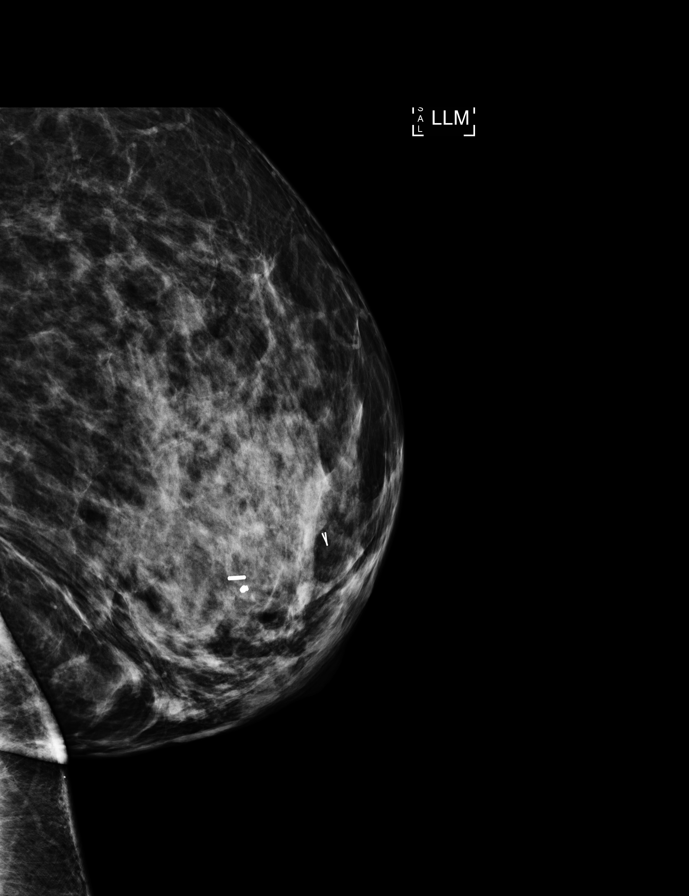

[L CC (3 of 4)]
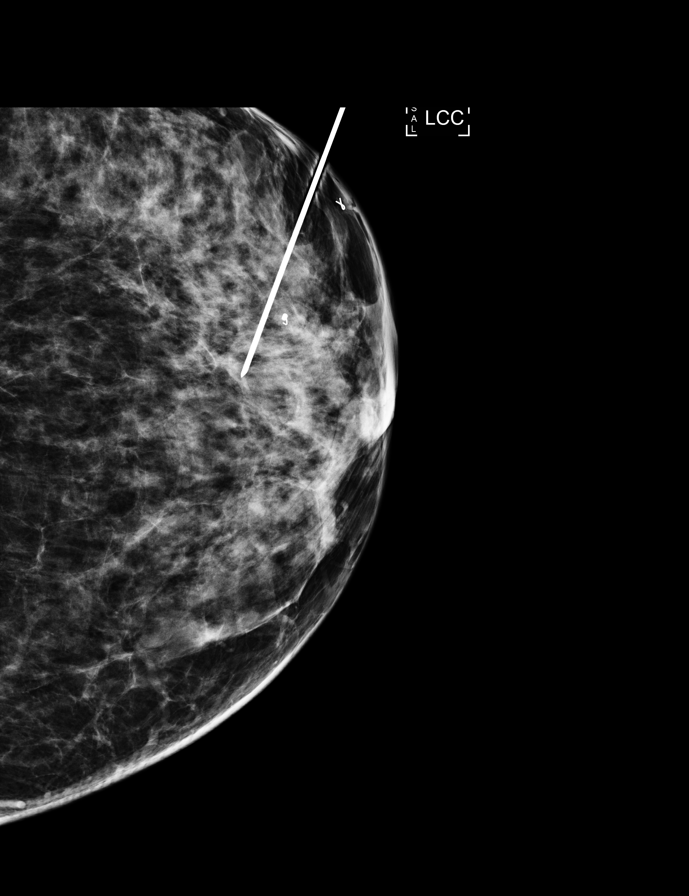

[L CC (4 of 4)]
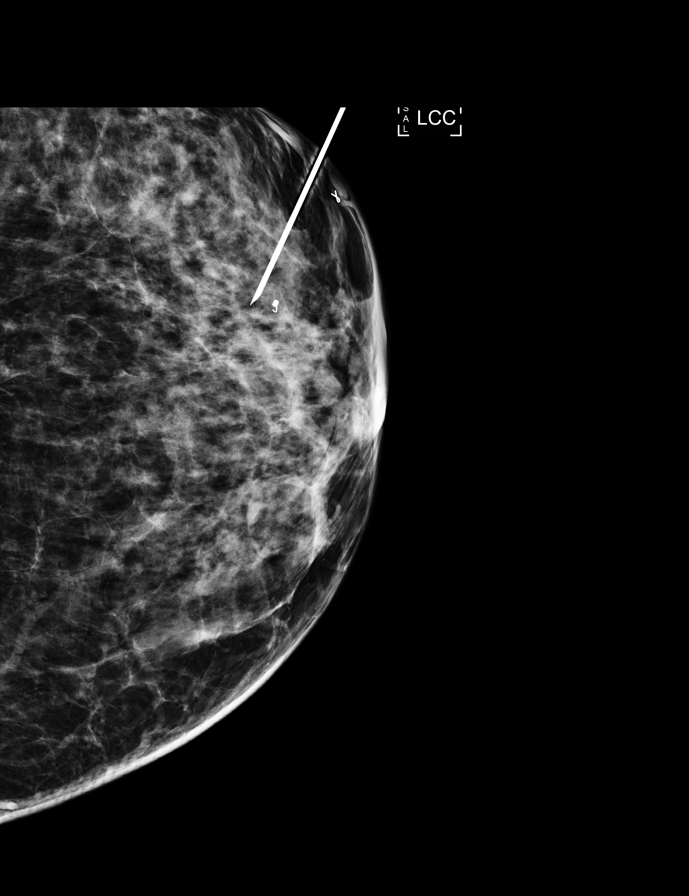

[L LM (3 of 4)]
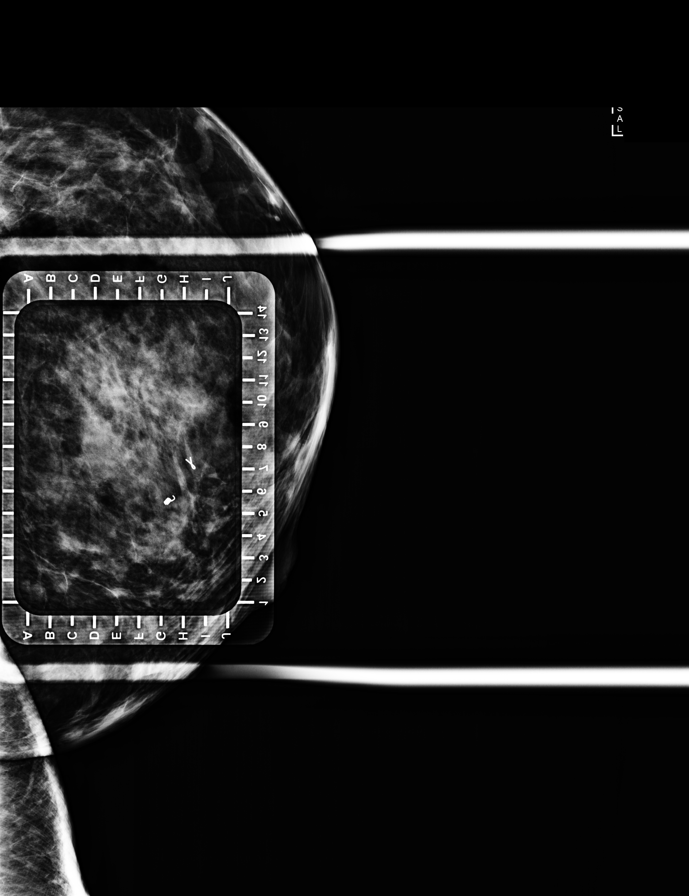

[L LM (4 of 4)]
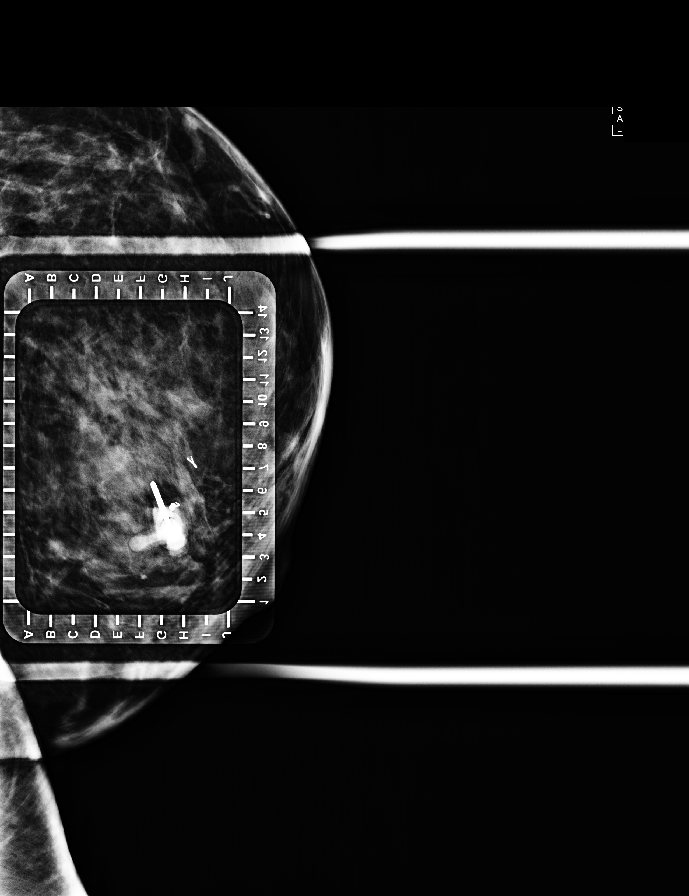

[8 of 8 positions shown; findings below may reference images not displayed]

FINDINGS: Patient presents for radioactive seed localization prior to
lumpectomy. I met with the patient and we discussed the procedure of
seed localization including benefits and alternatives. We discussed
the high likelihood of a successful procedure. We discussed the
risks of the procedure including infection, bleeding, tissue injury
and further surgery. We discussed the low dose of radioactivity
involved in the procedure. Informed, written consent was given.

The usual time-out protocol was performed immediately prior to the
procedure.

Using mammographic guidance, sterile technique, 1% lidocaine and an
[6P] radioactive seed, the coil shaped clip was localized using a
LATERAL approach. The follow-up mammogram images confirm the seed in
the expected location and were marked for Dr. QUIRIJN.

Follow-up survey of the patient confirms presence of the radioactive
seed.

Order number of [6P] seed:  [PHONE_NUMBER].

Total activity:  0.245 millicuries reference Date: [DATE]

The patient tolerated the procedure well and was released from the
[REDACTED]. She was given instructions regarding seed removal.
IMPRESSION: Radioactive seed localization left breast. No apparent
complications.

## 2021-04-11 ENCOUNTER — Ambulatory Visit
Admission: RE | Admit: 2021-04-11 | Discharge: 2021-04-11 | Disposition: A | Discharge status: Home / Self Care | Payer: BC Managed Care – PPO | Source: Ambulatory Visit | Attending: Surgery | Admitting: Surgery

## 2021-04-11 ENCOUNTER — Encounter (HOSPITAL_BASED_OUTPATIENT_CLINIC_OR_DEPARTMENT_OTHER)
Admission: RE | Disposition: A | Discharge status: Home / Self Care | Payer: Self-pay | Source: Home / Self Care | Attending: Surgery

## 2021-04-11 ENCOUNTER — Other Ambulatory Visit: Payer: Self-pay

## 2021-04-11 ENCOUNTER — Ambulatory Visit (HOSPITAL_BASED_OUTPATIENT_CLINIC_OR_DEPARTMENT_OTHER)
Admission: RE | Admit: 2021-04-11 | Discharge: 2021-04-11 | Disposition: A | Discharge status: Home / Self Care | Payer: BC Managed Care – PPO | Attending: Surgery | Admitting: Surgery

## 2021-04-11 ENCOUNTER — Ambulatory Visit (HOSPITAL_BASED_OUTPATIENT_CLINIC_OR_DEPARTMENT_OTHER): Payer: BC Managed Care – PPO | Admitting: Anesthesiology

## 2021-04-11 ENCOUNTER — Encounter (HOSPITAL_BASED_OUTPATIENT_CLINIC_OR_DEPARTMENT_OTHER): Payer: Self-pay | Admitting: Surgery

## 2021-04-11 DIAGNOSIS — R928 Other abnormal and inconclusive findings on diagnostic imaging of breast: Secondary | ICD-10-CM | POA: Diagnosis not present

## 2021-04-11 DIAGNOSIS — E119 Type 2 diabetes mellitus without complications: Secondary | ICD-10-CM | POA: Insufficient documentation

## 2021-04-11 DIAGNOSIS — D242 Benign neoplasm of left breast: Secondary | ICD-10-CM | POA: Insufficient documentation

## 2021-04-11 DIAGNOSIS — I10 Essential (primary) hypertension: Secondary | ICD-10-CM | POA: Diagnosis not present

## 2021-04-11 DIAGNOSIS — Z803 Family history of malignant neoplasm of breast: Secondary | ICD-10-CM | POA: Diagnosis not present

## 2021-04-11 DIAGNOSIS — N6012 Diffuse cystic mastopathy of left breast: Secondary | ICD-10-CM | POA: Diagnosis not present

## 2021-04-11 DIAGNOSIS — N6452 Nipple discharge: Secondary | ICD-10-CM | POA: Diagnosis not present

## 2021-04-11 DIAGNOSIS — N6321 Unspecified lump in the left breast, upper outer quadrant: Secondary | ICD-10-CM | POA: Diagnosis not present

## 2021-04-11 DIAGNOSIS — Z7984 Long term (current) use of oral hypoglycemic drugs: Secondary | ICD-10-CM | POA: Diagnosis not present

## 2021-04-11 DIAGNOSIS — C50912 Malignant neoplasm of unspecified site of left female breast: Secondary | ICD-10-CM | POA: Diagnosis not present

## 2021-04-11 DIAGNOSIS — N6320 Unspecified lump in the left breast, unspecified quadrant: Secondary | ICD-10-CM | POA: Diagnosis not present

## 2021-04-11 DIAGNOSIS — N6342 Unspecified lump in left breast, subareolar: Secondary | ICD-10-CM

## 2021-04-11 HISTORY — PX: BREAST DUCTAL SYSTEM EXCISION: SHX5242

## 2021-04-11 HISTORY — PX: BREAST LUMPECTOMY WITH RADIOACTIVE SEED LOCALIZATION: SHX6424

## 2021-04-11 LAB — GLUCOSE, CAPILLARY
Glucose-Capillary: 242 mg/dL — ABNORMAL HIGH (ref 70–99)
Glucose-Capillary: 203 mg/dL — ABNORMAL HIGH (ref 70–99)

## 2021-04-11 IMAGING — MG MM BREAST SURGICAL SPECIMEN
1 series · 2 of 2 positions shown · non-contrast
Comparison: Previous exam(s).

CLINICAL DATA: Specimen radiograph following excisional biopsy.

EXAM:
SPECIMEN RADIOGRAPH OF THE LEFT BREAST

[Series 1: L · left · 0.07mm/px · 2 of 2 slices shown]
[im 1/2]
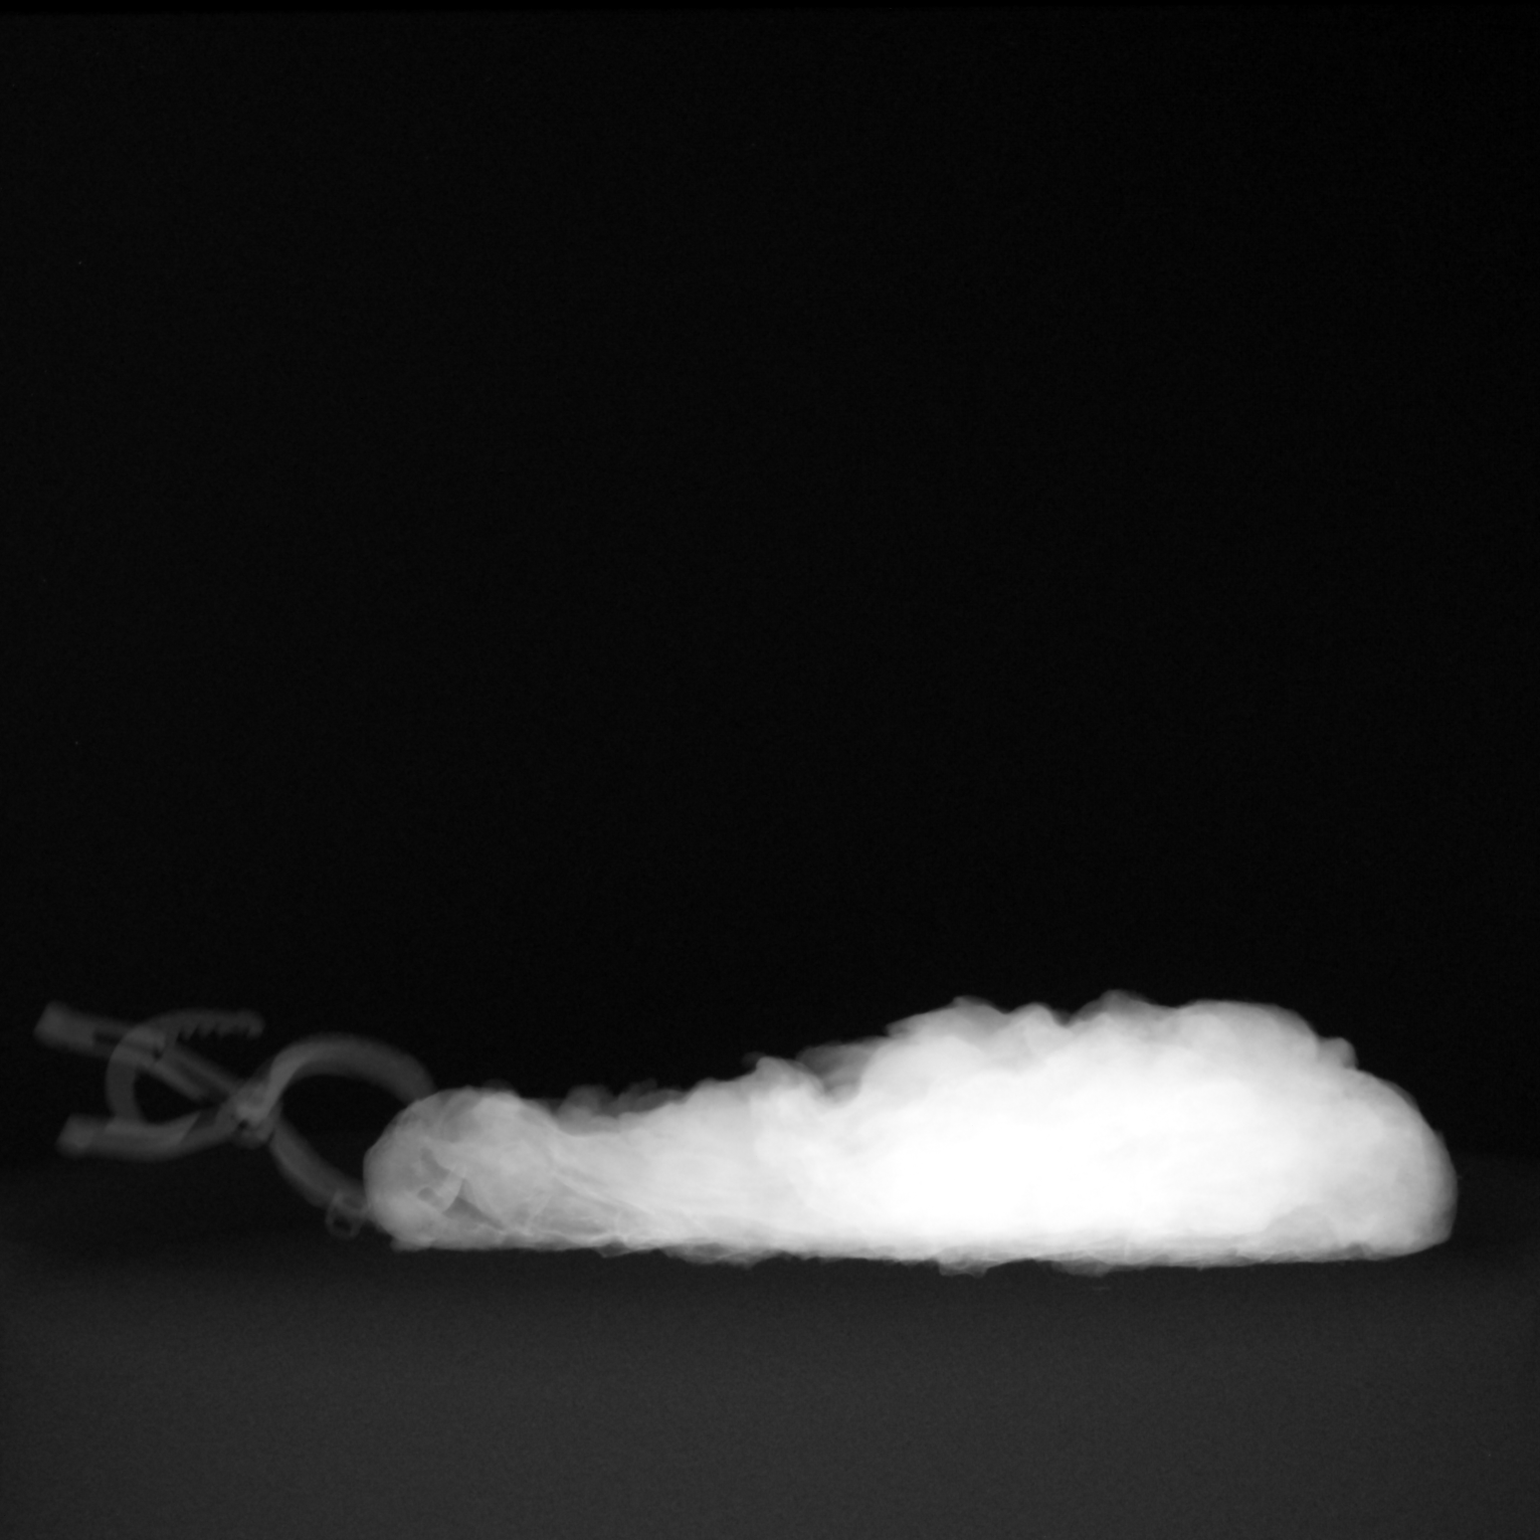
[im 2/2]
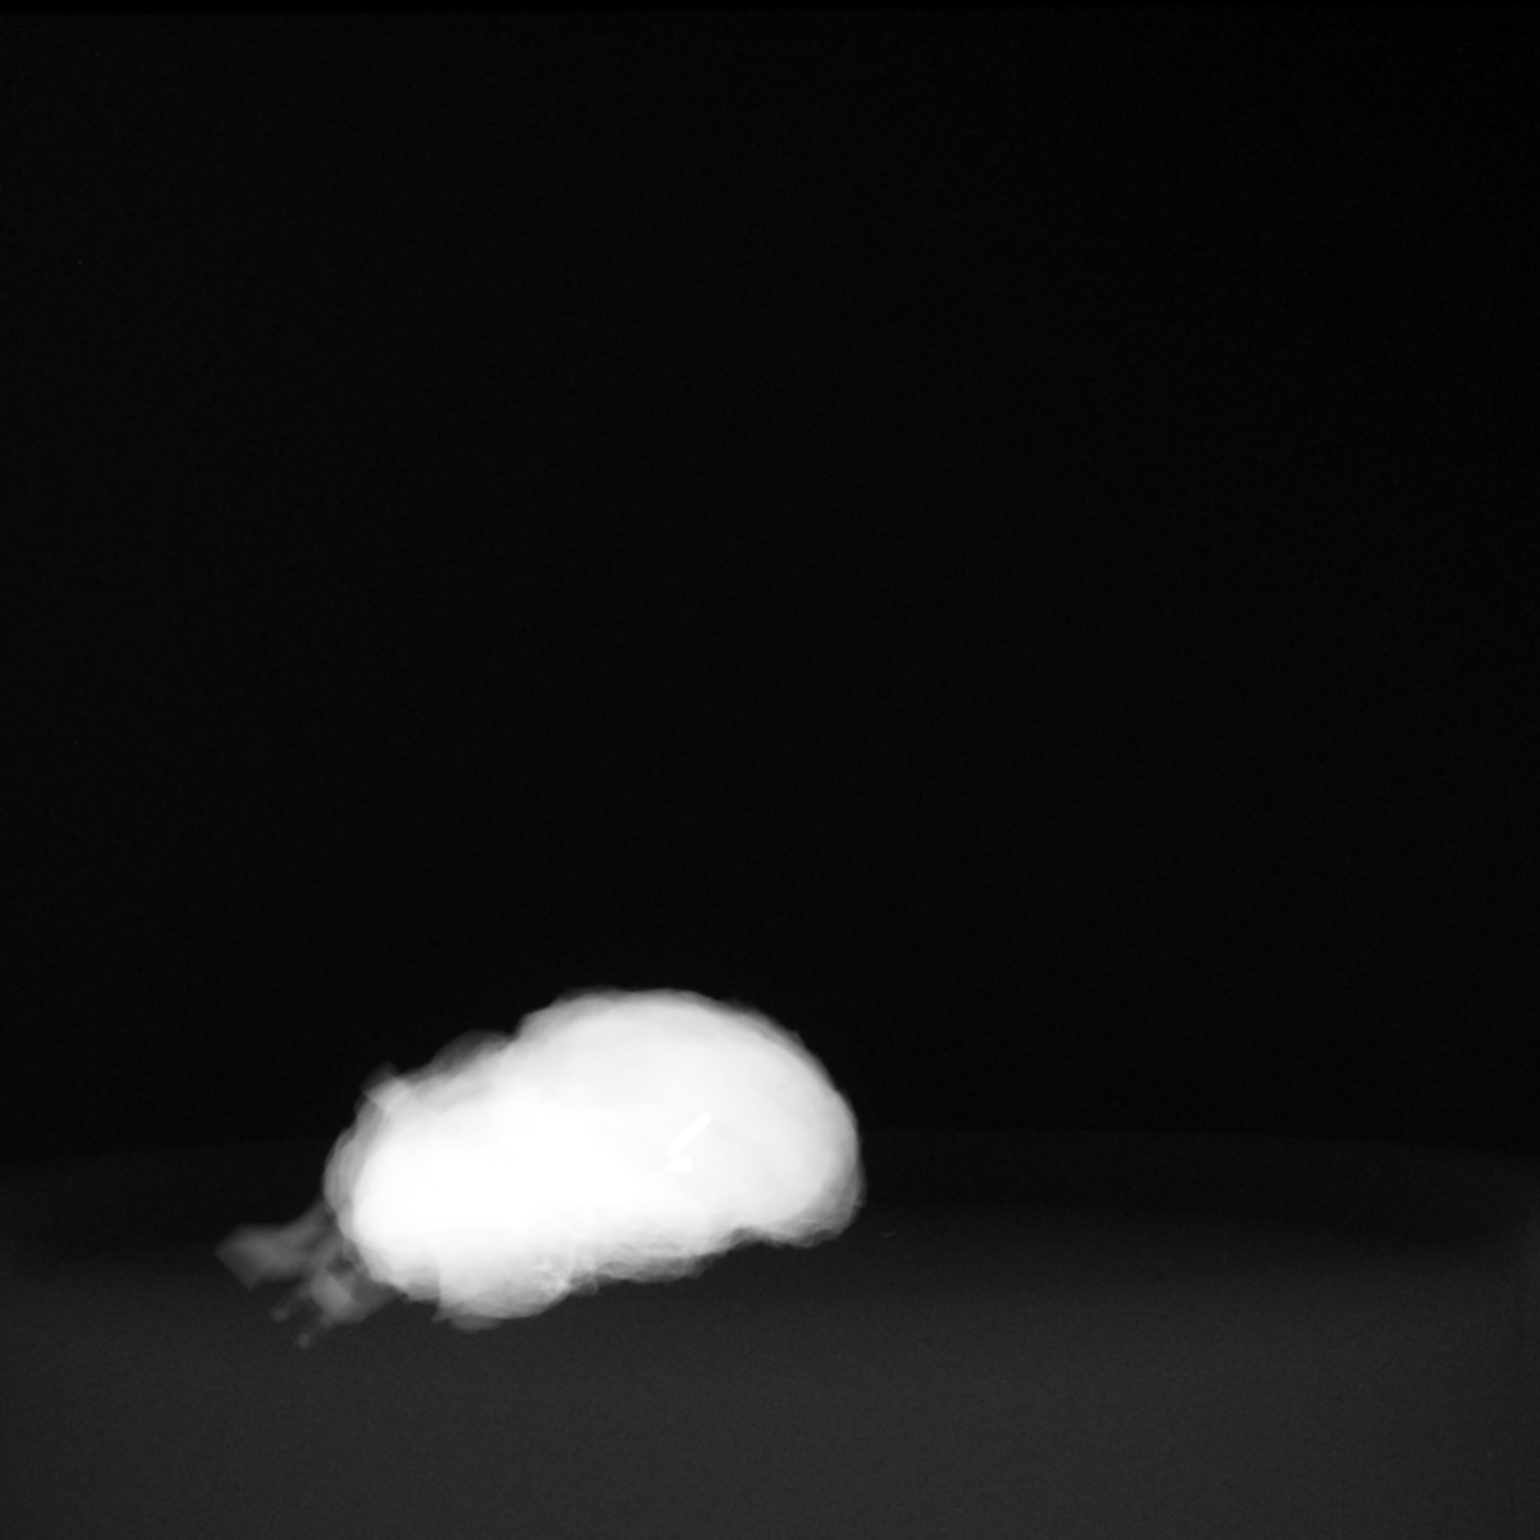

[2 of 2 positions shown; findings below may reference images not displayed]

FINDINGS: Status post excision of the left breast. The radioactive seed and
biopsy marker clip are present, completely intact, and were marked
for pathology.
IMPRESSION: Specimen radiograph of the left breast.

## 2021-04-11 SURGERY — BREAST LUMPECTOMY WITH RADIOACTIVE SEED LOCALIZATION
Anesthesia: General | Site: Breast | Laterality: Left

## 2021-04-11 MED ORDER — FENTANYL CITRATE (PF) 100 MCG/2ML IJ SOLN
INTRAMUSCULAR | Status: AC
  Filled 2021-04-11: qty 2

## 2021-04-11 MED ORDER — ONDANSETRON HCL 4 MG/2ML IJ SOLN
INTRAMUSCULAR | Status: DC | PRN
  Administered 2021-04-11: 4 mg via INTRAVENOUS

## 2021-04-11 MED ORDER — DEXAMETHASONE SODIUM PHOSPHATE 4 MG/ML IJ SOLN
INTRAMUSCULAR | Status: DC | PRN
  Administered 2021-04-11: 4 mg via INTRAVENOUS

## 2021-04-11 MED ORDER — LIDOCAINE 2% (20 MG/ML) 5 ML SYRINGE
INTRAMUSCULAR | Status: DC | PRN
Start: 2021-04-11 — End: 2021-04-11
  Administered 2021-04-11: 60 mg via INTRAVENOUS

## 2021-04-11 MED ORDER — CEFAZOLIN SODIUM-DEXTROSE 2-4 GM/100ML-% IV SOLN
INTRAVENOUS | Status: AC
  Filled 2021-04-11: qty 100

## 2021-04-11 MED ORDER — BUPIVACAINE-EPINEPHRINE 0.25% -1:200000 IJ SOLN
INTRAMUSCULAR | Status: DC | PRN
Start: 2021-04-11 — End: 2021-04-11
  Administered 2021-04-11: 10 mL

## 2021-04-11 MED ORDER — FENTANYL CITRATE (PF) 100 MCG/2ML IJ SOLN
INTRAMUSCULAR | Status: DC | PRN
  Administered 2021-04-11: 50 ug via INTRAVENOUS

## 2021-04-11 MED ORDER — PHENYLEPHRINE HCL (PRESSORS) 10 MG/ML IV SOLN
INTRAVENOUS | Status: DC | PRN
  Administered 2021-04-11: 80 ug via INTRAVENOUS

## 2021-04-11 MED ORDER — FENTANYL CITRATE (PF) 100 MCG/2ML IJ SOLN
25.0000 ug | INTRAMUSCULAR | Status: DC | PRN
  Administered 2021-04-11: 50 ug via INTRAVENOUS

## 2021-04-11 MED ORDER — CHLORHEXIDINE GLUCONATE CLOTH 2 % EX PADS: 6.0000 | MEDICATED_PAD | Freq: Once | CUTANEOUS | Status: DC

## 2021-04-11 MED ORDER — PROPOFOL 10 MG/ML IV BOLUS
INTRAVENOUS | Status: DC | PRN
  Administered 2021-04-11: 200 mg via INTRAVENOUS

## 2021-04-11 MED ORDER — LACTATED RINGERS IV SOLN: INTRAVENOUS | Status: DC

## 2021-04-11 MED ORDER — DEXAMETHASONE SODIUM PHOSPHATE 10 MG/ML IJ SOLN
INTRAMUSCULAR | Status: AC
  Filled 2021-04-11: qty 1

## 2021-04-11 MED ORDER — LIDOCAINE 2% (20 MG/ML) 5 ML SYRINGE
INTRAMUSCULAR | Status: AC
  Filled 2021-04-11: qty 5

## 2021-04-11 MED ORDER — ONDANSETRON HCL 4 MG/2ML IJ SOLN
INTRAMUSCULAR | Status: AC
  Filled 2021-04-11: qty 2

## 2021-04-11 MED ORDER — CEFAZOLIN SODIUM-DEXTROSE 2-4 GM/100ML-% IV SOLN
2.0000 g | INTRAVENOUS | Status: AC
  Administered 2021-04-11: 2 g via INTRAVENOUS

## 2021-04-11 MED ORDER — MIDAZOLAM HCL 5 MG/5ML IJ SOLN
INTRAMUSCULAR | Status: DC | PRN
  Administered 2021-04-11: 2 mg via INTRAVENOUS

## 2021-04-11 MED ORDER — ACETAMINOPHEN 500 MG PO TABS
ORAL_TABLET | ORAL | Status: AC
  Filled 2021-04-11: qty 2

## 2021-04-11 MED ORDER — ACETAMINOPHEN 500 MG PO TABS
1000.0000 mg | ORAL_TABLET | ORAL | Status: AC
  Administered 2021-04-11: 1000 mg via ORAL

## 2021-04-11 MED ORDER — MIDAZOLAM HCL 2 MG/2ML IJ SOLN
INTRAMUSCULAR | Status: AC
  Filled 2021-04-11: qty 2

## 2021-04-11 SURGICAL SUPPLY — 54 items
APL PRP STRL LF DISP 70% ISPRP (MISCELLANEOUS) ×1
APL SKNCLS STERI-STRIP NONHPOA (GAUZE/BANDAGES/DRESSINGS) ×1
APPLIER CLIP 9.375 MED OPEN (MISCELLANEOUS) ×2
APR CLP MED 9.3 20 MLT OPN (MISCELLANEOUS) ×1
BENZOIN TINCTURE PRP APPL 2/3 (GAUZE/BANDAGES/DRESSINGS) ×2 IMPLANT
BLADE HEX COATED 2.75 (ELECTRODE) ×2 IMPLANT
BLADE SURG 15 STRL LF DISP TIS (BLADE) ×1 IMPLANT
BLADE SURG 15 STRL SS (BLADE) ×2
CANISTER SUC SOCK COL 7IN (MISCELLANEOUS) IMPLANT
CANISTER SUCT 1200ML W/VALVE (MISCELLANEOUS) ×2 IMPLANT
CHLORAPREP W/TINT 26 (MISCELLANEOUS) ×2 IMPLANT
CLIP APPLIE 9.375 MED OPEN (MISCELLANEOUS) ×1 IMPLANT
COVER BACK TABLE 60X90IN (DRAPES) ×2 IMPLANT
COVER MAYO STAND STRL (DRAPES) ×2 IMPLANT
COVER PROBE W GEL 5X96 (DRAPES) ×2 IMPLANT
DRAPE LAPAROTOMY 100X72 PEDS (DRAPES) ×2 IMPLANT
DRAPE LAPAROTOMY TRNSV 102X78 (DRAPES) ×2 IMPLANT
DRAPE UTILITY XL STRL (DRAPES) ×2 IMPLANT
DRSG TEGADERM 4X4.75 (GAUZE/BANDAGES/DRESSINGS) ×2 IMPLANT
ELECT REM PT RETURN 9FT ADLT (ELECTROSURGICAL) ×2
ELECTRODE REM PT RTRN 9FT ADLT (ELECTROSURGICAL) ×1 IMPLANT
GAUZE SPONGE 4X4 12PLY STRL LF (GAUZE/BANDAGES/DRESSINGS) ×2 IMPLANT
GLOVE SURG ENC MOIS LTX SZ7 (GLOVE) ×2 IMPLANT
GLOVE SURG UNDER POLY LF SZ7.5 (GLOVE) ×2 IMPLANT
GOWN STRL REUS W/ TWL LRG LVL3 (GOWN DISPOSABLE) ×2 IMPLANT
GOWN STRL REUS W/TWL LRG LVL3 (GOWN DISPOSABLE) ×4
ILLUMINATOR WAVEGUIDE N/F (MISCELLANEOUS) IMPLANT
KIT MARKER MARGIN INK (KITS) ×2 IMPLANT
LIGHT WAVEGUIDE WIDE FLAT (MISCELLANEOUS) IMPLANT
NDL HYPO 25X1 1.5 SAFETY (NEEDLE) ×1 IMPLANT
NEEDLE HYPO 25X1 1.5 SAFETY (NEEDLE) ×2 IMPLANT
NS IRRIG 1000ML POUR BTL (IV SOLUTION) ×2 IMPLANT
PACK BASIN DAY SURGERY FS (CUSTOM PROCEDURE TRAY) ×2 IMPLANT
PENCIL SMOKE EVACUATOR (MISCELLANEOUS) ×2 IMPLANT
SLEEVE SCD COMPRESS KNEE MED (STOCKING) ×2 IMPLANT
SPIKE FLUID TRANSFER (MISCELLANEOUS) IMPLANT
SPONGE GAUZE 2X2 8PLY STRL LF (GAUZE/BANDAGES/DRESSINGS) IMPLANT
SPONGE INTESTINAL PEANUT (DISPOSABLE) IMPLANT
SPONGE T-LAP 18X18 ~~LOC~~+RFID (SPONGE) IMPLANT
SPONGE T-LAP 4X18 ~~LOC~~+RFID (SPONGE) ×2 IMPLANT
STRIP CLOSURE SKIN 1/2X4 (GAUZE/BANDAGES/DRESSINGS) ×2 IMPLANT
SUT MNCRL AB 4-0 PS2 18 (SUTURE) ×2 IMPLANT
SUT MON AB 4-0 PC3 18 (SUTURE) ×2 IMPLANT
SUT SILK 0 TIES 10X30 (SUTURE) IMPLANT
SUT SILK 2 0 SH (SUTURE) IMPLANT
SUT VIC AB 3-0 SH 27 (SUTURE) ×2
SUT VIC AB 3-0 SH 27X BRD (SUTURE) ×1 IMPLANT
SYR BULB EAR ULCER 3OZ GRN STR (SYRINGE) IMPLANT
SYR CONTROL 10ML LL (SYRINGE) ×2 IMPLANT
SYRINGE 1CC 27X.5 TB SAFETYGLD (MISCELLANEOUS) IMPLANT
TOWEL GREEN STERILE FF (TOWEL DISPOSABLE) ×2 IMPLANT
TRAY FAXITRON CT DISP (TRAY / TRAY PROCEDURE) ×2 IMPLANT
TUBE CONNECTING 20X1/4 (TUBING) ×2 IMPLANT
YANKAUER SUCT BULB TIP NO VENT (SUCTIONS) ×2 IMPLANT

## 2021-04-11 NOTE — H&P (Signed)
Subjective    Chief Complaint: Breast Discharge       History of Present Illness: Valerie Bauer is a 51 y.o. female who is seen today as an office consultation at the request of Dr. Gwendlyn Deutscher for evaluation of Breast Discharge .   This is a 50 year old female who was initially seen 1 year ago for left nipple discharge.   The patient has a family history of breast cancer in her mother who passed away from breast cancer.  She presents with a 1 year history of intermittent discharge from the left nipple.  She had 1 occasion where the drainage appeared bloody.  She had a thorough work-up including mammogram and ultrasound.  The ultrasound showed normal caliber ducts in the subareolar location.  However in the retroareolar breast at 2:00 1 cm from the nipple there is an oval mass measuring 1.1 x 0.5 x 0.9 centimeters.  The axilla was negative.  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  MRI was otherwise negative.  We had recommended a radioactive seed localized lumpectomy last year but she declined to schedule surgery due to financial reasons.  The discharge has persisted.  Occasionally is heavy.  She had a follow-up mammogram and ultrasound in May 2022 that showed no significant changes.  The myoepithelioma persists.     Review of Systems: A complete review of systems was obtained from the patient.  I have reviewed this information and discussed as appropriate with the patient.  See HPI as well for other ROS.   Review of Systems  Constitutional: Negative.   HENT: Negative.   Eyes: Negative.   Respiratory: Negative.   Cardiovascular: Negative.   Gastrointestinal: Negative.   Genitourinary: Negative.   Musculoskeletal: Negative.   Skin: Negative.   Neurological: Negative.   Endo/Heme/Allergies: Negative.   Psychiatric/Behavioral: Negative.         Medical History:     Past Medical History:  Diagnosis Date   Hyperlipidemia     Hypertension     Thyroid disease            Patient Active Problem List  Diagnosis   Nipple discharge in female   Subareolar mass of left breast   Discharge from breast   Hypothyroidism (acquired), unspecified   Myoepithelioma   Obesity (BMI 30-39.9), unspecified   Primary hypertension   Type 2 diabetes mellitus without complication, without long-term current use of insulin (CMS-HCC)           Past Surgical History:  Procedure Laterality Date   HYSTERECTOMY          Not on File         Current Outpatient Medications on File Prior to Visit  Medication Sig Dispense Refill   atorvastatin (LIPITOR) 40 MG tablet Take 1 tablet by mouth once daily       dulaglutide (TRULICITY) 1.5 HQ/7.5 mL subcutaneous pen injector Inject subcutaneously       levothyroxine (SYNTHROID) 125 MCG tablet Take by mouth       valACYclovir (VALTREX) 500 MG tablet Take by mouth       chlorthalidone 25 MG tablet Take 25 mg by mouth once daily       JARDIANCE 25 mg tablet Take 25 mg by mouth once daily APPOINTMENT REQUIRED FOR FUTURE REFILLS       losartan (COZAAR) 50 MG tablet Take 50 mg by mouth once daily       metFORMIN (GLUCOPHAGE) 1000 MG tablet Take by mouth  No current facility-administered medications on file prior to visit.           Family History  Problem Relation Age of Onset   Breast cancer Mother     Coronary Artery Disease (Blocked arteries around heart) Father     Hyperlipidemia (Elevated cholesterol) Father     High blood pressure (Hypertension) Father        Social History       Tobacco Use  Smoking Status Never  Smokeless Tobacco Never      Social History        Socioeconomic History   Marital status: Single  Tobacco Use   Smoking status: Never   Smokeless tobacco: Never  Substance and Sexual Activity   Alcohol use: Never   Drug use: Never      Objective:         Vitals:     BP: 120/82  Pulse: 73  Temp: 36.9 C (98.5 F)  SpO2: 99%  Weight: 82.4 kg (181 lb 9.6 oz)  Height: 152.4 cm (5')     Body mass index is 35.47 kg/m.   Physical Exam    Constitutional:  WDWN in NAD, conversant, no obvious deformities; lying in bed comfortably Eyes:  Pupils equal, round; sclera anicteric; moist conjunctiva; no lid lag HENT:  Oral mucosa moist; good dentition  Neck:  No masses palpated, trachea midline; no thyromegaly Lungs:  CTA bilaterally; normal respiratory effort Breasts:  Symmetric, no palpable lymphadenopathy on either side.  The left nipple shows easily expressed clear discharge from the center of the nipple.  There are some indistinct palpable retroareolar masses that are quite small in the upper outer quadrant CV:  Regular rate and rhythm; no murmurs; extremities well-perfused with no edema Abd:  +bowel sounds, soft, non-tender, no palpable organomegaly; no palpable hernias Musc:  Unable to assess gait; no apparent clubbing or cyanosis in extremities Lymphatic:  No palpable cervical or axillary lymphadenopathy Skin:  Warm, dry; no sign of jaundice Psychiatric - alert and oriented x 4; calm mood and affect     Labs, Imaging and Diagnostic Testing: CLINICAL DATA:  Bloody nipple discharge. History of suspicious LEFT-sided spontaneous clear nipple discharge for greater than a year. However patient states that the LEFT-sided nipple discharge is now bloody.   Ultrasound-guided biopsy of a retroareolar LEFT breast mass (2 o'clock axis) on 01/18/2020 revealed benign myoepithelioma.   EXAM: DIGITAL DIAGNOSTIC BILATERAL MAMMOGRAM WITH TOMOSYNTHESIS AND CAD; ULTRASOUND LEFT BREAST LIMITED   TECHNIQUE: Bilateral digital diagnostic mammography and breast tomosynthesis was performed. The images were evaluated with computer-aided detection.; Targeted ultrasound examination of the left breast was performed   COMPARISON:  Previous exams including ultrasound-guided LEFT breast biopsy dated 01/18/2020.   ACR Breast Density Category c: The breast tissue is heterogeneously dense, which  may obscure small masses.   FINDINGS: Biopsy site marker within the LEFT breast is stable in position, corresponding to the patient's known myoepithelioma. There are no new dominant masses, suspicious calcifications or secondary signs of malignancy within either breast.   Targeted ultrasound is performed, evaluating the subareolar LEFT breast, showing no new solid or cystic mass. No intraductal mass or vascularity. Again demonstrated is the hypoechoic mass at the 2 o'clock axis, 1 cm from the nipple, measuring 1.1 x 0.5 x 0.9 cm, stable to slightly smaller compared to the previous ultrasound of 01/04/2020, compatible with the biopsy-proven myoepithelioma.   IMPRESSION: 1. No evidence of malignancy within either breast. 2. No source for patient's bloody  LEFT-sided nipple discharge is identified today. 3. Stable, or slightly smaller, mass within the retroareolar LEFT breast, 2 o'clock axis, measuring 1.1 cm, corresponding to the biopsy-proven myoepithelioma.   RECOMMENDATION: 1. Referral to breast surgeon for possible central duct excision as there is no source identified as yet for patient's suspicious LEFT-sided nipple discharge (now bloody), neither on today's exam or on the recent breast MRI report of 02/25/2020. 2. Per treatment plan for patient's biopsy-proven myoepithelioma within the retroareolar LEFT breast, 2 o'clock axis. Recommendation by Dr. Jimmye Norman on a biopsy report of 01/18/2020 reads as follows: "Myoepithelioma is a very rare lesion. The pathologist assured me that there are no signs of malignancy. However, given the rare nature of this mass, it was decided to follow biopsy mass in the LEFT breast at 2 o'clock in 6 months with ultrasound to ensure stability. However, if patient goes to surgery for the LEFT nipple discharge, I would recommend localizing and removing the mass at that time since it is in close proximity to the retroareolar region." As above, the  follow-up ultrasound recommended above was performed today and shows no significant change in size of the myoepithelioma.   I have discussed these findings and recommendations with the patient today. If applicable, a reminder letter will be sent to the patient regarding the next appointment.   BI-RADS CATEGORY  2: Benign. However, there remains suspicious LEFT-sided bloody nipple discharge for which referral to breast surgeon is recommended for possible central duct excision.   Electronically Signed: By: Franki Cabot M.D. On: 07/14/2020 13:24   CLINICAL DATA:  51 year old female with spontaneous clear LEFT nipple discharge for 1 year. UPPER-OUTER RETROAREOLAR LEFT breast mass biopsy on 01/18/2020 demonstrating a myoepithelioma.   LABS:  None performed today   EXAM: BILATERAL BREAST MRI WITH AND WITHOUT CONTRAST   TECHNIQUE: Multiplanar, multisequence MR images of both breasts were obtained prior to and following the intravenous administration of 8 ml of Gadavist   Three-dimensional MR images were rendered by post-processing of the original MR data on an independent workstation. The three-dimensional MR images were interpreted, and findings are reported in the following complete MRI report for this study. Three dimensional images were evaluated at the independent interpreting workstation using the DynaCAD thin client.   COMPARISON:  Prior mammograms and ultrasounds   FINDINGS: Breast composition: d. Extreme fibroglandular tissue.   Background parenchymal enhancement: Moderate   Right breast: Numerous similar appearing scattered foci within the RIGHT breast identified. No suspicious mass or enhancement noted.   Left breast: Numerous similar appearing scattered foci within the LEFT breast identified. No suspicious mass or enhancement noted. Biopsy clip artifact within the UPPER-OUTER RETROAREOLAR LEFT breast is identified.   Lymph nodes: No abnormal appearing lymph  nodes.   Ancillary findings:  None.   IMPRESSION: 1. No findings suspicious for breast malignancy. 2. Numerous similar appearing scattered foci within both breast. Given bilateral similarity and stable mammographic appearance from 2010, no further imaging follow-up is recommended. 3. Biopsy clip artifact within the UPPER OUTER RETROAREOLAR LEFT breast at the site of biopsy-proven myoepithelioma.   RECOMMENDATION: 1. Surgical/clinical follow-up of LEFT nipple discharge. 2. Bilateral screening mammogram in April 2022 to resume annual mammogram schedule.   BI-RADS CATEGORY  2: Benign.     Electronically Signed   By: Margarette Canada M.D.   On: 02/28/2020 12:35 Assessment and Plan:  Diagnoses and all orders for this visit:   Nipple discharge in female   Subareolar mass of left breast  Recommend left radioactive seed localized lumpectomy to excise the persistent breast mass.  We will also perform a left nipple duct excision.The surgical procedure has been discussed with the patient.  Potential risks, benefits, alternative treatments, and expected outcomes have been explained.  All of the patient's questions at this time have been answered.  The likelihood of reaching the patient's treatment goal is good.  The patient understand the proposed surgical procedure and wishes to proceed.     Imogene Burn. Georgette Dover, MD, [facility]  General Surgery   04/11/2021 8:28 AM

## 2021-04-11 NOTE — Transfer of Care (Signed)
Immediate Anesthesia Transfer of Care Note  Patient: Valerie Bauer  Procedure(s) Performed: LEFT BREAST LUMPECTOMY WITH RADIOACTIVE SEED LOCALIZATION (Left: Breast) LEFT NIPPLE DUCT EXCISION (Left: Breast)  Patient Location: PACU  Anesthesia Type:General  Level of Consciousness: sedated  Airway & Oxygen Therapy: Patient Spontanous Breathing and Patient connected to face mask oxygen  Post-op Assessment: Report given to RN and Post -op Vital signs reviewed and stable  Post vital signs: Reviewed and stable  Last Vitals:  Vitals Value Taken Time  BP 146/81 04/11/21 1055  Temp    Pulse 78 04/11/21 1057  Resp 13 04/11/21 1057  SpO2 100 % 04/11/21 1057  Vitals shown include unvalidated device data.  Last Pain:  Vitals:   04/11/21 0826  TempSrc: Oral  PainSc: 0-No pain         Complications: No notable events documented.

## 2021-04-11 NOTE — Anesthesia Postprocedure Evaluation (Signed)
Anesthesia Post Note  Patient: Valerie Bauer  Procedure(s) Performed: LEFT BREAST LUMPECTOMY WITH RADIOACTIVE SEED LOCALIZATION (Left: Breast) LEFT NIPPLE DUCT EXCISION (Left: Breast)     Patient location during evaluation: PACU Anesthesia Type: General Level of consciousness: awake and alert Pain management: pain level controlled Vital Signs Assessment: post-procedure vital signs reviewed and stable Respiratory status: spontaneous breathing, nonlabored ventilation, respiratory function stable and patient connected to nasal cannula oxygen Cardiovascular status: blood pressure returned to baseline and stable Postop Assessment: no apparent nausea or vomiting Anesthetic complications: no   No notable events documented.  Last Vitals:  Vitals:   04/11/21 1130 04/11/21 1159  BP: (!) 149/95 (!) 149/97  Pulse: 79 79  Resp: 14 18  Temp:  36.6 C  SpO2: 99% 99%    Last Pain:  Vitals:   04/11/21 1159  TempSrc:   PainSc: 0-No pain                 Christyan Reger L Ann Groeneveld

## 2021-04-11 NOTE — Discharge Instructions (Addendum)
[place] Office Phone Number [phone]  BREAST BIOPSY/ PARTIAL MASTECTOMY: POST OP INSTRUCTIONS  Always review your discharge instruction sheet given to you by the facility where your surgery was performed.  IF YOU HAVE DISABILITY OR FAMILY LEAVE FORMS, YOU MUST BRING THEM TO THE OFFICE FOR PROCESSING.  DO NOT GIVE THEM TO YOUR DOCTOR.  A prescription for pain medication may be given to you upon discharge.  Take your pain medication as prescribed, if needed.  If narcotic pain medicine is not needed, then you may take acetaminophen (Tylenol) or ibuprofen (Advil) as needed. Take your usually prescribed medications unless otherwise directed If you need a refill on your pain medication, please contact your pharmacy.  They will contact our office to request authorization.  Prescriptions will not be filled after 5pm or on week-ends. You should eat very light the first 24 hours after surgery, such as soup, crackers, pudding, etc.  Resume your normal diet the day after surgery. Most patients will experience some swelling and bruising in the breast.  Ice packs and a good support bra will help.  Swelling and bruising can take several days to resolve.  It is common to experience some constipation if taking pain medication after surgery.  Increasing fluid intake and taking a stool softener will usually help or prevent this problem from occurring.  A mild laxative (Milk of Magnesia or Miralax) should be taken according to package directions if there are no bowel movements after 48 hours. Unless discharge instructions indicate otherwise, you may remove your bandages 24-48 hours after surgery, and you may shower at that time.  You may have steri-strips (small skin tapes) in place directly over the incision.  These strips should be left on the skin for 7-10 days.  If your surgeon used skin glue on the incision, you may shower in 24 hours.  The glue will flake off over the next 2-3 weeks.   Any sutures or staples will be removed at the office during your follow-up visit. ACTIVITIES:  You may resume regular daily activities (gradually increasing) beginning the next day.  Wearing a good support bra or sports bra minimizes pain and swelling.  You may have sexual intercourse when it is comfortable. You may drive when you no longer are taking prescription pain medication, you can comfortably wear a seatbelt, and you can safely maneuver your car and apply brakes. RETURN TO WORK:  ______________________________________________________________________________________ Dennis Bast should see your doctor in the office for a follow-up appointment approximately two weeks after your surgery.  Your doctors nurse will typically make your follow-up appointment when she calls you with your pathology report.  Expect your pathology report 2-3 business days after your surgery.  You may call to check if you do not hear from [place] after three days. OTHER INSTRUCTIONS: _______________________________________________________________________________________________ _____________________________________________________________________________________________________________________________________ _____________________________________________________________________________________________________________________________________ _____________________________________________________________________________________________________________________________________  WHEN TO CALL YOUR DOCTOR: Fever over 101.0 Nausea and/or vomiting. Extreme swelling or bruising. Continued bleeding from incision. Increased pain, redness, or drainage from the incision.  The clinic staff is available to answer your questions during regular business hours.  Please dont hesitate to call and ask to speak to one of the nurses for clinical concerns.  If you have a medical emergency, go to the nearest emergency room or call 911.  A surgeon from [facility] is always on call at the hospital.  For further questions, please visit [email or web address]   *You had 1000 mg of Tylenol at 8:30 AM   [place]  Instructions  Activity: Get plenty of rest for the remainder of the day. A responsible individual must stay with you for 24 hours following the procedure.  For the next 24 hours, DO NOT: -Drive a car -Paediatric nurse -Drink alcoholic beverages -Take any medication unless instructed by your physician -Make any legal decisions or sign important papers.  Meals: Start with liquid foods such as gelatin or soup. Progress to regular foods as tolerated. Avoid greasy, spicy, heavy foods. If nausea and/or vomiting occur, drink only clear liquids until the nausea and/or vomiting subsides. Call your physician if vomiting continues.  Special Instructions/Symptoms: Your throat may feel dry or sore from the anesthesia or the breathing tube placed in your throat during surgery. If this causes discomfort, gargle with warm salt water. The discomfort should disappear within 24 hours.  If you had a scopolamine patch placed behind your ear for the management of post- operative nausea and/or vomiting:  1. The medication in the patch is effective for 72 hours, after which it should be removed.  Wrap patch in a tissue and discard in the trash. Wash hands thoroughly with soap and water. 2. You may remove the patch earlier than 72 hours if you experience unpleasant side effects which may include dry mouth, dizziness or visual disturbances. 3. Avoid touching the patch. Wash your hands with soap and water after contact with the patch.

## 2021-04-11 NOTE — Op Note (Signed)
Pre-op Diagnosis:  Left breast mass/ left nipple discharge Post-op Diagnosis: same Procedure:  Left radioactive seed localized lumpectomy/ left nipple duct excision Surgeon:  Letzy Gullickson K. Anesthesia:  GEN - LMA Indications:  The patient has a family history of breast cancer in her mother who passed away from breast cancer.  She presents with a 1 year history of intermittent discharge from the left nipple.  She had 1 occasion where the drainage appeared bloody.  She had a thorough work-up including mammogram and ultrasound.  The ultrasound showed normal caliber ducts in the subareolar location.  However in the retroareolar breast at 2:00 1 cm from the nipple there is an oval mass measuring 1.1 x 0.5 x 0.9 centimeters.  The axilla was negative.  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  MRI was otherwise negative.  We had recommended a radioactive seed localized lumpectomy last year but she declined to schedule surgery due to financial reasons.  The discharge has persisted.  Occasionally is heavy.  She had a follow-up mammogram and ultrasound in May 2022 that showed no significant changes.  The myoepithelioma persists. Description of procedure: The patient is brought to the operating room placed in supine position on the operating room table. After an adequate level of general anesthesia was obtained, her left breast was prepped with ChloraPrep and draped in sterile fashion. A timeout was taken to ensure the proper patient and proper procedure. The draining central nipple duct was cannulated with a lacrimal duct probe.  We interrogated the breast with the neoprobe. We made a circumareolar incision around the lateral side of the nipple after infiltrating with 0.25% Marcaine. Dissection was carried behind the nipple with cautery. I was able to identify the draining duct containing the lacrimal duct probe.  We amputated this duct off of the back of the skin of the nipple.  We then excised a cone of breast  tissue, with the superficial end of the duct as the point of the cone.  We used the neoprobe to guide [place] towards the radioactive seed. We excised an area of tissue around the radioactive seed 2 cm in diameter. The specimen was removed and was oriented with a paint kit. The clamp was placed at the superficial border of the nipple duct excision.  Specimen mammogram showed the radioactive seed as well as the biopsy clip within the specimen. This was sent for pathologic examination. There is no residual radioactivity within the biopsy cavity. We inspected carefully for hemostasis. The wound was thoroughly irrigated. The wound was closed with a deep layer of 3-0 Vicryl and a subcuticular layer of 4-0 Monocryl. Benzoin Steri-Strips were applied. The patient was then extubated and brought to the recovery room in stable condition. All sponge, instrument, and needle counts are correct.  Imogene Burn. Georgette Dover, MD, [facility]  General/ Trauma Surgery  04/11/2021 10:50 AM

## 2021-04-11 NOTE — Anesthesia Preprocedure Evaluation (Addendum)
Anesthesia Evaluation  Patient identified by MRN, date of birth, ID band Patient awake    Reviewed: Allergy & Precautions, NPO status , Patient's Chart, lab work & pertinent test results  Airway Mallampati: II  TM Distance: >3 FB Neck ROM: Full    Dental no notable dental hx. (+) Teeth Intact, Dental Advisory Given   Pulmonary neg pulmonary ROS,    Pulmonary exam normal breath sounds clear to auscultation       Cardiovascular hypertension, Normal cardiovascular exam Rhythm:Regular Rate:Normal     Neuro/Psych negative neurological ROS  negative psych ROS   GI/Hepatic negative GI ROS, Neg liver ROS,   Endo/Other  diabetes, Type 2, Oral Hypoglycemic AgentsHypothyroidism   Renal/GU negative Renal ROS  negative genitourinary   Musculoskeletal negative musculoskeletal ROS (+)   Abdominal   Peds  Hematology negative hematology ROS (+)   Anesthesia Other Findings   Reproductive/Obstetrics                            Anesthesia Physical Anesthesia Plan  ASA: 2  Anesthesia Plan: General   Post-op Pain Management:    Induction: Intravenous  PONV Risk Score and Plan: 3 and Ondansetron, Dexamethasone and Midazolam  Airway Management Planned: LMA  Additional Equipment:   Intra-op Plan:   Post-operative Plan: Extubation in OR  Informed Consent: I have reviewed the patients History and Physical, chart, labs and discussed the procedure including the risks, benefits and alternatives for the proposed anesthesia with the patient or authorized representative who has indicated his/her understanding and acceptance.     Dental advisory given  Plan Discussed with: CRNA  Anesthesia Plan Comments:         Anesthesia Quick Evaluation

## 2021-04-11 NOTE — Anesthesia Procedure Notes (Signed)
Procedure Name: LMA Insertion Date/Time: 04/11/2021 9:56 AM Performed by: Maryella Shivers, CRNA Pre-anesthesia Checklist: Patient identified, Emergency Drugs available, Suction available and Patient being monitored Patient Re-evaluated:Patient Re-evaluated prior to induction Oxygen Delivery Method: Circle system utilized Preoxygenation: Pre-oxygenation with 100% oxygen Induction Type: IV induction Ventilation: Mask ventilation without difficulty LMA: LMA inserted LMA Size: 4.0 Number of attempts: 1 Airway Equipment and Method: Bite block Placement Confirmation: positive ETCO2 Tube secured with: Tape Dental Injury: Teeth and Oropharynx as per pre-operative assessment

## 2021-04-12 ENCOUNTER — Encounter (HOSPITAL_BASED_OUTPATIENT_CLINIC_OR_DEPARTMENT_OTHER): Payer: Self-pay | Admitting: Surgery

## 2021-04-13 LAB — SURGICAL PATHOLOGY

## 2021-04-16 ENCOUNTER — Other Ambulatory Visit: Payer: Self-pay

## 2021-04-16 DIAGNOSIS — E1169 Type 2 diabetes mellitus with other specified complication: Secondary | ICD-10-CM

## 2021-04-16 DIAGNOSIS — E785 Hyperlipidemia, unspecified: Secondary | ICD-10-CM

## 2021-04-16 MED ORDER — ATORVASTATIN CALCIUM 80 MG PO TABS: ORAL_TABLET | ORAL | 3 refills | Status: DC

## 2021-04-17 ENCOUNTER — Encounter: Payer: Self-pay | Admitting: Family Medicine

## 2021-04-18 ENCOUNTER — Other Ambulatory Visit: Payer: Self-pay | Admitting: Family Medicine

## 2021-04-24 MED ORDER — BLOOD GLUCOSE MONITOR KIT: PACK | 0 refills | Status: DC

## 2021-05-03 ENCOUNTER — Encounter (HOSPITAL_COMMUNITY): Payer: Self-pay

## 2021-05-20 ENCOUNTER — Other Ambulatory Visit: Payer: Self-pay | Admitting: Family Medicine

## 2021-05-20 DIAGNOSIS — E119 Type 2 diabetes mellitus without complications: Secondary | ICD-10-CM

## 2021-05-21 ENCOUNTER — Other Ambulatory Visit: Payer: Self-pay

## 2021-05-21 DIAGNOSIS — I152 Hypertension secondary to endocrine disorders: Secondary | ICD-10-CM

## 2021-05-22 MED ORDER — LOSARTAN POTASSIUM 50 MG PO TABS: 50.0000 mg | ORAL_TABLET | Freq: Every day | ORAL | 0 refills | Status: DC

## 2021-06-29 ENCOUNTER — Encounter: Payer: Self-pay | Admitting: Family Medicine

## 2021-06-29 ENCOUNTER — Ambulatory Visit: Payer: BC Managed Care – PPO | Admitting: Family Medicine

## 2021-06-29 VITALS — BP 142/95 | HR 74 | Wt 186.0 lb

## 2021-06-29 DIAGNOSIS — E039 Hypothyroidism, unspecified: Secondary | ICD-10-CM | POA: Diagnosis not present

## 2021-06-29 DIAGNOSIS — I152 Hypertension secondary to endocrine disorders: Secondary | ICD-10-CM

## 2021-06-29 DIAGNOSIS — E119 Type 2 diabetes mellitus without complications: Secondary | ICD-10-CM

## 2021-06-29 DIAGNOSIS — E1159 Type 2 diabetes mellitus with other circulatory complications: Secondary | ICD-10-CM

## 2021-06-29 DIAGNOSIS — I10 Essential (primary) hypertension: Secondary | ICD-10-CM

## 2021-06-29 LAB — POCT GLYCOSYLATED HEMOGLOBIN (HGB A1C): HbA1c, POC (controlled diabetic range): 7.5 % — AB (ref 0.0–7.0)

## 2021-06-29 MED ORDER — TRULICITY 1.5 MG/0.5ML ~~LOC~~ SOAJ: 1.5000 mg | SUBCUTANEOUS | 2 refills | Status: DC

## 2021-06-29 MED ORDER — LOSARTAN POTASSIUM 50 MG PO TABS: 50.0000 mg | ORAL_TABLET | Freq: Every day | ORAL | 0 refills | Status: DC

## 2021-06-29 MED ORDER — METFORMIN HCL ER 500 MG PO TB24: 500.0000 mg | ORAL_TABLET | Freq: Two times a day (BID) | ORAL | 3 refills | Status: DC

## 2021-06-29 MED ORDER — EMPAGLIFLOZIN 25 MG PO TABS: ORAL_TABLET | ORAL | 1 refills | Status: DC

## 2021-06-29 NOTE — Progress Notes (Signed)
? ? ?  SUBJECTIVE:  ? ?CHIEF COMPLAINT / HPI:  ? ?Diabetes checkup ?Most recent hemoglobin A1c on 02/05/2021 was 10.2.  Current medication regimen includes Jardiance 25 mg daily, metformin 1000 mg twice daily, Trulicity.  Not taking metformin because of GI side effects.  Reports that the 1000 mg twice daily was causing nausea and vomiting as well as diarrhea.  Did not try to lower doses.  She has been doing well with her new regimen. ? ?Hypothyroidism ?Patient had lab work completed in February 2023 showing TSH of 7.6.  She reports that she was taking her Synthroid regularly when they took that lab work.  She reports that since the last visit she has started noticing symptoms like jitteriness and nerve pain which she experienced last time she was having issues with her thyroid hormone.  She feels like she may need to increase the dose at this time. ? ?Hypertension ?Patient reports her blood pressures are usually well controlled but she has been out of her blood pressure medications and so her blood pressure is elevated.  Denies any headaches or chest pain or shortness of breath.  Would like refills on her blood pressure medications today. ? ? ?OBJECTIVE:  ? ?BP (!) 142/95   Pulse 74   Wt 186 lb (84.4 kg)   LMP 01/30/2019   SpO2 100%   BMI 36.33 kg/m?   ?General: Well-appearing 51 year old female no acute distress ?Cardiac: Regular rate and rhythm, no murmurs appreciated ?Respiratory: Breathing, lungs clear to auscultation bilaterally ?Abdomen: Soft, nontender, positive bowel sounds ?MSK: No gross abnormalities ? ?Diabetic Foot Exam - Simple   ?Simple Foot Form ?Visual Inspection ?No deformities, no ulcerations, no other skin breakdown bilaterally: Yes ?Sensation Testing ?Intact to touch and monofilament testing bilaterally: Yes ?Pulse Check ?Posterior Tibialis and Dorsalis pulse intact bilaterally: Yes ?Comments ?  ? ? ?ASSESSMENT/PLAN:  ? ?Hypothyroidism (acquired) ?TSH elevated at 8 today.  Patient is  symptomatic.  Increasing dose to 137 mcg daily.  Return in 1 month for repeat check and consider changes at that time. ? ?Type 2 diabetes mellitus without complication, without long-term current use of insulin ([place]) ?Greatly improved since previous visit.  Hemoglobin A1c today 7.5.  Currently on Jardiance and Trulicity but not compliant with metformin due to abdominal pain and GI side effects.  Patient agrees to try lower dose of extended release metformin.  500 mg twice daily sent to patient's pharmacy.  Continue Jardiance 25 mg daily and Trulicity 0.5 mg weekly.  Follow-up in 3 months for repeat hemoglobin A1c. ? ?Primary hypertension ?Not controlled at this time.  Patient has not been compliant because she has been out of her medications.  Refill sent for blood pressure medications.  She is following up in 1 month and will have blood pressure reevaluated on that day. ?  ? ? ?Gifford Shave, MD ?[place]  ? ?

## 2021-06-29 NOTE — Patient Instructions (Signed)
It was wonderful seeing you today.  Congratulations on your great improvement on your hemoglobin A1c, it was 7.5 today.  I want to continue on your current medications but I do want you to try metformin.  I have sent a prescription for 500 mg extended release which she can start taking once daily and then increase to twice daily after a few weeks.  Regarding her thyroid I am checking your TSH today and will make adjustments to medications as needed.  If you have any issues, questions, concerns please feel free to call the clinic.  I hope you have a wonderful day! ? ? ?

## 2021-06-30 LAB — TSH: TSH: 8.24 u[IU]/mL — ABNORMAL HIGH (ref 0.450–4.500)

## 2021-06-30 MED ORDER — LEVOTHYROXINE SODIUM 137 MCG PO TABS
137.0000 ug | ORAL_TABLET | Freq: Every day | ORAL | 3 refills | Status: DC
Start: 2021-06-30 — End: 2023-03-28

## 2021-06-30 NOTE — Assessment & Plan Note (Signed)
Not controlled at this time.  Patient has not been compliant because she has been out of her medications.  Refill sent for blood pressure medications.  She is following up in 1 month and will have blood pressure reevaluated on that day. ?

## 2021-06-30 NOTE — Assessment & Plan Note (Signed)
TSH elevated at 8 today.  Patient is symptomatic.  Increasing dose to 137 mcg daily.  Return in 1 month for repeat check and consider changes at that time. ?

## 2021-06-30 NOTE — Assessment & Plan Note (Signed)
Greatly improved since previous visit.  Hemoglobin A1c today 7.5.  Currently on Jardiance and Trulicity but not compliant with metformin due to abdominal pain and GI side effects.  Patient agrees to try lower dose of extended release metformin.  500 mg twice daily sent to patient's pharmacy.  Continue Jardiance 25 mg daily and Trulicity 0.5 mg weekly.  Follow-up in 3 months for repeat hemoglobin A1c. ?

## 2021-07-22 ENCOUNTER — Other Ambulatory Visit: Payer: Self-pay | Admitting: Family Medicine

## 2021-07-22 DIAGNOSIS — I10 Essential (primary) hypertension: Secondary | ICD-10-CM

## 2021-07-24 ENCOUNTER — Encounter: Payer: Self-pay | Admitting: *Deleted

## 2022-05-15 ENCOUNTER — Other Ambulatory Visit: Payer: Self-pay

## 2022-05-15 DIAGNOSIS — E1159 Type 2 diabetes mellitus with other circulatory complications: Secondary | ICD-10-CM

## 2022-05-16 ENCOUNTER — Other Ambulatory Visit: Payer: Self-pay

## 2022-05-16 DIAGNOSIS — I10 Essential (primary) hypertension: Secondary | ICD-10-CM

## 2022-05-16 MED ORDER — LOSARTAN POTASSIUM 50 MG PO TABS: 50.0000 mg | ORAL_TABLET | Freq: Every day | ORAL | 0 refills | Status: DC

## 2022-05-16 MED ORDER — CHLORTHALIDONE 25 MG PO TABS: 25.0000 mg | ORAL_TABLET | Freq: Every day | ORAL | 1 refills | Status: DC

## 2023-03-26 ENCOUNTER — Encounter: Payer: Self-pay | Admitting: Student

## 2023-03-26 ENCOUNTER — Other Ambulatory Visit: Payer: Self-pay | Admitting: Student

## 2023-03-26 ENCOUNTER — Ambulatory Visit (INDEPENDENT_AMBULATORY_CARE_PROVIDER_SITE_OTHER): Payer: Medicaid Other | Admitting: Student

## 2023-03-26 VITALS — BP 120/70 | HR 107 | Ht 60.0 in | Wt 175.1 lb

## 2023-03-26 DIAGNOSIS — Z0000 Encounter for general adult medical examination without abnormal findings: Secondary | ICD-10-CM

## 2023-03-26 DIAGNOSIS — E785 Hyperlipidemia, unspecified: Secondary | ICD-10-CM | POA: Diagnosis not present

## 2023-03-26 DIAGNOSIS — Z1211 Encounter for screening for malignant neoplasm of colon: Secondary | ICD-10-CM

## 2023-03-26 DIAGNOSIS — Z1231 Encounter for screening mammogram for malignant neoplasm of breast: Secondary | ICD-10-CM

## 2023-03-26 DIAGNOSIS — I10 Essential (primary) hypertension: Secondary | ICD-10-CM | POA: Diagnosis not present

## 2023-03-26 DIAGNOSIS — Z09 Encounter for follow-up examination after completed treatment for conditions other than malignant neoplasm: Secondary | ICD-10-CM

## 2023-03-26 DIAGNOSIS — A6000 Herpesviral infection of urogenital system, unspecified: Secondary | ICD-10-CM | POA: Diagnosis not present

## 2023-03-26 DIAGNOSIS — E039 Hypothyroidism, unspecified: Secondary | ICD-10-CM | POA: Diagnosis not present

## 2023-03-26 DIAGNOSIS — E119 Type 2 diabetes mellitus without complications: Secondary | ICD-10-CM

## 2023-03-26 DIAGNOSIS — E1169 Type 2 diabetes mellitus with other specified complication: Secondary | ICD-10-CM

## 2023-03-26 LAB — POCT GLYCOSYLATED HEMOGLOBIN (HGB A1C): HbA1c, POC (controlled diabetic range): 12.9 % — AB (ref 0.0–7.0)

## 2023-03-26 MED ORDER — VALACYCLOVIR HCL 500 MG PO TABS: 500.0000 mg | ORAL_TABLET | Freq: Every day | ORAL | 12 refills | Status: AC

## 2023-03-26 MED ORDER — IBUPROFEN 600 MG PO TABS: 600.0000 mg | ORAL_TABLET | Freq: Three times a day (TID) | ORAL | 0 refills | Status: AC | PRN

## 2023-03-26 MED ORDER — METFORMIN HCL ER 500 MG PO TB24: 500.0000 mg | ORAL_TABLET | Freq: Two times a day (BID) | ORAL | 3 refills | Status: DC

## 2023-03-26 MED ORDER — ATORVASTATIN CALCIUM 80 MG PO TABS: ORAL_TABLET | ORAL | 3 refills | Status: AC

## 2023-03-26 NOTE — Assessment & Plan Note (Signed)
 Check TSH today. -Refilled 137 mcg Synthroid 

## 2023-03-26 NOTE — Assessment & Plan Note (Signed)
 Check A1C. Hold jardiance  and Trulicity  pending A1C and follow-up. -Restart metformin .

## 2023-03-26 NOTE — Assessment & Plan Note (Signed)
 At goal. Hold BP meds. Recheck at next visit. -BMP

## 2023-03-26 NOTE — Assessment & Plan Note (Signed)
 Check lipid panel -Restart Lipitor 80 mg

## 2023-03-26 NOTE — Progress Notes (Signed)
    SUBJECTIVE:   Chief compliant/HPI: annual examination  Valerie Bauer is a 53 y.o. who presents today for an annual exam.   HTN Currently at goal. Not taking BP medications.  T2DM  HLD  Not taking her medications d/t prior insurance issues, has been without these medications for several months. Was taking Jardiance , metformin , and Trulicity    Hypothyroidism History of Graves' disease with radioactive iodine intervention. Last TSH >8.0. Not taking her syntrhoid d/t being out of medication   OBJECTIVE:   BP 120/70   Pulse (!) 107   Ht 5' (1.524 m)   Wt 175 lb 2 oz (79.4 kg)   LMP 01/30/2019   SpO2 98%   BMI 34.20 kg/m    General: NAD, pleasant Cardio: RRR, no MRG. Cap Refill <2s. Respiratory: CTAB, normal wob on RA GI: Abdomen is soft, not tender, not distended. BS present Skin: Warm and dry  ASSESSMENT/PLAN:   Assessment & Plan Annual physical exam PHQ score 0, reviewed and discussed.  BP reviewed and at goal.  Advance directives discussion: not discussed today  Considered the following items based upon USPSTF recommendations: Diabetes screening: ordered Screening for elevated cholesterol: ordered HIV testing: Deferred Hepatitis C: Deferred Hepatitis B: Deferred Syphilis if at high risk: deferred GC/CT deferred  Cervical cancer screening: not indicated given history of hysterectomy with prior normal cytology.  Breast cancer screening: discussed and ordered mammogram based upon personal history  Colorectal cancer screening: discussed, colonoscopy ordered Vaccinations: Patient declined Flu,Covid and Prevnar.  Hypothyroidism (acquired) Check TSH today. -Refilled 137 mcg Synthroid  Primary hypertension At goal. Hold BP meds. Recheck at next visit. -BMP Type 2 diabetes mellitus without complication, without long-term current use of insulin  (HCC) Check A1C. Hold jardiance  and Trulicity  pending A1C and follow-up. -Restart metformin . 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 -Restart Valtrex  ppx per patient preference -Ibuprofen  PRN for outbreaks per patient preference   Follow-up recommendations Ask about advanced directives Ask about Prevnar20, she declined today d/t illness. Restart diabetes medications pending lab work   Valerie Church, DO [facility]

## 2023-03-26 NOTE — Patient Instructions (Addendum)
 It was great to see you! Thank you for allowing me to participate in your care!   I recommend that you always bring your medications to each appointment as this makes it easy to ensure we are on the correct medications and helps us  not miss when refills are needed.  Our plans for today:  - Please schedule an appointment in 2-4 weeks - Please take your metformin , lipitor, and Valtrex   I recommend you undergo a mammogram.   You can call to schedule an appointment by calling [phone].   Directions [address] [place], [place] [address]  Please let me know if you have questions. I will send you a letter or call you with results.    We are checking some labs today, I will call you if they are abnormal will send you a MyChart message or a letter if they are normal.  If you do not hear about your labs in the next 2 weeks please let us  know.  Take care and seek immediate care sooner if you develop any concerns. Please remember to show up 15 minutes before your scheduled appointment time!  [name], DO [facility]

## 2023-03-27 LAB — LIPID PANEL
Chol/HDL Ratio: 4.2 {ratio} (ref 0.0–4.4)
Cholesterol, Total: 205 mg/dL — ABNORMAL HIGH (ref 100–199)
HDL: 49 mg/dL (ref >39)
LDL Chol Calc (NIH): 137 mg/dL — ABNORMAL HIGH (ref 0–99)
Triglycerides: 107 mg/dL (ref 0–149)
VLDL Cholesterol Cal: 19 mg/dL (ref 5–40)

## 2023-03-27 LAB — BASIC METABOLIC PANEL
BUN/Creatinine Ratio: 11 (ref 9–23)
BUN: 10 mg/dL (ref 6–24)
CO2: 18 mmol/L — ABNORMAL LOW (ref 20–29)
Calcium: 8.8 mg/dL (ref 8.7–10.2)
Chloride: 101 mmol/L (ref 96–106)
Creatinine, Ser: 0.89 mg/dL (ref 0.57–1.00)
Glucose: 407 mg/dL — ABNORMAL HIGH (ref 70–99)
Potassium: 4.4 mmol/L (ref 3.5–5.2)
Sodium: 139 mmol/L (ref 134–144)
eGFR: 78 mL/min/{1.73_m2} (ref >59)

## 2023-03-27 LAB — T4F: T4,Free (Direct): 0.46 ng/dL — ABNORMAL LOW (ref 0.82–1.77)

## 2023-03-27 LAB — MICROALBUMIN / CREATININE URINE RATIO
Creatinine, Urine: 85 mg/dL
Microalb/Creat Ratio: 70 mg/g{creat} — ABNORMAL HIGH (ref 0–29)
Microalbumin, Urine: 59.5 ug/mL

## 2023-03-27 LAB — TSH RFX ON ABNORMAL TO FREE T4: TSH: 5.08 u[IU]/mL — ABNORMAL HIGH (ref 0.450–4.500)

## 2023-03-28 ENCOUNTER — Telehealth: Payer: Self-pay | Admitting: Student

## 2023-03-28 DIAGNOSIS — E039 Hypothyroidism, unspecified: Secondary | ICD-10-CM

## 2023-03-28 DIAGNOSIS — E1159 Type 2 diabetes mellitus with other circulatory complications: Secondary | ICD-10-CM

## 2023-03-28 DIAGNOSIS — E1129 Type 2 diabetes mellitus with other diabetic kidney complication: Secondary | ICD-10-CM

## 2023-03-28 MED ORDER — LOSARTAN POTASSIUM 50 MG PO TABS: 50.0000 mg | ORAL_TABLET | Freq: Every day | ORAL | 0 refills | Status: DC

## 2023-03-28 MED ORDER — LEVOTHYROXINE SODIUM 125 MCG PO TABS: 125.0000 ug | ORAL_TABLET | Freq: Every day | ORAL | 1 refills | Status: DC

## 2023-03-28 NOTE — Telephone Encounter (Signed)
 Called patient, confirmed identity.  Discussed elevated protein creatinine ratio, patient agreeable to restarting her losartan .  Discussed elevated TSH, patient has been without her Synthroid .  Patient agreeable to restarting Synthroid .  Plan: Losartan  50 mg daily Synthroid  125 mcg daily Follow-up in ~ 4 weeks

## 2023-03-30 ENCOUNTER — Ambulatory Visit
Admission: EM | Admit: 2023-03-30 | Discharge: 2023-03-30 | Disposition: A | Discharge status: Home / Self Care | Payer: Medicaid Other | Attending: Physician Assistant | Admitting: Physician Assistant

## 2023-03-30 DIAGNOSIS — J029 Acute pharyngitis, unspecified: Secondary | ICD-10-CM

## 2023-03-30 LAB — POCT RAPID STREP A (OFFICE): Rapid Strep A Screen: NEGATIVE

## 2023-03-30 NOTE — ED Triage Notes (Signed)
 Pt states it hurts when she swallows in throat and ear. Symptoms onset 03/28/23. No fever that she is aware of.

## 2023-03-30 NOTE — Discharge Instructions (Signed)
 Return if any problems.

## 2023-03-31 NOTE — ED Provider Notes (Signed)
 [facility]    CSN: [id number] Arrival date & time: 03/30/23  9167      History   Chief Complaint Chief Complaint  Patient presents with   Sore Throat   Otalgia    HPI Valerie Bauer is a 53 y.o. female.   Patient complains of a sore throat for the past 2 days.  Patient reports that she has pain that radiates into her ears.  Patient denies any fever or chills she has not had any cough or congestion.   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   Axillary lymphadenopathy 11/30/2019   Discharge from breast 11/30/2019   Type 2 diabetes mellitus without complication, without long-term current use of insulin  (HCC) 12/25/2018   Hypothyroidism (acquired) 04/01/2018   Hyperlipidemia associated with type 2 diabetes mellitus (HCC) 07/18/2016   Obesity (BMI 30-39.9) 12/21/2007   Primary hypertension 12/21/2007    Past Surgical History:  Procedure Laterality Date   BREAST BIOPSY Left 11/20/2009   BREAST DUCTAL SYSTEM EXCISION Left 04/11/2021   Procedure: LEFT NIPPLE DUCT EXCISION;  Surgeon: Belinda Cough, MD;  Location: [facility];  Service: General;  Laterality: Left;   BREAST LUMPECTOMY WITH RADIOACTIVE SEED LOCALIZATION Left 04/11/2021   Procedure: LEFT BREAST LUMPECTOMY WITH RADIOACTIVE SEED LOCALIZATION;  Surgeon: Belinda Cough, MD;  Location: [facility];  Service: General;  Laterality: Left;   CRYOABLATION  2014   cervical cryoablation ~ 2014   HYSTERECTOMY ABDOMINAL WITH SALPINGECTOMY Bilateral 02/09/2019   Procedure: HYSTERECTOMY  ABDOMINAL WITH SALPINGECTOMY;  Surgeon: Nicholaus Burnard HERO, MD;  Location: [facility] OR;  Service: Gynecology;  Laterality: Bilateral;   IR RADIOLOGIST EVAL & MGMT  12/23/2018   TUBAL LIGATION  1999    OB History     Gravida  3   Para  3   Term  3   Preterm      AB      Living  3      SAB      IAB      Ectopic      Multiple      Live Births  3            Home Medications    Prior to Admission medications   Medication Sig Start Date End Date Taking? Authorizing Provider  atorvastatin  (LIPITOR) 80 MG tablet Take 1 tablet by mouth daily (80 mg) 03/26/23   Howell Lunger, DO  blood glucose meter kit and supplies KIT Dispense based on patient and insurance preference. Use up to four times daily as directed. (FOR ICD-9 250.00, 250.01). For QAC - HS accuchecks. 09/24/18   Singh, Prashant K, MD  blood glucose meter kit and supplies KIT Dispense based on patient and insurance preference. Use up to four times daily as directed. 04/24/21   Mahoney, Caitlin, MD  chlorthalidone  (HYGROTON ) 25 MG tablet Take 1 tablet (25 mg total) by mouth daily. 05/16/22   Howell Lunger, DO  Dulaglutide  (TRULICITY ) 1.5 MG/0.5ML SOPN Inject 1.5 mg into the  skin once a week. 06/29/21   Cresenzo, Norleen GAILS, MD  empagliflozin  (JARDIANCE ) 25 MG TABS tablet TAKE 1 TABLET BY MOUTH ONCE DAILY . APPOINTMENT REQUIRED FOR FUTURE REFILLS 06/29/21   Cresenzo, John V, MD  glucose blood (FREESTYLE LITE) test strip For glucose testing every before meals at bedtime. Diagnosis E 11.65  Can substitute to any accepted brand 09/24/18   Singh, Prashant K, MD  glucose blood (ONETOUCH VERIO) test strip Use up to twice daily testing. E11.9 04/01/18   Ladell Lenis, DO  ibuprofen  (ADVIL ) 600 MG tablet Take 1 tablet (600 mg total) by mouth every 8 (eight) hours as needed. 03/26/23   Howell Lunger, DO  Lancet Devices (ONE TOUCH DELICA LANCING DEV) MISC Use up to twice daily testing. E11.9 07/18/16   Scarlet Elsie LABOR, MD  levothyroxine   (SYNTHROID ) 125 MCG tablet Take 1 tablet (125 mcg total) by mouth daily before breakfast. 03/28/23   Howell Lunger, DO  losartan  (COZAAR ) 50 MG tablet Take 1 tablet (50 mg total) by mouth daily. NEEDS APPT FOR REFILLS 03/28/23   Howell Lunger, DO  metFORMIN  (GLUCOPHAGE -XR) 500 MG 24 hr tablet Take 1 tablet (500 mg total) by mouth in the morning and at bedtime. 03/26/23   Howell Lunger, DO  ONETOUCH DELICA LANCETS 33G MISC Use up to twice daily testing. E11.9 04/01/18   Ladell Lenis, DO  valACYclovir  (VALTREX ) 500 MG tablet Take 1 tablet (500 mg total) by mouth daily. Can increase to twice a day for 5 days in the event of a recurrence 03/26/23   Howell Lunger, DO    Family History Family History  Problem Relation Age of Onset   Breast cancer Mother 45   Prostate cancer Father 64    Social History Social History   Tobacco Use   Smoking status: Never   Smokeless tobacco: Never  Vaping Use   Vaping status: Never Used  Substance Use Topics   Alcohol use: No   Drug use: No     Allergies   Patient has no known allergies.   Review of Systems Review of Systems  HENT:  Positive for ear pain.   All other systems reviewed and are negative.    Physical Exam Triage Vital Signs ED Triage Vitals  Encounter Vitals Group     BP 03/30/23 0930 (!) 148/85     Systolic BP Percentile --      Diastolic BP Percentile --      Pulse Rate 03/30/23 0930 80     Resp 03/30/23 0930 18     Temp 03/30/23 0930 98.2 F (36.8 C)     Temp Source 03/30/23 0930 Oral     SpO2 03/30/23 0930 97 %     Weight --      Height --      Head Circumference --      Peak Flow --      Pain Score 03/30/23 0929 8     Pain Loc --      Pain Education --      Exclude from Growth Chart --    No data found.  Updated Vital Signs BP (!) 148/85 (BP Location: Left Arm)   Pulse 80   Temp 98.2 F (36.8 C) (Oral)   Resp 18   LMP 01/30/2019   SpO2 97%   Visual Acuity Right Eye Distance:   Left Eye Distance:    Bilateral Distance:    Right Eye Near:   Left Eye Near:  Bilateral Near:     Physical Exam Vitals and nursing note reviewed.  Constitutional:      Appearance: She is well-developed.  HENT:     Head: Normocephalic.     Mouth/Throat:     Pharynx: Posterior oropharyngeal erythema present. No pharyngeal swelling.  Cardiovascular:     Rate and Rhythm: Normal rate.  Pulmonary:     Effort: Pulmonary effort is normal.  Abdominal:     General: There is no distension.  Musculoskeletal:        General: Normal range of motion.     Cervical back: Normal range of motion.  Skin:    General: Skin is warm.  Neurological:     General: No focal deficit present.     Mental Status: She is alert and oriented to person, place, and time.      UC Treatments / Results  Labs (all labs ordered are listed, but only abnormal results are displayed) Labs Reviewed  POCT RAPID STREP A (OFFICE) - Normal    EKG   Radiology No results found.  Procedures Procedures (including critical care time)  Medications Ordered in UC Medications - No data to display  Initial Impression / Assessment and Plan / UC Course  I have reviewed the triage vital signs and the nursing notes.  Pertinent labs & imaging results that were available during my care of the patient were reviewed by me and considered in my medical decision making (see chart for details).     Strep screen is negative.  Patient is counseled on symptomatic care. Final Clinical Impressions(s) / UC Diagnoses   Final diagnoses:  Acute pharyngitis, unspecified etiology     Discharge Instructions      Return if any problems.    ED Prescriptions   None   An After Visit Summary was printed and given to the patient.     PDMP not reviewed this encounter.   Flint Sonny POUR, PA-C 03/31/23 [phone]

## 2023-04-29 ENCOUNTER — Other Ambulatory Visit: Payer: Self-pay | Admitting: Family Medicine

## 2023-04-29 ENCOUNTER — Ambulatory Visit
Admission: RE | Admit: 2023-04-29 | Discharge: 2023-04-29 | Disposition: A | Discharge status: Home / Self Care | Payer: Medicaid Other | Source: Ambulatory Visit | Attending: Family Medicine

## 2023-04-29 DIAGNOSIS — Z1231 Encounter for screening mammogram for malignant neoplasm of breast: Secondary | ICD-10-CM

## 2023-04-29 DIAGNOSIS — R921 Mammographic calcification found on diagnostic imaging of breast: Secondary | ICD-10-CM

## 2023-05-05 ENCOUNTER — Ambulatory Visit
Admission: RE | Admit: 2023-05-05 | Discharge: 2023-05-05 | Disposition: A | Discharge status: Home / Self Care | Source: Ambulatory Visit | Attending: Family Medicine | Admitting: Family Medicine

## 2023-05-05 DIAGNOSIS — R921 Mammographic calcification found on diagnostic imaging of breast: Secondary | ICD-10-CM

## 2023-05-05 HISTORY — PX: BREAST BIOPSY: SHX20

## 2023-05-06 LAB — SURGICAL PATHOLOGY

## 2023-05-09 ENCOUNTER — Encounter: Payer: Self-pay | Admitting: Gastroenterology

## 2023-05-13 ENCOUNTER — Ambulatory Visit (AMBULATORY_SURGERY_CENTER): Admitting: *Deleted

## 2023-05-13 VITALS — Ht 60.0 in | Wt 170.0 lb

## 2023-05-13 DIAGNOSIS — Z800 Family history of malignant neoplasm of digestive organs: Secondary | ICD-10-CM

## 2023-05-13 DIAGNOSIS — Z1211 Encounter for screening for malignant neoplasm of colon: Secondary | ICD-10-CM

## 2023-05-13 MED ORDER — SUFLAVE 178.7 G PO SOLR: 1.0000 | Freq: Once | ORAL | 0 refills | Status: AC

## 2023-05-13 NOTE — Progress Notes (Signed)
 Pt's name and DOB verified at the beginning of the pre-visit wit 2 identifiers   Pt denies any difficulty with ambulating,sitting, laying down or rolling side to side  Pt has no issues with ambulation   Pt has no issues moving head neck or swallowing  Pt states she was hard to wake up after anesthesia for hysterectomy  Pt denies having issues being intubated  No FH of Malignant Hyperthermia  Pt is not on diet pills or shots  Pt is not on home 02   Pt is not on blood thinners   Pt denies issues with constipation   Pt is not on dialysis  Pt denise any abnormal heart rhythms   Pt denies any upcoming cardiac testing  Chart not reviewed by CRNA prior to PV  Visit by phone  Pt states weight is 170 lb  IInstructions reviewed. Pt given [facility] main # and MD on call # prior to instructions.  Pt states understanding of instructions. Instructed to review again prior to procedure. Pt states they will.   Informed pt that they will receive a text or  call from [facility] regarding there prep med.

## 2023-05-27 ENCOUNTER — Encounter: Payer: Self-pay | Admitting: Gastroenterology

## 2023-05-27 ENCOUNTER — Ambulatory Visit (AMBULATORY_SURGERY_CENTER): Admitting: Gastroenterology

## 2023-05-27 VITALS — BP 156/96 | HR 67 | Temp 97.5°F | Resp 9 | Ht 60.0 in | Wt 170.0 lb

## 2023-05-27 DIAGNOSIS — K644 Residual hemorrhoidal skin tags: Secondary | ICD-10-CM

## 2023-05-27 DIAGNOSIS — K5730 Diverticulosis of large intestine without perforation or abscess without bleeding: Secondary | ICD-10-CM

## 2023-05-27 DIAGNOSIS — D124 Benign neoplasm of descending colon: Secondary | ICD-10-CM

## 2023-05-27 DIAGNOSIS — Z1211 Encounter for screening for malignant neoplasm of colon: Secondary | ICD-10-CM

## 2023-05-27 DIAGNOSIS — Z800 Family history of malignant neoplasm of digestive organs: Secondary | ICD-10-CM

## 2023-05-27 DIAGNOSIS — K641 Second degree hemorrhoids: Secondary | ICD-10-CM

## 2023-05-27 MED ORDER — SODIUM CHLORIDE 0.9 % IV SOLN: 500.0000 mL | Freq: Once | INTRAVENOUS | Status: DC

## 2023-05-27 NOTE — Progress Notes (Unsigned)
 Pt's states no medical or surgical changes since previsit or office visit.

## 2023-05-27 NOTE — Progress Notes (Unsigned)
 GASTROENTEROLOGY PROCEDURE H&P NOTE   Primary Care Physician: Tiffany Kocher, DO  HPI: Valerie Bauer is a 53 y.o. female who presents for Colonoscopy for screening.  Past Medical History:  Diagnosis Date   Abnormal uterine bleeding (AUB)    Diabetes mellitus without complication (HCC) 2018   Fibroids    Uterine   HSV infection 2011   dx 2011   Hypertension 2017   Hypothyroidism    Pelvic pain    Pneumonia    Past Surgical History:  Procedure Laterality Date   BREAST BIOPSY Left 11/20/2009   BREAST BIOPSY Right 05/05/2023   MM RT BREAST BX W LOC DEV 1ST LESION IMAGE BX SPEC STEREO GUIDE 05/05/2023 [facility]   BREAST DUCTAL SYSTEM EXCISION Left 04/11/2021   Procedure: LEFT NIPPLE DUCT EXCISION;  Surgeon: Manus Rudd, MD;  Location: [facility];  Service: General;  Laterality: Left;   BREAST LUMPECTOMY WITH RADIOACTIVE SEED LOCALIZATION Left 04/11/2021   Procedure: LEFT BREAST LUMPECTOMY WITH RADIOACTIVE SEED LOCALIZATION;  Surgeon: Manus Rudd, MD;  Location: [facility];  Service: General;  Laterality: Left;   CRYOABLATION  2014   cervical cryoablation ~ 2014   HYSTERECTOMY ABDOMINAL WITH SALPINGECTOMY Bilateral 02/09/2019   Procedure: HYSTERECTOMY ABDOMINAL WITH SALPINGECTOMY;  Surgeon: Conan Bowens, MD;  Location: [facility];  Service: Gynecology;  Laterality: Bilateral;   IR RADIOLOGIST EVAL & MGMT  12/23/2018   TUBAL LIGATION  1999   Current Outpatient Medications  Medication Sig Dispense Refill   atorvastatin (LIPITOR) 80 MG tablet Take 1 tablet by mouth daily (80 mg) 90 tablet 3   blood glucose meter kit and supplies KIT Dispense based on patient and insurance preference. Use up to four times daily as directed. (FOR ICD-9 250.00, 250.01). For QAC - HS accuchecks. 1 each 1   blood glucose meter kit and supplies KIT Dispense based on patient and insurance preference. Use up to four times daily as directed. 1 each 0   levothyroxine  (SYNTHROID) 125 MCG tablet Take 1 tablet (125 mcg total) by mouth daily before breakfast. 30 tablet 1   losartan (COZAAR) 50 MG tablet Take 1 tablet (50 mg total) by mouth daily. NEEDS APPT FOR REFILLS 90 tablet 0   metFORMIN (GLUCOPHAGE-XR) 500 MG 24 hr tablet Take 1 tablet (500 mg total) by mouth in the morning and at bedtime. 180 tablet 3   chlorthalidone (HYGROTON) 25 MG tablet Take 1 tablet (25 mg total) by mouth daily. (Patient not taking: Reported on 05/27/2023) 30 tablet 1   Dulaglutide (TRULICITY) 1.5 MG/0.5ML SOPN Inject 1.5 mg into the skin once a week. (Patient not taking: Reported on 05/27/2023) 6.42 mL 2   empagliflozin (JARDIANCE) 25 MG TABS tablet TAKE 1 TABLET BY MOUTH ONCE DAILY . APPOINTMENT REQUIRED FOR FUTURE REFILLS (Patient not taking: Reported on 05/27/2023) 30 tablet 1   glucose blood (FREESTYLE LITE) test strip For glucose testing every before meals at bedtime. Diagnosis E 11.65  Can substitute to any accepted brand (Patient not taking: Reported on 05/13/2023) 100 each 0   glucose blood (ONETOUCH VERIO) test strip Use up to twice daily testing. E11.9 (Patient not taking: Reported on 05/13/2023) 100 each 12   ibuprofen (ADVIL) 600 MG tablet Take 1 tablet (600 mg total) by mouth every 8 (eight) hours as needed. 30 tablet 0   Lancet Devices (ONE TOUCH DELICA LANCING DEV) MISC Use up to twice daily testing. E11.9 (Patient not taking: Reported on 05/13/2023) 100 each  3   ONETOUCH DELICA LANCETS 33G MISC Use up to twice daily testing. E11.9 (Patient not taking: Reported on 05/13/2023) 100 each 3   OVER THE COUNTER MEDICATION Women's multiple vitamin     valACYclovir (VALTREX) 500 MG tablet Take 1 tablet (500 mg total) by mouth daily. Can increase to twice a day for 5 days in the event of a recurrence 30 tablet 12   Current Facility-Administered Medications  Medication Dose Route Frequency Provider Last Rate Last Admin   0.9 %  sodium chloride infusion  500 mL Intravenous Once Mansouraty,  Netty Starring., MD        Current Outpatient Medications:    atorvastatin (LIPITOR) 80 MG tablet, Take 1 tablet by mouth daily (80 mg), Disp: 90 tablet, Rfl: 3   blood glucose meter kit and supplies KIT, Dispense based on patient and insurance preference. Use up to four times daily as directed. (FOR ICD-9 250.00, 250.01). For QAC - HS accuchecks., Disp: 1 each, Rfl: 1   blood glucose meter kit and supplies KIT, Dispense based on patient and insurance preference. Use up to four times daily as directed., Disp: 1 each, Rfl: 0   levothyroxine (SYNTHROID) 125 MCG tablet, Take 1 tablet (125 mcg total) by mouth daily before breakfast., Disp: 30 tablet, Rfl: 1   losartan (COZAAR) 50 MG tablet, Take 1 tablet (50 mg total) by mouth daily. NEEDS APPT FOR REFILLS, Disp: 90 tablet, Rfl: 0   metFORMIN (GLUCOPHAGE-XR) 500 MG 24 hr tablet, Take 1 tablet (500 mg total) by mouth in the morning and at bedtime., Disp: 180 tablet, Rfl: 3   chlorthalidone (HYGROTON) 25 MG tablet, Take 1 tablet (25 mg total) by mouth daily. (Patient not taking: Reported on 05/27/2023), Disp: 30 tablet, Rfl: 1   Dulaglutide (TRULICITY) 1.5 MG/0.5ML SOPN, Inject 1.5 mg into the skin once a week. (Patient not taking: Reported on 05/27/2023), Disp: 6.42 mL, Rfl: 2   empagliflozin (JARDIANCE) 25 MG TABS tablet, TAKE 1 TABLET BY MOUTH ONCE DAILY . APPOINTMENT REQUIRED FOR FUTURE REFILLS (Patient not taking: Reported on 05/27/2023), Disp: 30 tablet, Rfl: 1   glucose blood (FREESTYLE LITE) test strip, For glucose testing every before meals at bedtime. Diagnosis E 11.65  Can substitute to any accepted brand (Patient not taking: Reported on 05/13/2023), Disp: 100 each, Rfl: 0   glucose blood (ONETOUCH VERIO) test strip, Use up to twice daily testing. E11.9 (Patient not taking: Reported on 05/13/2023), Disp: 100 each, Rfl: 12   ibuprofen (ADVIL) 600 MG tablet, Take 1 tablet (600 mg total) by mouth every 8 (eight) hours as needed., Disp: 30 tablet, Rfl: 0    Lancet Devices (ONE TOUCH DELICA LANCING DEV) MISC, Use up to twice daily testing. E11.9 (Patient not taking: Reported on 05/13/2023), Disp: 100 each, Rfl: 3   ONETOUCH DELICA LANCETS 33G MISC, Use up to twice daily testing. E11.9 (Patient not taking: Reported on 05/13/2023), Disp: 100 each, Rfl: 3   OVER THE COUNTER MEDICATION, Women's multiple vitamin, Disp: , Rfl:    valACYclovir (VALTREX) 500 MG tablet, Take 1 tablet (500 mg total) by mouth daily. Can increase to twice a day for 5 days in the event of a recurrence, Disp: 30 tablet, Rfl: 12  Current Facility-Administered Medications:    0.9 %  sodium chloride infusion, 500 mL, Intravenous, Once, Mansouraty, Netty Starring., MD No Known Allergies Family History  Problem Relation Age of Onset   Breast cancer Mother 4   Esophageal cancer Father  Prostate cancer Father 71   Prostate cancer Maternal Aunt    Colon cancer Maternal Aunt    Colon polyps Neg Hx    Rectal cancer Neg Hx    Stomach cancer Neg Hx    Social History   Socioeconomic History   Marital status: Single    Spouse name: Not on file   Number of children: Not on file   Years of education: Not on file   Highest education level: Not on file  Occupational History   Not on file  Tobacco Use   Smoking status: Never   Smokeless tobacco: Never  Vaping Use   Vaping status: Never Used  Substance and Sexual Activity   Alcohol use: No   Drug use: No   Sexual activity: Not Currently    Birth control/protection: Surgical    Comment: Hysterectomy  Other Topics Concern   Not on file  Social History Narrative   Engineer, maintenance ([employer]). Three children: 2 daughters, 1 son, 6 grandchildren. She was married once for 4 years. Her children were from previous relationships. She works for [employer]. She lives alone but has family that is close by.   Social Drivers of Corporate investment banker Strain: Not on file  Food Insecurity: Not on file  Transportation Needs: Not on file  Physical  Activity: Not on file  Stress: Not on file  Social Connections: Not on file  Intimate Partner Violence: Not on file    Physical Exam: Today's Vitals   05/27/23 1538  BP: (!) 128/92  Pulse: 73  Temp: (!) 97.5 F (36.4 C)  TempSrc: Temporal  SpO2: 100%  Weight: 170 lb (77.1 kg)  Height: 5' (1.524 m)   Body mass index is 33.2 kg/m. GEN: NAD EYE: Sclerae anicteric ENT: MMM CV: Non-tachycardic GI: Soft, NT/ND NEURO:  Alert & Oriented x 3  Lab Results: No results for input(s): "WBC", "HGB", "HCT", "PLT" in the last 72 hours. BMET No results for input(s): "NA", "K", "CL", "CO2", "GLUCOSE", "BUN", "CREATININE", "CALCIUM" in the last 72 hours. LFT No results for input(s): "PROT", "ALBUMIN", "AST", "ALT", "ALKPHOS", "BILITOT", "BILIDIR", "IBILI" in the last 72 hours. PT/INR No results for input(s): "LABPROT", "INR" in the last 72 hours.   Impression / Plan: This is a 53 y.o.female  who presents for Colonoscopy for screening.   The risks and benefits of endoscopic evaluation/treatment were discussed with the patient and/or family; these include but are not limited to the risk of perforation, infection, bleeding, missed lesions, lack of diagnosis, severe illness requiring hospitalization, as well as anesthesia and sedation related illnesses.  The patient's history has been reviewed, patient examined, no change in status, and deemed stable for procedure.  The patient and/or family is agreeable to proceed.    Valerie Parish, MD [facility] Advanced [facility]oscopy Office # [phone]

## 2023-05-27 NOTE — Progress Notes (Unsigned)
 Sedate, gd SR, tolerated procedure well, VSS, report to RN

## 2023-05-27 NOTE — Op Note (Signed)
 [facility] Patient Name: Valerie Bauer Procedure Date: 05/27/2023 4:24 PM MRN: [id number] Endoscopist: Corliss Parish , MD, [id number] Age: 53 Referring MD:  Date of Birth: March 02, 1970 Gender: Female Account #: [id number] Procedure:                Colonoscopy Indications:              Screening for colorectal malignant neoplasm Medicines:                Monitored Anesthesia Care Procedure:                Pre-Anesthesia Assessment:                           - Prior to the procedure, a History and Physical                            was performed, and patient medications and                            allergies were reviewed. The patient's tolerance of                            previous anesthesia was also reviewed. The risks                            and benefits of the procedure and the sedation                            options and risks were discussed with the patient.                            All questions were answered, and informed consent                            was obtained. Prior Anticoagulants: The patient has                            taken no anticoagulant or antiplatelet agents. ASA                            Grade Assessment: II - A patient with mild systemic                            disease. After reviewing the risks and benefits,                            the patient was deemed in satisfactory condition to                            undergo the procedure.                           After obtaining informed consent, the colonoscope  was passed under direct vision. Throughout the                            procedure, the patient's blood pressure, pulse, and                            oxygen saturations were monitored continuously. The                            Olympus Scope SN: [id number] was introduced through                            the anus and advanced to the 3 cm into the ileum.                            The  colonoscopy was performed without difficulty.                            The patient tolerated the procedure. The quality of                            the bowel preparation was adequate. The terminal                            ileum, ileocecal valve, appendiceal orifice, and                            rectum were photographed. Scope In: 4:44:58 PM Scope Out: 4:59:06 PM Scope Withdrawal Time: 0 hours 11 minutes 43 seconds  Total Procedure Duration: 0 hours 14 minutes 8 seconds  Findings:                 Skin tags were found on perianal exam.                           The digital rectal exam findings include                            hemorrhoids. Pertinent negatives include no                            palpable rectal lesions.                           The terminal ileum and ileocecal valve appeared                            normal.                           A 5 mm polyp was found in the descending colon. The                            polyp was sessile. The polyp was removed with a  cold snare. Resection and retrieval were complete.                           Multiple medium-mouthed and small-mouthed                            diverticula were found in the entire colon.                           Normal mucosa was found in the entire colon                            otherwise.                           Non-bleeding non-thrombosed external and internal                            hemorrhoids were found during retroflexion, during                            perianal exam and during digital exam. The                            hemorrhoids were Grade II (internal hemorrhoids                            that prolapse but reduce spontaneously). Complications:            No immediate complications. Estimated Blood Loss:     Estimated blood loss was minimal. Impression:               - Perianal skin tags found on perianal exam.                            Hemorrhoids  found on digital rectal exam.                           - The examined portion of the ileum was normal.                           - One 5 mm polyp in the descending colon, removed                            with a cold snare. Resected and retrieved.                           - Diverticulosis in the entire examined colon.                           - Normal mucosa in the entire examined colon                            otherwise.                           -  Non-bleeding non-thrombosed external and internal                            hemorrhoids. Recommendation:           - The patient will be observed post-procedure,                            until all discharge criteria are met.                           - Discharge patient to home.                           - Patient has a contact number available for                            emergencies. The signs and symptoms of potential                            delayed complications were discussed with the                            patient. Return to normal activities tomorrow.                            Written discharge instructions were provided to the                            patient.                           - High fiber diet.                           - Use FiberCon 1-2 tablets PO daily.                           - Continue present medications.                           - Await pathology results.                           - Repeat colonoscopy in 5-10 years for surveillance                            based on pathology results.                           - The findings and recommendations were discussed                            with the patient.                           - The findings and recommendations were discussed  with the patient's family. Corliss Parish, MD 05/27/2023 5:03:33 PM

## 2023-05-27 NOTE — Patient Instructions (Signed)
 Please read handouts provided. Continue present medications. Await pathology results. High Fiber Diet. FiberCon 1-2 tablets daily.   YOU HAD AN ENDOSCOPIC PROCEDURE TODAY AT THE [facility]:   Refer to the procedure report that was given to you for any specific questions about what was found during the examination.  If the procedure report does not answer your questions, please call your gastroenterologist to clarify.  If you requested that your care partner not be given the details of your procedure findings, then the procedure report has been included in a sealed envelope for you to review at your convenience later.  YOU SHOULD EXPECT: Some feelings of bloating in the abdomen. Passage of more gas than usual.  Walking can help get rid of the air that was put into your GI tract during the procedure and reduce the bloating. If you had a lower endoscopy (such as a colonoscopy or flexible sigmoidoscopy) you may notice spotting of blood in your stool or on the toilet paper. If you underwent a bowel prep for your procedure, you may not have a normal bowel movement for a few days.  Please Note:  You might notice some irritation and congestion in your nose or some drainage.  This is from the oxygen used during your procedure.  There is no need for concern and it should clear up in a day or so.  SYMPTOMS TO REPORT IMMEDIATELY:  Following lower endoscopy (colonoscopy or flexible sigmoidoscopy):  Excessive amounts of blood in the stool  Significant tenderness or worsening of abdominal pains  Swelling of the abdomen that is new, acute  Fever of 100F or higher.  For urgent or emergent issues, a gastroenterologist can be reached at any hour by calling [phone]. Do not use MyChart messaging for urgent concerns.    DIET:  We do recommend a small meal at first, but then you may proceed to your regular diet.  Drink plenty of fluids but you should avoid alcoholic beverages for 24  hours.  ACTIVITY:  You should plan to take it easy for the rest of today and you should NOT DRIVE or use heavy machinery until tomorrow (because of the sedation medicines used during the test).    FOLLOW UP: Our staff will call the number listed on your records the next business day following your procedure.  We will call around 7:15- 8:00 am to check on you and address any questions or concerns that you may have regarding the information given to you following your procedure. If we do not reach you, we will leave a message.     If any biopsies were taken you will be contacted by phone or by letter within the next 1-3 weeks.  Please call us at [phone] if you have not heard about the biopsies in 3 weeks.    SIGNATURES/CONFIDENTIALITY: You and/or your care partner have signed paperwork which will be entered into your electronic medical record.  These signatures attest to the fact that that the information above on your After Visit Summary has been reviewed and is understood.  Full responsibility of the confidentiality of this discharge information lies with you and/or your care-partner.

## 2023-05-27 NOTE — Progress Notes (Signed)
 Called to room to assist during endoscopic procedure.  Patient ID and intended procedure confirmed with present staff. Received instructions for my participation in the procedure from the performing physician.

## 2023-05-28 ENCOUNTER — Telehealth: Payer: Self-pay

## 2023-05-28 NOTE — Telephone Encounter (Signed)
  Follow up Call-     05/27/2023    3:25 PM  Call back number  Post procedure Call Back phone  # [phone]  Permission to leave phone message Yes     Patient questions:  Do you have a fever, pain , or abdominal swelling? No. Pain Score  0 *  Have you tolerated food without any problems? Yes.  Have you been able to return to your normal activities? Yes.    Do you have any questions about your discharge instructions: Diet   No. Medications  No. Follow up visit  No.  Do you have questions or concerns about your Care? No.  Actions: * If pain score is 4 or above: No action needed, pain <4.

## 2023-06-02 LAB — SURGICAL PATHOLOGY

## 2023-06-05 ENCOUNTER — Encounter: Payer: Self-pay | Admitting: Gastroenterology

## 2023-06-30 ENCOUNTER — Ambulatory Visit: Admitting: Student

## 2023-06-30 ENCOUNTER — Telehealth: Payer: Self-pay

## 2023-06-30 ENCOUNTER — Encounter: Payer: Self-pay | Admitting: Student

## 2023-06-30 VITALS — BP 177/103 | HR 89 | Ht 60.0 in | Wt 177.8 lb

## 2023-06-30 DIAGNOSIS — R809 Proteinuria, unspecified: Secondary | ICD-10-CM

## 2023-06-30 DIAGNOSIS — L304 Erythema intertrigo: Secondary | ICD-10-CM | POA: Diagnosis not present

## 2023-06-30 DIAGNOSIS — I152 Hypertension secondary to endocrine disorders: Secondary | ICD-10-CM | POA: Diagnosis not present

## 2023-06-30 DIAGNOSIS — E1159 Type 2 diabetes mellitus with other circulatory complications: Secondary | ICD-10-CM

## 2023-06-30 DIAGNOSIS — E119 Type 2 diabetes mellitus without complications: Secondary | ICD-10-CM

## 2023-06-30 DIAGNOSIS — E039 Hypothyroidism, unspecified: Secondary | ICD-10-CM | POA: Diagnosis not present

## 2023-06-30 DIAGNOSIS — E1129 Type 2 diabetes mellitus with other diabetic kidney complication: Secondary | ICD-10-CM

## 2023-06-30 LAB — POCT GLYCOSYLATED HEMOGLOBIN (HGB A1C): HbA1c, POC (controlled diabetic range): 12.7 % — AB (ref 0.0–7.0)

## 2023-06-30 MED ORDER — LOSARTAN POTASSIUM 100 MG PO TABS
100.0000 mg | ORAL_TABLET | Freq: Every day | ORAL | 1 refills | Status: DC
Start: 2023-06-30 — End: 2023-07-28

## 2023-06-30 MED ORDER — NYSTATIN 100000 UNIT/GM EX POWD: 1.0000 | Freq: Three times a day (TID) | CUTANEOUS | 0 refills | Status: AC

## 2023-06-30 MED ORDER — TRULICITY 0.75 MG/0.5ML ~~LOC~~ SOAJ: 0.7500 mg | SUBCUTANEOUS | 1 refills | Status: DC

## 2023-06-30 NOTE — Patient Instructions (Signed)
 It was great to see you! Thank you for allowing me to participate in your care!   I recommend that you always bring your medications to each appointment as this makes it easy to ensure we are on the correct medications and helps us  not miss when refills are needed.  Our plans for today:  - Please take 100 mg of losartan  every day, I sent a new prescription.  Pick this prescription up today - I have sent a prescription form Trulicity , please take this weekly. - I have also sent a referral to our pharmacy team, they can help get libre glucose monitor - I have sent a prescription for nystatin powder, please put this under your breast up to 3 times a day.  Is very important to keep the skin as dry as possible.  I recommend changing your bra or when you are able to not wearing a bra to allow for healing under the breast. - We are checking some labs today, I will call you if they are abnormal will send you a MyChart message or a letter if they are normal.  If you do not hear about your labs in the next 2 weeks please let us  know. - Please follow-up in 2-4 weeks for blood pressure recheck  Take care and seek immediate care sooner if you develop any concerns. Please remember to show up 15 minutes before your scheduled appointment time!  Lavada Porteous, DO [facility]

## 2023-06-30 NOTE — Progress Notes (Signed)
    SUBJECTIVE:   CHIEF COMPLAINT / HPI:   Hypertension At goal at previous visit.  Currently not at goal, however did not take medications.  Taking losartan  50 mg tablet daily.  Hypothyroidism History of grade disease with radioactive iodine intervention.  Patient had not been taking her Synthroid  medication at last visit in February.  Repeat TSH was 5.0-and patient was restarted on Synthroid .  She reports**compliant with Synthroid .  Type 2 diabetes Last A1c of 12.93 months ago.  Current medications include metformin .  She does not take her medication every day.  Not currently taking Jardiance  nor Trulicity .  Intertrigo Patient reports she is having rash under breast bilaterally.  Trying to keep the area clean.  Pruritic, not painful.  OBJECTIVE:   BP (!) 177/103   Pulse 89   Ht 5' (1.524 m)   Wt 177 lb 12.8 oz (80.6 kg)   LMP 01/30/2019   SpO2 98%   BMI 34.72 kg/m    General: NAD, pleasant Cardio: RRR, no MRG Respiratory: CTAB, normal wob on RA Skin: Erythematous, nonscaly, moist appearing rash under breast bilaterally.  ASSESSMENT/PLAN:   Assessment & Plan Type 2 diabetes mellitus without complication, without long-term current use of insulin  (HCC) Poorly controlled, A1c greater than 12.  Previously well-controlled with metformin /Jardiance /Trulicity .  Given A1c level, will not start Jardiance  today. - Start Trulicity  titration - Consider increasing metformin  (did not today due to GI side effects) - Restart Jardiance  if A1c less than 10 - Referral to our pharmacy clinic Hypothyroidism (acquired) Patient now back on Synthroid . - TSH today Hypertension associated with diabetes (HCC) Poorly controlled hypertension. - Increase losartan  to 100 mg daily - Suspect she will need second agent - Follow-up in 2 weeks for recheck Intertrigo -Trial nystatin powder   Follow recommendations Pneumococcal vaccine Needs cervical cancer screening Needs ophthalmology  exam   Valerie Porteous, DO [facility]

## 2023-06-30 NOTE — Assessment & Plan Note (Signed)
 Poorly controlled, A1c greater than 12.  Previously well-controlled with metformin /Jardiance /Trulicity .  Given A1c level, will not start Jardiance  today. - Start Trulicity  titration - Consider increasing metformin  (did not today due to GI side effects) - Restart Jardiance  if A1c less than 10 - Referral to our pharmacy clinic

## 2023-06-30 NOTE — Telephone Encounter (Signed)
 Pharmacy Patient Advocate Encounter  Received notification from [facility] that Prior Authorization for TRULICITY  0.75MG  has been APPROVED from 06/30/23 to 06/1224   PA #/Case ID/Reference #: [id number]

## 2023-06-30 NOTE — Telephone Encounter (Signed)
 Pharmacy Patient Advocate Encounter   Received notification from CoverMyMeds that prior authorization for TRULICITY  0.75MG  is required/requested.   Insurance verification completed.   The patient is insured through [facility] .   PA required; PA submitted to above mentioned insurance via CoverMyMeds Key/confirmation #/EOC [id number]. Status is pending

## 2023-06-30 NOTE — Assessment & Plan Note (Signed)
 Patient now back on Synthroid . - TSH today

## 2023-07-01 ENCOUNTER — Telehealth: Payer: Self-pay | Admitting: Student

## 2023-07-01 DIAGNOSIS — E039 Hypothyroidism, unspecified: Secondary | ICD-10-CM

## 2023-07-01 LAB — TSH: TSH: 12.8 u[IU]/mL — ABNORMAL HIGH (ref 0.450–4.500)

## 2023-07-01 MED ORDER — LEVOTHYROXINE SODIUM 137 MCG PO TABS: 125.0000 ug | ORAL_TABLET | Freq: Every day | ORAL | 1 refills | Status: DC

## 2023-07-01 NOTE — Telephone Encounter (Signed)
 Patient has an appt scheduled for 06/09 @310 

## 2023-07-01 NOTE — Telephone Encounter (Signed)
 Called patient, confirmed identity.  Discussed elevated TSH.  Patient has been without Synthroid  for at least a week.  I do recommend dose elevation, patient is agreeable.  Sent 137 mcg to her pharmacy. Patient inquired about Trulicity  prior authorization-informed patient that this has been approved as of yesterday.  She will pick up this medication ASAP.  Will send message to our clinical staff for patient to have an appointment in 4-6 weeks.

## 2023-07-10 ENCOUNTER — Encounter (INDEPENDENT_AMBULATORY_CARE_PROVIDER_SITE_OTHER): Admitting: Pharmacist

## 2023-07-10 ENCOUNTER — Encounter: Payer: Self-pay | Admitting: Pharmacist

## 2023-07-10 VITALS — BP 148/83 | HR 88 | Wt 178.6 lb

## 2023-07-10 DIAGNOSIS — E119 Type 2 diabetes mellitus without complications: Secondary | ICD-10-CM

## 2023-07-10 LAB — POCT GLYCOSYLATED HEMOGLOBIN (HGB A1C): HbA1c, POC (controlled diabetic range): 11.4 % — AB (ref 0.0–7.0)

## 2023-07-10 NOTE — Patient Instructions (Signed)
 It was nice to see you today!  Your goal blood sugar is 80-130 before eating and less than 180 after eating.  Medication Changes: Continue all other medication the same.   Keep up the good work with diet and exercise. Aim for a diet full of vegetables, fruit and lean meats (chicken, [place], fish). Try to limit salt intake by eating fresh or frozen vegetables (instead of canned), rinse canned vegetables prior to cooking and do not add any additional salt to meals.   Sensor Application If using the App, you can tap Help in the Main Menu to access an in-app tutorial on applying a Sensor. See below for instructions on how to download the app. Apply Sensors only on the back of your upper arm. If placed in other areas, the Sensor may not function properly and could give you inaccurate readings. Avoid areas with scars, moles, stretch marks, or lumps.   Select an area of skin that generally stays flat during your normal daily activities (no bending or folding). Choose a site that is at least 1 inch (2.5 cm) away from any injection sites. To prevent discomfort or skin irritation, you should select a different site other than the one most recently used. Wash application site using a plain soap, dry, and then clean with an alcohol wipe. This will help remove any oily residue that may prevent the sensor from sticking properly. Allow site to air dry before proceeding. Note: The area MUST be clean and dry, or the Sensor may not stay on for the full wear duration specified by your Sensor insert. 4. Unscrew the cap from the Sensor Applicator and set the cap aside.  5. Place the Sensor Applicator over the prepared site and push down firmly to apply the Sensor to your body. 6. Gently pull the Sensor Applicator away from your body. The Sensor should now be attached to your skin. 7. Make sure the Sensor is secure after application. Put the cap back on the Sensor Applicator. Discard the used Engineer, agricultural  according to local regulations.  What If My Sensor Falls Off or What If My Sensor Isn't Working? Call [employer] Customer Care Team at [phone] Available 7 days a week from 8AM-8PM EST, excluding holidays If you have multiple sensors fall, contact [facility] at [phone]   The App Download the FreeStyle [employer] 3 App in your phone's app store   Load the app and select get started now Create an account  Tap scan new sensor Follow the prompts on the screen. If your sensor does not sync, try moving your phone slowly around the sensor. Phone cases may affect scanning. This will be the only time you have to scan the sensor until you apply a new sensor.   There will be a 60 minute start up period until the app will display your glucose reading

## 2023-07-10 NOTE — Research (Signed)
 S:     Chief Complaint  Patient presents with   Medication Management    Diabetes - Liberate Study Enrollment   53 y.o. female who presents for diabetes evaluation, education, and management in the context of the LIBERATE Study.  Today, patient arrives in good spirits and presents without any assistance.  Patient was referred and last seen by Primary Care Provider, Dr. Telford Feather, on 06/30/2023.  At last visit, glucose control was evaluated and diabetes regimen was adjusted.   Majority of visit today was focused on enrollment in trial and setting up CGM.   Patient reports Diabetes was diagnosed 2020.   Current diabetes medications include: Trulicity  (dulaglutide ) 0.75mg  weekly, Metformin  500mg  BID Current hypertension medications include: Losartan  100mg  daily Current hyperlipidemia medications include: atorvastatin  80mg  daily  Patient reports adherence to taking all medications as prescribed recently.  O:   Review of Systems  All other systems reviewed and are negative.   Physical Exam Vitals reviewed.  Constitutional:      Appearance: Normal appearance.  Pulmonary:     Effort: Pulmonary effort is normal.  Neurological:     Mental Status: She is alert.  Psychiatric:        Mood and Affect: Mood normal.        Behavior: Behavior normal.        Thought Content: Thought content normal.        Judgment: Judgment normal.    Lab Results  Component Value Date   HGBA1C 11.4 (A) 07/10/2023   Vitals:   07/10/23 0947 07/10/23 0949  BP: (!) 149/82 (!) 148/83  Pulse: 88    Lipid Panel     Component Value Date/Time   CHOL 205 (H) 03/26/2023 0911   TRIG 107 03/26/2023 0911   HDL 49 03/26/2023 0911   CHOLHDL 4.2 03/26/2023 0911   CHOLHDL 3.3 05/04/2015 0853   VLDL 10 05/04/2015 0853   LDLCALC 137 (H) 03/26/2023 0911    Clinical Atherosclerotic Cardiovascular Disease (ASCVD): No  The 10-year ASCVD risk score (Arnett DK, et al., 2019) is: 19.1%   Values used to  calculate the score:     Age: 31 years     Sex: Female     Is Non-Hispanic African American: Yes     Diabetic: Yes     Tobacco smoker: No     Systolic Blood Pressure: 148 mmHg     Is BP treated: Yes     HDL Cholesterol: 49 mg/dL     Total Cholesterol: 205 mg/dL   Patient is participating in a Managed Medicaid Plan:  Yes   A/P:  LIBERATE Study:  -Patient provided verbal consent to participate in the study. Consent documented in electronic medical record.  -Provided education on Libre 3 CGM. Collaborated to ensure Jerrilyn Moras 3 app was downloaded on patient's phone. Educated on how to place sensor every 14 days, patient placed first sensor correctly and verbalized understanding of use, removal, and how to place next sensor. Discussed alarms. 8 sensors provided for a 3 month supply. Educated to contact the office if the sensor falls off early and replacements are needed before their next [employer].   Diabetes diagnosed in 2020 currently with poor control due to inadequate medication regimen. Patient is able to verbalize appropriate hypoglycemia management plan.  Recently restart Trulicity  (dulaglutide ) and metformin  dose was increased.  -Continued GLP-1 Trulicity  (dulaglutide ) at 0.75mg  weekly - tolerating medication at this time. -Plan to restart  SGLT2-I Jardiance  (empagliflozin ) at 25mg  once  glucose control improves.  -Continued metformin  500mg  BID  -Patient educated on purpose, proper use, and potential adverse effects.  -Extensively discussed pathophysiology of diabetes, recommended lifestyle interventions, dietary effects on blood sugar control.  -Counseled on s/sx of and management of hypoglycemia.  -Next A1c anticipated at 3 month Liberate study visit.   Written patient instructions provided. Patient verbalized understanding of treatment plan.  Total time in face to face counseling 42 minutes.    Follow-up:  Pharmacist 4 weeks. PCP clinic visit in 2 weeks.

## 2023-07-10 NOTE — Assessment & Plan Note (Signed)
 LIBERATE Study: Patient provided verbal consent to participate in the study.  Diabetes diagnosed in 2020 currently with poor control due to inadequate medication regimen. Patient is able to verbalize appropriate hypoglycemia management plan.  Recently restart Trulicity  (dulaglutide ) and metformin  dose was increased.  -Continued GLP-1 Trulicity  (dulaglutide ) at 0.75mg  weekly - tolerating medication at this time..  -Plan to restart  SGLT2-I Jardiance  (empagliflozin ) at 25mg  once glucose control improves. -Continued metformin  500mg  BID  -Patient educated on purpose, proper use, and potential adverse effects.  -Extensively discussed pathophysiology of diabetes, recommended lifestyle interventions, dietary effects on blood sugar control.  -Counseled on s/sx of and management of hypoglycemia.  -Next A1c anticipated at 3 month Liberate study visit.

## 2023-07-28 ENCOUNTER — Ambulatory Visit: Admitting: Student

## 2023-07-28 ENCOUNTER — Encounter: Payer: Self-pay | Admitting: Student

## 2023-07-28 VITALS — BP 146/105 | HR 74 | Ht 60.0 in | Wt 174.0 lb

## 2023-07-28 DIAGNOSIS — E039 Hypothyroidism, unspecified: Secondary | ICD-10-CM

## 2023-07-28 DIAGNOSIS — I10 Essential (primary) hypertension: Secondary | ICD-10-CM | POA: Diagnosis present

## 2023-07-28 DIAGNOSIS — E119 Type 2 diabetes mellitus without complications: Secondary | ICD-10-CM

## 2023-07-28 MED ORDER — LOSARTAN POTASSIUM-HCTZ 100-12.5 MG PO TABS
1.0000 | ORAL_TABLET | Freq: Every day | ORAL | 1 refills | Status: DC
Start: 2023-07-28 — End: 2023-08-18

## 2023-07-28 NOTE — Assessment & Plan Note (Addendum)
 Continue medications as below.  Repeat A1c in August, 2025-adjust regimen as indicated.  Based on GMI from [place], will not make adjustments at this time. - Trulicity  0.5 mg weekly - Metformin  500 mg daily - Follow-up in 2 months for A1c

## 2023-07-28 NOTE — Assessment & Plan Note (Addendum)
 Compliant with medication. - TSH today - Continue Synthroid  137 mcg daily

## 2023-07-28 NOTE — Patient Instructions (Addendum)
 It was great to see you! Thank you for allowing me to participate in your care!   I recommend that you always bring your medications to each appointment as this makes it easy to ensure we are on the correct medications and helps us  not miss when refills are needed.  Our plans for today:  - Please take losartan -hydrochlorothiazide  100 mg - 12.5 mg daily. - We are checking some labs today, I will call you if they are abnormal will send you a MyChart message or a letter if they are normal.  If you do not hear about your labs in the next 2 weeks please let us  know. - Follow-up in 2 weeks for blood pressure recheck  Take care and seek immediate care sooner if you develop any concerns. Please remember to show up 15 minutes before your scheduled appointment time!  Lavada Porteous, DO [facility]

## 2023-07-28 NOTE — Assessment & Plan Note (Addendum)
 Elevated on repeat. - Start losartan -hydrochlorothiazide  100-12.5 mg daily - Follow-up in 2 weeks

## 2023-07-28 NOTE — Progress Notes (Signed)
   SUBJECTIVE:   CHIEF COMPLAINT / HPI:   Hypothyroid History of graves disease with radioactive iodine intervention.  TSH of 12.84 weeks ago 2/2 medication nonadherence.  Based on trend, Synthroid  increased to 137 mcg and refills sent.  Patient reports compliance with medication and no concerns  Hypertension Previously well-controlled blood pressure, however last visit not at goal.  Losartan  was increased to 100 mg daily, patient reports good compliance.  Type 2 diabetes Most recent A1c of 12.7.  Could not restart Jardiance  2/2 A1c.  Patient had been previously well-controlled with GLP-1, metformin  and Jardiance .  Restarted Trulicity , patient reports compliance and minimal side effects.  Continued metformin  500 mg daily. Has Freestyle Jerrilyn Moras now: reports most readings in the green. GMI 7.4 % past 14 days.   OBJECTIVE:   BP (!) 146/105   Pulse 74   Ht 5' (1.524 m)   Wt 174 lb (78.9 kg)   LMP 01/30/2019   SpO2 98%   BMI 33.98 kg/m    General: NAD, pleasant Respiratory: CTAB, normal wob on RA GI: Abdomen is soft, not tender, not distended. BS present Skin: Warm and dry   ASSESSMENT/PLAN:   Assessment & Plan Primary hypertension Elevated on repeat. - Start losartan -hydrochlorothiazide  100-12.5 mg daily - Follow-up in 2 weeks Hypothyroidism (acquired) Compliant with medication. - TSH today - Continue Synthroid  137 mcg daily Type 2 diabetes mellitus without complication, without long-term current use of insulin  (HCC) Continue medications as below.  Repeat A1c in August, 2025-adjust regimen as indicated.  Based on GMI from [place], will not make adjustments at this time. - Trulicity  0.5 mg weekly - Metformin  500 mg daily - Follow-up in 2 months for A1c   Lavada Porteous, DO [facility]

## 2023-07-29 ENCOUNTER — Ambulatory Visit: Payer: Self-pay | Admitting: Student

## 2023-07-29 LAB — TSH: TSH: 2.32 u[IU]/mL (ref 0.450–4.500)

## 2023-08-07 ENCOUNTER — Ambulatory Visit: Admitting: Pharmacist

## 2023-08-07 ENCOUNTER — Encounter: Payer: Self-pay | Admitting: Pharmacist

## 2023-08-07 VITALS — BP 115/74 | HR 102 | Wt 174.0 lb

## 2023-08-07 DIAGNOSIS — E785 Hyperlipidemia, unspecified: Secondary | ICD-10-CM | POA: Diagnosis not present

## 2023-08-07 DIAGNOSIS — E119 Type 2 diabetes mellitus without complications: Secondary | ICD-10-CM

## 2023-08-07 DIAGNOSIS — E1169 Type 2 diabetes mellitus with other specified complication: Secondary | ICD-10-CM | POA: Diagnosis not present

## 2023-08-07 DIAGNOSIS — I10 Essential (primary) hypertension: Secondary | ICD-10-CM | POA: Diagnosis not present

## 2023-08-07 NOTE — Progress Notes (Signed)
 S:     Chief Complaint  Patient presents with   Medication Management    Diabetes - Liberate    53 y.o. female who presents for diabetes evaluation, education, and management. Patient arrives in good spirits and presents without any assistance.    Patient was referred and last seen by Primary Care Provider, Dr. Telford Feather, on 07/28/2023.   Patient reports Diabetes was diagnosed in 2020.   Current diabetes medications include: Trulicity  (dulaglutide ) 0.75mg  weekly, Metformin  500mg  BID Current hypertension medications include: Losartan  100mg  daily Current hyperlipidemia medications include: atorvastatin  80mg  daily  Patient reports adherence to taking all medications as prescribed.   Do you feel that your medications are working for you? yes Have you been experiencing any side effects to the medications prescribed? no  Patient denies hypoglycemic events.    Patient reports nocturia (nighttime urination).  X 1 (improved from previous)   Patient reported dietary habits: Eats 2 meals/day Less Ulyses [employer]: minimal Lunch: light lunch Dinner: largest meal of the day Snacks: chips and pretzels - carrots, brocoli, green apple, watermelon, pineapple Drinks: less juice   O:   Review of Systems  All other systems reviewed and are negative.   Physical Exam Vitals reviewed.  Pulmonary:     Effort: Pulmonary effort is normal.   Neurological:     Mental Status: She is alert.   Psychiatric:        Mood and Affect: Mood normal.        Thought Content: Thought content normal.        Judgment: Judgment normal.     Libre3 CGM Download today 08/07/2023 % Time CGM is active: 96% Average Glucose: 171 mg/dL Glucose Management Indicator: 7.4  Glucose Variability: 21% (goal <36%) Time in Goal:  - Time in range 70-180: 63% - Time above range: 37%  (36% are 180-250) - Time below range: 0% Observed patterns: low variability and greatly improved control  Lab Results   Component Value Date   HGBA1C 11.4 (A) 07/10/2023   Vitals:   08/07/23 1600  BP: 115/74  Pulse: (!) 102  SpO2: 99%    Lipid Panel     Component Value Date/Time   CHOL 205 (H) 03/26/2023 0911   TRIG 107 03/26/2023 0911   HDL 49 03/26/2023 0911   CHOLHDL 4.2 03/26/2023 0911   CHOLHDL 3.3 05/04/2015 0853   VLDL 10 05/04/2015 0853   LDLCALC 137 (H) 03/26/2023 0911    Clinical Atherosclerotic Cardiovascular Disease (ASCVD): No   Patient is participating in a Managed Medicaid Plan:  Yes   A/P: Diabetes diagnosed in 2020 currently much improved control with restart of drug therapy.   -Continued GLP-1 Trulicity  (dulaglutide ) at 0.75mg  weekly - tolerating medication at this time. -Continued metformin  500mg  BID  -Continue with plan to restart  SGLT2-I Jardiance  (empagliflozin ) at 25mg   at future visit.  -Patient educated on purpose, proper use, and potential adverse effects.  -Extensively discussed pathophysiology of diabetes, recommended lifestyle interventions, dietary effects on blood sugar control.     ASCVD risk - primary high intensity statin indicated.  -Continued atorvastatin  80 mg.   Hypertension longstanding currently improved and at goal with recent addition of hydrochlorothiazide  to losartan . Blood pressure goal <130/80 mmHg. Medication adherence good.  -Continued losartan /hydrochlorothiazide  combination 1 pill daily.   Written patient instructions provided. Patient verbalized understanding of treatment plan.  Total time in face to face counseling 25 minutes.    Follow-up:  Pharmacist ~ 8/15 - patient  to schedule PCP clinic visit in PRN

## 2023-08-07 NOTE — Patient Instructions (Signed)
 It was nice to see you today!  Your goal blood sugar is 80-130 before eating and less than 180 after eating.  Medication Changes: Continue all other medication the same.   Keep up the good work with diet and exercise. Aim for a diet full of vegetables, fruit and lean meats (chicken, [place], fish). Try to limit salt intake by eating fresh or frozen vegetables (instead of canned), rinse canned vegetables prior to cooking and do not add any additional salt to meals.   Plan for visit in mid-August OR sooner if your control worsens.

## 2023-08-08 ENCOUNTER — Encounter: Payer: Self-pay | Admitting: Pharmacist

## 2023-08-08 NOTE — Assessment & Plan Note (Signed)
 Hypertension longstanding currently improved and at goal with recent addition of hydrochlorothiazide  to losartan . Blood pressure goal <130/80 mmHg. Medication adherence good.  -Continued losartan /hydrochlorothiazide  combination 1 pill daily.

## 2023-08-08 NOTE — Assessment & Plan Note (Signed)
 Diabetes diagnosed in 2020 currently much improved control with restart of drug therapy.   -Continued GLP-1 Trulicity  (dulaglutide ) at 0.75mg  weekly - tolerating medication at this time. -Continued metformin  500mg  BID  -Continue with plan to restart  SGLT2-I Jardiance  (empagliflozin ) at 25mg   at future visit.  -Patient educated on purpose, proper use, and potential adverse effects.  -Extensively discussed pathophysiology of diabetes, recommended lifestyle interventions, dietary effects on blood sugar control.

## 2023-08-08 NOTE — Assessment & Plan Note (Signed)
 ASCVD risk - primary high intensity statin indicated.  -Continued atorvastatin  80 mg.

## 2023-08-08 NOTE — Progress Notes (Signed)
 Reviewed and agree with Dr Macky Lower plan.

## 2023-08-18 ENCOUNTER — Ambulatory Visit: Admitting: Student

## 2023-08-18 ENCOUNTER — Encounter: Payer: Self-pay | Admitting: Student

## 2023-08-18 VITALS — BP 162/94 | HR 78 | Ht 60.0 in | Wt 176.8 lb

## 2023-08-18 DIAGNOSIS — E119 Type 2 diabetes mellitus without complications: Secondary | ICD-10-CM | POA: Diagnosis present

## 2023-08-18 DIAGNOSIS — E039 Hypothyroidism, unspecified: Secondary | ICD-10-CM

## 2023-08-18 DIAGNOSIS — I10 Essential (primary) hypertension: Secondary | ICD-10-CM | POA: Diagnosis not present

## 2023-08-18 MED ORDER — LOSARTAN POTASSIUM-HCTZ 100-25 MG PO TABS: 1.0000 | ORAL_TABLET | Freq: Every day | ORAL | 1 refills | Status: DC

## 2023-08-18 NOTE — Assessment & Plan Note (Signed)
-  Recheck TSH at next visit -Adjust Synthroid  as needed

## 2023-08-18 NOTE — Assessment & Plan Note (Signed)
-   A1c in 3 months - Consider Jardiance  if A1c less than 10, and still needs more control - Need for ophthalmology exam, patient understands - Deferred foot exam today - Patient needs pneumococcal vaccine, patient defers today but consider getting it next time

## 2023-08-18 NOTE — Progress Notes (Signed)
    SUBJECTIVE:   CHIEF COMPLAINT / HPI:   T2DM Recently followed with our pharmacy clinic for her diabetes.  Unable to restart Jardiance  until A1c less than 10.  On review of her libre, her GMI is 8.2%, which is significant improvement.  Reports compliance with metformin  500 mg tablet twice daily and Trulicity  0.75 mg weekly.  HTN Recently followed with her pharmacy clinic for her hypertension.  At that time blood pressure was within goal.  Unfortunately blood pressure not at goal today.  Discussed increasing blood pressure medication, patient is agreeable.  Additionally, question if her thyroid  medication may be contributing.  Hypothyroid History of graves disease with radioactive iodine intervention. TSH of 12.84 weeks ago 2/2 medication nonadherence. Based on trend, Synthroid  increased to 137 mcg and refills sent.  On recheck, steep decline in TSH however still is within normal limits.  We question if she may be taking too much Synthroid , however it is too low to check today.  She will follow-up with us  in approximately 4-6 weeks and we will check thyroid  then and make adjustments as necessary.   OBJECTIVE:   BP (!) 162/94   Pulse 78   Ht 5' (1.524 m)   Wt 176 lb 12.8 oz (80.2 kg)   LMP 01/30/2019   SpO2 100%   BMI 34.53 kg/m    General: NAD, pleasant Cardio: RRR, no MRG. Cap Refill <2s. Respiratory: CTAB, normal wob on RA Skin: Warm and dry  ASSESSMENT/PLAN:   Assessment & Plan Type 2 diabetes mellitus without complication, without long-term current use of insulin  (HCC) - A1c in 3 months - Consider Jardiance  if A1c less than 10, and still needs more control - Need for ophthalmology exam, patient understands - Deferred foot exam today - Patient needs pneumococcal vaccine, patient defers today but consider getting it next time Primary hypertension - Not controlled today, increase Hyzaar to 100-25 mg - Repeat BMP next visit Hypothyroidism (acquired) -Recheck TSH at next  visit -Adjust Synthroid  as needed    [name], DO [facility]

## 2023-08-18 NOTE — Assessment & Plan Note (Signed)
-   Not controlled today, increase Hyzaar to 100-25 mg - Repeat BMP next visit

## 2023-08-18 NOTE — Patient Instructions (Signed)
 It was great to see you! Thank you for allowing me to participate in your care!   I recommend that you always bring your medications to each appointment as this makes it easy to ensure we are on the correct medications and helps us  not miss when refills are needed.  Our plans for today:  - Please schedule follow-up for late July or early August - If your blood pressure still high, I will increase your medication.  If your blood pressure was not elevated, we will not make changes today.   Take care and seek immediate care sooner if you develop any concerns. Please remember to show up 15 minutes before your scheduled appointment time!  [name], DO [facility]

## 2023-08-24 ENCOUNTER — Other Ambulatory Visit: Payer: Self-pay | Admitting: Student

## 2023-08-24 DIAGNOSIS — E119 Type 2 diabetes mellitus without complications: Secondary | ICD-10-CM

## 2023-09-18 ENCOUNTER — Other Ambulatory Visit: Payer: Self-pay | Admitting: Family Medicine

## 2023-09-18 ENCOUNTER — Telehealth: Payer: Self-pay

## 2023-09-18 DIAGNOSIS — E119 Type 2 diabetes mellitus without complications: Secondary | ICD-10-CM

## 2023-09-18 NOTE — Telephone Encounter (Signed)
 Contacted the patient and left a voice mail for her to contact us  back so we can schedule her follow up appt.

## 2023-09-18 NOTE — Telephone Encounter (Signed)
-----   Message from [facility] sent at 09/18/2023 12:57 PM EDT ----- Regarding: Needs appt Please schedule appointment for any day after 10/10/2023 for diabetes follow-up with A1C check.  Thanks, [employer]

## 2023-10-12 ENCOUNTER — Other Ambulatory Visit: Payer: Self-pay | Admitting: Student

## 2023-10-13 ENCOUNTER — Encounter: Payer: Self-pay | Admitting: Student

## 2023-10-13 ENCOUNTER — Ambulatory Visit (INDEPENDENT_AMBULATORY_CARE_PROVIDER_SITE_OTHER): Admitting: Student

## 2023-10-13 VITALS — BP 115/77 | HR 93 | Ht 60.0 in | Wt 173.8 lb

## 2023-10-13 DIAGNOSIS — E119 Type 2 diabetes mellitus without complications: Secondary | ICD-10-CM | POA: Diagnosis present

## 2023-10-13 DIAGNOSIS — I10 Essential (primary) hypertension: Secondary | ICD-10-CM

## 2023-10-13 DIAGNOSIS — E039 Hypothyroidism, unspecified: Secondary | ICD-10-CM

## 2023-10-13 LAB — POCT GLYCOSYLATED HEMOGLOBIN (HGB A1C): HbA1c, POC (controlled diabetic range): 8.8 % — AB (ref 0.0–7.0)

## 2023-10-13 MED ORDER — FREESTYLE LIBRE 3 PLUS SENSOR MISC: 11 refills | Status: AC

## 2023-10-13 NOTE — Assessment & Plan Note (Addendum)
 Significant treatment of A1c, 8.8 today down from 11.  Consider increasing Trulicity  versus starting Jardiance .  She would like to make more diet adjustments and recheck in 3 months. - Continue metformin  500 mg twice daily - Continue Trulicity  0.75 mg weekly - Counseled on Optho exam - Follow-up in 3 months, A1c at that time

## 2023-10-13 NOTE — Patient Instructions (Addendum)
 It was great to see you! Thank you for allowing me to participate in your care!   I recommend that you always bring your medications to each appointment as this makes it easy to ensure we are on the correct medications and helps us  not miss when refills are needed.  Our plans for today:  - Continue your current metformin , Trulicity  and Hyzaar. - We are checking some labs today, I will call you if they are abnormal will send you a MyChart message or a letter if they are normal.  If you do not hear about your labs in the next 2 weeks please let us  know.  Take care and seek immediate care sooner if you develop any concerns. Please remember to show up 15 minutes before your scheduled appointment time!  [name], DO [facility]

## 2023-10-13 NOTE — Assessment & Plan Note (Signed)
-   Adherent to Synthroid  137 mcg tablet - TSH today

## 2023-10-13 NOTE — Progress Notes (Cosign Needed Addendum)
    SUBJECTIVE:   CHIEF COMPLAINT / HPI:   Type 2 diabetes  hypertension  hypothyroidism Adherent to her medications, denies side effects.  A1c improved today, her GMI per herlene is on track for A1c of 7-will not make changes at this time per her preference.  She reports increased rest in her life due to her son's incarceration, which caused her to stress eat a few weeks ago.  Plan to recheck TSH as we have recently restarted her Synthroid  medication.  Healthcare maintenance Patient declines pneumococcal vaccine today.  Counseled on benefit.  She will defer for 1 year.  OBJECTIVE:   BP 115/77   Pulse 93   Ht 5' (1.524 m)   Wt 173 lb 12.8 oz (78.8 kg)   LMP 01/30/2019   SpO2 98%   BMI 33.94 kg/m    General: NAD, pleasant Cardio: RRR, no MRG. Cap Refill <2s. Respiratory: CTAB, normal wob on RA Skin: Warm and dry  ASSESSMENT/PLAN:   Assessment & Plan Type 2 diabetes mellitus without complication, without long-term current use of insulin  (HCC) Significant treatment of A1c, 8.8 today down from 11.  Consider increasing Trulicity  versus starting Jardiance .  She would like to make more diet adjustments and recheck in 3 months. - Continue metformin  500 mg twice daily - Continue Trulicity  0.75 mg weekly - Counseled on Optho exam - Follow-up in 3 months, A1c at that time Hypothyroidism (acquired) - Adherent to Synthroid  137 mcg tablet - TSH today Primary hypertension In goal today. - Continue Hyzaar 100-25 mg daily - BMP today  Follow-up recommendations Needs Pap smear, will have cma team follow-up to schedule   [name], DO [facility]

## 2023-10-13 NOTE — Assessment & Plan Note (Signed)
 In goal today. - Continue Hyzaar 100-25 mg daily - BMP today

## 2023-10-14 ENCOUNTER — Ambulatory Visit: Payer: Self-pay | Admitting: Student

## 2023-10-14 ENCOUNTER — Telehealth: Payer: Self-pay

## 2023-10-14 DIAGNOSIS — E039 Hypothyroidism, unspecified: Secondary | ICD-10-CM

## 2023-10-14 LAB — BASIC METABOLIC PANEL WITH GFR
BUN/Creatinine Ratio: 18 (ref 9–23)
BUN: 13 mg/dL (ref 6–24)
CO2: 23 mmol/L (ref 20–29)
Calcium: 9 mg/dL (ref 8.7–10.2)
Chloride: 102 mmol/L (ref 96–106)
Creatinine, Ser: 0.71 mg/dL (ref 0.57–1.00)
Glucose: 145 mg/dL — ABNORMAL HIGH (ref 70–99)
Potassium: 3.4 mmol/L — ABNORMAL LOW (ref 3.5–5.2)
Sodium: 139 mmol/L (ref 134–144)
eGFR: 102 mL/min/1.73 (ref >59)

## 2023-10-14 LAB — TSH: TSH: 5.49 u[IU]/mL — ABNORMAL HIGH (ref 0.450–4.500)

## 2023-10-14 NOTE — Telephone Encounter (Signed)
-----   Message from [name] sent at 10/13/2023  2:10 PM EDT ----- Regarding: Needs Pap smear Hey team,  I let the patient go without scheduling her for a Pap smear, could you please call her and let her know that she needs to schedule this with either us  or her OB/GYN at her earliest convenience.  If she has a preference for a female provider, that is okay and can be scheduled.   Thanks,  [employer]

## 2023-10-14 NOTE — Telephone Encounter (Signed)
 Called patient to schedule pap smear but she stated that she had a hysterectomy done and that she no longer have her cervix.

## 2023-10-15 ENCOUNTER — Telehealth: Payer: Self-pay

## 2023-10-15 ENCOUNTER — Other Ambulatory Visit (HOSPITAL_COMMUNITY): Payer: Self-pay

## 2023-10-15 NOTE — Telephone Encounter (Signed)
 Called patient, confirmed identity.  Discussed elevated TSH.  Patient reports nonadherence to Synthroid , indeterminate amount of time.  She will restart her Synthroid  medication, we will repeat in 4-6 weeks.  My team will call patient and schedule her for a lab appointment in 4 to 6 weeks.  Future TSH order placed today.

## 2023-10-15 NOTE — Telephone Encounter (Signed)
 Prior authorization submitted for FREESTYLE LIBRE 3 PLUS SENSORS to [employer] via Latent.   Key: [id number]

## 2023-10-16 NOTE — Telephone Encounter (Signed)
 Pharmacy Patient Advocate Encounter  Received notification from [employer] that Prior Authorization for FREESTYLE LIBRE 3 PLUS SENSORS has been DENIED.  Full denial letter will be uploaded to the media tab. See denial reason below.    PA #/Case ID/Reference #: [id number]

## 2023-10-17 NOTE — Telephone Encounter (Signed)
 Left patient a voicemail to give us  a call back so we can get her scheduled with Dr.Koval.

## 2023-10-20 ENCOUNTER — Other Ambulatory Visit: Payer: Self-pay | Admitting: Student

## 2023-10-20 DIAGNOSIS — E039 Hypothyroidism, unspecified: Secondary | ICD-10-CM

## 2023-10-21 ENCOUNTER — Encounter: Payer: Self-pay | Admitting: Student

## 2023-10-23 ENCOUNTER — Encounter: Payer: Self-pay | Admitting: Pharmacist

## 2023-10-23 ENCOUNTER — Ambulatory Visit: Admitting: Pharmacist

## 2023-10-23 VITALS — BP 133/79 | HR 91 | Wt 178.0 lb

## 2023-10-23 DIAGNOSIS — E785 Hyperlipidemia, unspecified: Secondary | ICD-10-CM | POA: Diagnosis not present

## 2023-10-23 DIAGNOSIS — E1169 Type 2 diabetes mellitus with other specified complication: Secondary | ICD-10-CM | POA: Diagnosis not present

## 2023-10-23 DIAGNOSIS — E119 Type 2 diabetes mellitus without complications: Secondary | ICD-10-CM

## 2023-10-23 DIAGNOSIS — I10 Essential (primary) hypertension: Secondary | ICD-10-CM

## 2023-10-23 NOTE — Assessment & Plan Note (Signed)
 ASCVD risk - primary prevention in patient with diabetes. Last LDL is 137 not at goal of <29 mg/dL. ASCVD risk factors include DM, hypertension, HLD and 10-year ASCVD risk score of 7.9%.  - Continue atorvastatin  80 mg daily

## 2023-10-23 NOTE — Assessment & Plan Note (Signed)
 Diabetes longstanding currently uncontrolled, but with increasing control. Patient is able to verbalize appropriate hypoglycemia management plan. Medication adherence appears okay, taking Trulicity  (dulaglutide ), but not adherent to metformin . Control is suboptimal due to recent stress in life, and medication nonadherence.  - Continue Trulicity  (dulaglutide ) 0.75 mg once weekly; discussed increasing Trulicity  (dulaglutide ), but not willing to at this point in time  - Discussed potential addition of SGLT2i in the future in collaboration with Dr. Howell - Discontinued metformin  XR due to GI intolerance -Patient educated on purpose, proper use, and potential adverse effects.  -Extensively discussed pathophysiology of diabetes, recommended lifestyle interventions, dietary effects on blood sugar control.  -Counseled on s/sx of and management of hypoglycemia.  -Next A1c anticipated 3 months.  - Provided 8 Libre sensors for patient and provided assistance in application of new sensor, as prior sensor had application error, newly placed sensor had repeated error, discussed calling Tour manager

## 2023-10-23 NOTE — Progress Notes (Signed)
 S:     Chief Complaint  Patient presents with   Medication Management    DM   53 y.o. female who presents for diabetes evaluation, education, and management. Patient arrives in good spirits and presents without  any assistance.Reports sugars have been out of range due to a lot of stress recently. 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   Patient was referred and last seen by Primary Care Provider, Dr. Howell, on 10/13/2023.  At last visit, continued all medications.   PMH is significant for T2DM, hypertension, hyperlipidemia, hypothyroidism.  Patient reports Diabetes was diagnosed in 2020.   Current diabetes medications include: Trulicity  (dulaglutide ) 0.75 mg once weekly, metformin  XR BID (taking only 2x per week due to diarrhea) Current hypertension medications include: losartan -hydrochlorothiazide  100-25 mg daily Current hyperlipidemia medications include: atorvastatin  80 daily  Patient denies adherence with medications, particularly with metformin , reports taking only 1 pill in the AM 2x weekly due to GI intolerance.   Do you feel that your medications are working for you? yes Have you been experiencing any side effects to the medications prescribed? Yes - Metformin  causes GI upset Do you have any problems obtaining medications due to transportation or finances? no Insurance coverage: [place]   Patient denies hypoglycemic events.  Patient reported dietary habits:  Snacks: Working on cutting out sugar (ice cream)  Drinks: Olipop probiotic sodas (strawberry & grape flavors)  O:   Review of Systems  Gastrointestinal:  Positive for diarrhea.  All other systems reviewed and are negative.   Physical Exam Vitals reviewed.  Constitutional:      Appearance: Normal appearance. She is normal weight.  Neurological:     Mental Status: She is alert.    7 day average blood glucose: 189  Libre3 CGM Download today  % Time CGM is active:  82% Average Glucose: 189 mg/dL Glucose Management Indicator: 7.8  Glucose Variability: 19.2% (goal <36%) Time in Goal:  - Time in range 70-180: 46% - Time above range: 54% - Time below range: 0% Observed patterns:  Lab Results  Component Value Date   HGBA1C 8.8 (A) 10/13/2023   Vitals:   10/23/23 1614  BP: 135/83  Pulse: 92  SpO2: 100%    Lipid Panel     Component Value Date/Time   CHOL 205 (H) 03/26/2023 0911   TRIG 107 03/26/2023 0911   HDL 49 03/26/2023 0911   CHOLHDL 4.2 03/26/2023 0911   CHOLHDL 3.3 05/04/2015 0853   VLDL 10 05/04/2015 0853   LDLCALC 137 (H) 03/26/2023 0911    Clinical Atherosclerotic Cardiovascular Disease (ASCVD): No  The 10-year ASCVD risk score (Arnett DK, et al., 2019) is: 13.9%   Values used to calculate the score:     Age: 50 years     Clincally relevant sex: Female     Is Non-Hispanic African American: Yes     Diabetic: Yes     Tobacco smoker: No     Systolic Blood Pressure: 135 mmHg     Is BP treated: Yes     HDL Cholesterol: 49 mg/dL     Total Cholesterol: 205 mg/dL   Patient is participating in a Managed Medicaid Plan:  Yes   A/P: Diabetes longstanding currently uncontrolled, but with increasing control. Patient is able to verbalize appropriate hypoglycemia management plan. Medication adherence appears okay, taking Trulicity  (dulaglutide ), but not adherent to metformin . Control is suboptimal due to recent stress in life, and medication nonadherence.  -  Continue Trulicity  (dulaglutide ) 0.75 mg once weekly; discussed increasing Trulicity  (dulaglutide ), but not willing to at this point in time  - Discussed potential addition of SGLT2i in the future in collaboration with Dr. Howell - Discontinued metformin  XR due to GI intolerance (added to allergy/intolerance list) -Patient educated on purpose, proper use, and potential adverse effects.  -Extensively discussed pathophysiology of diabetes, recommended lifestyle interventions, dietary  effects on blood sugar control.  -Counseled on s/sx of and management of hypoglycemia.  -Next A1c anticipated 3 months.  - Provided 8 Libre sensors for patient and provided assistance in application of new sensor, as prior sensor had application error, newly placed sensor had repeated error, discussed calling Tour manager.  ASCVD risk - primary prevention in patient with diabetes. Last LDL is 137 not at goal of <29 mg/dL. ASCVD risk factors include DM, hypertension, HLD and 10-year ASCVD risk score of 7.9%.  - Continue atorvastatin  80 mg daily  Hypertension longstanding currently mildly uncontrolled. Blood pressure goal of <130/80 mmHg. Medication adherence good. Blood pressure control is suboptimal due to life stressors. - Continue losartan -hydrochlorothiazide  100-25 mg daily  Written patient instructions provided. Patient verbalized understanding of treatment plan.  Total time in face to face counseling 37 minutes.    Follow-up:  Pharmacist visit in 3 months PCP clinic visit in 3 weeks pending new job  Patient seen with Fonda Blase, PharmD Candidate - PY3 student and Estefana Blase, PharmD Candidate - PY4 student.

## 2023-10-23 NOTE — Patient Instructions (Signed)
 It was nice to see you today!  Your goal blood sugar is 80-130 before eating and less than 180 after eating.  Medication Changes:  Discontinue metformin   Continue all other medication the same.   Keep up the good work with diet and exercise. Aim for a diet full of vegetables, fruit and lean meats (chicken, [place], fish). Try to limit salt intake by eating fresh or frozen vegetables (instead of canned), rinse canned vegetables prior to cooking and do not add any additional salt to meals.

## 2023-10-23 NOTE — Assessment & Plan Note (Signed)
 Hypertension longstanding currently mildly uncontrolled. Blood pressure goal of <130/80 mmHg. Medication adherence good. Blood pressure control is suboptimal due to life stressors. - Continue losartan -hydrochlorothiazide  100-25 mg daily

## 2023-10-24 NOTE — Progress Notes (Signed)
 Reviewed and agree with Dr Rennis plan.

## 2023-11-10 ENCOUNTER — Other Ambulatory Visit: Payer: Self-pay | Admitting: Student

## 2023-11-10 DIAGNOSIS — E119 Type 2 diabetes mellitus without complications: Secondary | ICD-10-CM

## 2023-12-10 ENCOUNTER — Other Ambulatory Visit: Payer: Self-pay | Admitting: Student

## 2024-01-05 ENCOUNTER — Other Ambulatory Visit: Payer: Self-pay | Admitting: Student

## 2024-01-05 DIAGNOSIS — E119 Type 2 diabetes mellitus without complications: Secondary | ICD-10-CM

## 2024-01-07 ENCOUNTER — Other Ambulatory Visit: Payer: Self-pay | Admitting: Student

## 2024-01-19 ENCOUNTER — Ambulatory Visit: Admitting: Student

## 2024-01-19 VITALS — BP 131/82 | HR 74 | Ht 60.0 in | Wt 184.0 lb

## 2024-01-19 DIAGNOSIS — I10 Essential (primary) hypertension: Secondary | ICD-10-CM

## 2024-01-19 DIAGNOSIS — F4320 Adjustment disorder, unspecified: Secondary | ICD-10-CM | POA: Diagnosis not present

## 2024-01-19 DIAGNOSIS — E119 Type 2 diabetes mellitus without complications: Secondary | ICD-10-CM

## 2024-01-19 DIAGNOSIS — E039 Hypothyroidism, unspecified: Secondary | ICD-10-CM

## 2024-01-19 LAB — POCT GLYCOSYLATED HEMOGLOBIN (HGB A1C): HbA1c, POC (controlled diabetic range): 10.1 % — AB (ref 0.0–7.0)

## 2024-01-19 MED ORDER — TRULICITY 1.5 MG/0.5ML ~~LOC~~ SOAJ: 1.5000 mg | SUBCUTANEOUS | 1 refills | Status: AC

## 2024-01-19 NOTE — Patient Instructions (Signed)
 It was great to see you! Thank you for allowing me to participate in your care!   I recommend that you always bring your medications to each appointment as this makes it easy to ensure we are on the correct medications and helps us  not miss when refills are needed.  Our plans for today:  - Please take 1.5 mg Trulicity  weekly - Please take your Synthroid  every day for the next 6 weeks - Please take your blood pressure medication every day - We will check labs in 6 weeks   Therapy and Counseling Resources Most providers on this list will take Medicaid. Patients with commercial insurance or Medicare should contact their insurance company to get a list of in network providers.  [facility] (takes children) Location 1: [address] [place], [place] [address] Location 2: [address] [place], [place] [address] [phone]   [facility] (spanish speaking therapist available)(habla espanol)(take medicare and medicaid)  [address], [place], [place] [address], [place]  [email or web address] [phone]  [facility] Counseling [address] [place], [place] [address] [phone]  [facility]   [address], Suite [place], [place] [address]      [phone]  [facility] (spanish available) [address]  [place], [place] [address] [phone]  [facility] (take [facility] and medicare) [address]  [place], [place] [address]       [phone]     [facility] (virtual only) [phone]  [facility] [address] [place], [place] [phone]  [facility]:  [address] [place] [phone]  [facility]:  [address] [place] [phone]  [facility] (under & uninsured) [address]   [place] [phone]    [email or web address]    [facility] [address]  [place]    [phone]  [facility] -[address]     Phone:  [phone]     [facility]-  [address]  [phone]   [facility] [address]      [place], [place] [phone]  [facility]: [address] [place], [place], [place]  [phone]   [facility] (Spanish therapist) [email or web address]  [address] [place]  [address]   [place] [address]    [phone]    [facility]   [address],  [place], [place]  [phone]   [facility]  [address] [place]  [phone]  [facility]  [address] [place], [place]        [phone]  Open Access/Walk In Clinic under & uninsured  [facility]  [address] [place], [place] [phone] Crisis [phone]  [facility] [address],  (Spanish)   [address] , [place]: [phone]) 8:30 - 12; 1 - 2:30  [facility] [employer],  [address] [place], [place]    [phone]):8:30 - 12; 2 - 3PM  [facility] [employer],  [address],  [place]; [phone]):   Mon - Fri 8 AM - 5 PM  [facility] [address] [place]  MWF 12:30 to 3:00 or call to schedule an appointment  [phone]  Specific Provider options Psychology Today  [email or web address]/us  click  on find a therapist  enter your zip code left side and select or tailor a therapist for your specific need.   [facility] Provider Directory [email or web address]  (Medicaid)   Follow all drop down to find a provider  Social Support program [facility] [place] [phone] or photosolver.pl [address], [place], [place] Recovery support and educational   24- Hour Availability:   [facility]  [address] [place], [place] [phone] Crisis [phone]  [facility] [phone]  [facility]  [phone]   [facility]  [phone] (after hours)  Therapeutic Alternative/Mobile Crisis   [phone]  [place]  National Suicide Hotline  303-474-5629 MERRILYN)  Call 911 or go to emergency room  [facility] [place]  [phone]);  [place]  [phone]); [place], [place], [place], [place], [place], [place], [place]   Take care and seek immediate care sooner if you develop any concerns. Please remember to show up 15 minutes before your scheduled appointment time!  [name], DO [facility]

## 2024-01-19 NOTE — Assessment & Plan Note (Signed)
-  Not adherent to Synthroid  - Continue 137 mcg Synthroid , recheck TSH in 6 weeks

## 2024-01-19 NOTE — Assessment & Plan Note (Signed)
-  In goal on recheck - Continue current medications

## 2024-01-19 NOTE — Assessment & Plan Note (Addendum)
-   Increase Trulicity  to 1.5 mg weekly - Intolerant of metformin , does not want to start insulin  at this time - Follow-up in 6 weeks to check GMI on libre - Patient participating in [place] study with our pharmacy clinic, recommend follow-up with them in 1 to 2 weeks

## 2024-01-19 NOTE — Progress Notes (Signed)
    SUBJECTIVE:   CHIEF COMPLAINT / HPI:   Discussed the use of AI scribe software for clinical note transcription with the patient, who gave verbal consent to proceed.  History of Present Illness Valerie Bauer is a 53 year old female with diabetes and hypertension who presents with concerns about weight gain and medication adherence.  Weight gain and mood disturbance - Recent weight gain attributed to stress and grief since early fall - Dissatisfaction with current weight - Prolonged low mood described as being in a 'funk' - Significant ongoing stress related to son's incarceration and brother's recent death from a heart attack on his birthday - Anniversary of mother's death from breast cancer contributing to emotional distress - Perception that stress and mood disturbance worsen weight gain and ability to manage health while working from home  Diabetes mellitus and medication adherence - Struggles with medication adherence - Takes Trulicity  0.75 mg weekly as prescribed - Avoids metformin  due to gastrointestinal side effects that interfere with 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 Respiratory: CTAB, normal wob on RA Skin: Warm and dry  ASSESSMENT/PLAN:   Assessment & Plan Type 2 diabetes mellitus without complication, without long-term current use of insulin  (HCC) - Increase Trulicity  to 1.5 mg weekly - Intolerant of metformin , does not want to start  insulin  at this time - Follow-up in 6 weeks to check GMI on libre - Patient participating in [place] study with our pharmacy clinic, recommend follow-up with them in 1 to 2 weeks Primary hypertension -In goal on recheck - Continue current medications Hypothyroidism (acquired) -Not adherent to Synthroid  - Continue 137 mcg Synthroid , recheck TSH in 6 weeks Grief reaction - Counseling resources provided - Encouraged patient to follow-up with me or counselor to discuss further     [name], DO [facility]

## 2024-02-06 ENCOUNTER — Telehealth: Payer: Self-pay | Admitting: Pharmacist

## 2024-02-06 NOTE — Telephone Encounter (Signed)
 Attempted to contact patient for follow-up of Liberate study, need to discuss sensors  and request to reschedule.  Left HIPAA compliant voice mail requesting call back to  My direct phone: [phone]  Total time with patient call and documentation of interaction: 6 minutes.
